# Patient Record
Sex: Female | Born: 1980 | Race: White | Hispanic: No | Marital: Married | Smoking: Never smoker
Health system: Southern US, Community
[De-identification: ages and names within clinical notes are randomized; demographics above are authoritative.]

## PROBLEM LIST (undated history)

## (undated) DIAGNOSIS — Z8619 Personal history of other infectious and parasitic diseases: Secondary | ICD-10-CM

## (undated) DIAGNOSIS — J45909 Unspecified asthma, uncomplicated: Secondary | ICD-10-CM

## (undated) DIAGNOSIS — Z87891 Personal history of nicotine dependence: Secondary | ICD-10-CM

## (undated) DIAGNOSIS — J189 Pneumonia, unspecified organism: Secondary | ICD-10-CM

## (undated) HISTORY — PX: WISDOM TOOTH EXTRACTION: SHX21

## (undated) HISTORY — DX: Unspecified asthma, uncomplicated: J45.909

## (undated) HISTORY — PX: NO PAST SURGERIES: SHX2092

## (undated) HISTORY — DX: Personal history of other infectious and parasitic diseases: Z86.19

---
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
## 2002-04-21 ENCOUNTER — Other Ambulatory Visit: Admission: RE | Admit: 2002-04-21 | Discharge: 2002-04-21 | Payer: Self-pay | Admitting: Obstetrics and Gynecology

## 2003-05-25 ENCOUNTER — Other Ambulatory Visit: Admission: RE | Admit: 2003-05-25 | Discharge: 2003-05-25 | Payer: Self-pay | Admitting: Obstetrics and Gynecology

## 2004-08-01 ENCOUNTER — Other Ambulatory Visit: Admission: RE | Admit: 2004-08-01 | Discharge: 2004-08-01 | Payer: Self-pay | Admitting: Obstetrics and Gynecology

## 2005-07-05 ENCOUNTER — Other Ambulatory Visit: Admission: RE | Admit: 2005-07-05 | Discharge: 2005-07-05 | Payer: Self-pay | Admitting: Obstetrics and Gynecology

## 2006-03-18 DIAGNOSIS — Z87891 Personal history of nicotine dependence: Secondary | ICD-10-CM

## 2006-03-18 HISTORY — DX: Personal history of nicotine dependence: Z87.891

## 2008-02-08 ENCOUNTER — Ambulatory Visit (HOSPITAL_COMMUNITY): Admission: RE | Admit: 2008-02-08 | Discharge: 2008-02-08 | Payer: Self-pay | Admitting: Obstetrics and Gynecology

## 2008-06-13 ENCOUNTER — Inpatient Hospital Stay (HOSPITAL_COMMUNITY): Admission: AD | Admit: 2008-06-13 | Discharge: 2008-06-13 | Payer: Self-pay | Admitting: Obstetrics and Gynecology

## 2008-08-27 ENCOUNTER — Inpatient Hospital Stay (HOSPITAL_COMMUNITY): Admission: AD | Admit: 2008-08-27 | Discharge: 2008-08-30 | Payer: Self-pay | Admitting: Obstetrics & Gynecology

## 2010-04-08 ENCOUNTER — Encounter: Payer: Self-pay | Admitting: Orthopaedic Surgery

## 2010-06-25 LAB — CBC
HCT: 34.5 % — ABNORMAL LOW (ref 36.0–46.0)
HCT: 38.4 % (ref 36.0–46.0)
Hemoglobin: 12.2 g/dL (ref 12.0–15.0)
Hemoglobin: 13.5 g/dL (ref 12.0–15.0)
MCHC: 35.1 g/dL (ref 30.0–36.0)
MCHC: 35.4 g/dL (ref 30.0–36.0)
MCV: 94 fL (ref 78.0–100.0)
MCV: 94.5 fL (ref 78.0–100.0)
Platelets: 166 10*3/uL (ref 150–400)
RBC: 3.65 MIL/uL — ABNORMAL LOW (ref 3.87–5.11)
RDW: 13.3 % (ref 11.5–15.5)
WBC: 12.2 10*3/uL — ABNORMAL HIGH (ref 4.0–10.5)
WBC: 14.2 10*3/uL — ABNORMAL HIGH (ref 4.0–10.5)

## 2010-06-25 LAB — RPR: RPR Ser Ql: NONREACTIVE

## 2010-06-25 LAB — CCBB MATERNAL DONOR DRAW

## 2010-06-28 LAB — URINALYSIS, ROUTINE W REFLEX MICROSCOPIC
Glucose, UA: NEGATIVE mg/dL
Hgb urine dipstick: NEGATIVE
Ketones, ur: NEGATIVE mg/dL
Protein, ur: NEGATIVE mg/dL
Specific Gravity, Urine: <1.005 — ABNORMAL LOW (ref 1.005–1.030)
Urobilinogen, UA: 0.2 mg/dL (ref 0.0–1.0)
pH: 6 (ref 5.0–8.0)

## 2010-07-30 ENCOUNTER — Inpatient Hospital Stay (HOSPITAL_COMMUNITY): Admission: AD | Admit: 2010-07-30 | Payer: Self-pay | Source: Ambulatory Visit | Admitting: Obstetrics and Gynecology

## 2010-08-02 ENCOUNTER — Inpatient Hospital Stay (HOSPITAL_COMMUNITY)
Admission: RE | Admit: 2010-08-02 | Discharge: 2010-08-04 | DRG: 775 | Disposition: A | Discharge status: Home / Self Care | Payer: PRIVATE HEALTH INSURANCE | Source: Ambulatory Visit | Attending: Obstetrics and Gynecology | Admitting: Obstetrics and Gynecology

## 2010-08-02 DIAGNOSIS — Z22330 Carrier of Group B streptococcus: Secondary | ICD-10-CM

## 2010-08-02 DIAGNOSIS — O480 Post-term pregnancy: Principal | ICD-10-CM | POA: Diagnosis present

## 2010-08-02 DIAGNOSIS — O99892 Other specified diseases and conditions complicating childbirth: Secondary | ICD-10-CM | POA: Diagnosis present

## 2010-08-02 LAB — CBC
HCT: 36.7 % (ref 36.0–46.0)
Hemoglobin: 12 g/dL (ref 12.0–15.0)
MCH: 30.4 pg (ref 26.0–34.0)
MCHC: 32.7 g/dL (ref 30.0–36.0)
RBC: 3.95 MIL/uL (ref 3.87–5.11)
RDW: 14.1 % (ref 11.5–15.5)
WBC: 10.4 10*3/uL (ref 4.0–10.5)

## 2010-08-02 LAB — RPR: RPR Ser Ql: NONREACTIVE

## 2010-08-03 LAB — CBC
HCT: 37.7 % (ref 36.0–46.0)
Hemoglobin: 12.1 g/dL (ref 12.0–15.0)
MCH: 29.9 pg (ref 26.0–34.0)
MCHC: 32.1 g/dL (ref 30.0–36.0)
Platelets: 256 10*3/uL (ref 150–400)
RBC: 4.05 MIL/uL (ref 3.87–5.11)
RDW: 14.1 % (ref 11.5–15.5)

## 2010-08-04 NOTE — Discharge Summary (Signed)
  Rachel Singleton, Rachel Singleton               ACCOUNT NO.:  [id number]  MEDICAL RECORD NO.:  [id number]           PATIENT TYPE:  I  LOCATION:  9145                          FACILITY:  [facility]  PHYSICIAN:  Gerrit Friends. Aldona Bar, M.D.   DATE OF BIRTH:  02/15/1981  DATE OF ADMISSION:  08/02/2010 DATE OF DISCHARGE:  08/04/2010                              DISCHARGE SUMMARY   DISCHARGE DIAGNOSES: 1. Term pregnancy delivered 8 pounds and 3 ounces female infant,     Apgars 6 and 8. 2. Blood type A+.  PROCEDURE:  Normal spontaneous delivery.  SUMMARY:  This gravida 2, now para 2, presented at Term and Labor, and progressed well, and subsequently had a normal spontaneous delivery of a viable female infant weighing 8 pounds and 3 ounces with Apgars of 6 and 8.  There were no tears.  Postpartum course was totally benign.  On the morning of Aug 04, 2010, the patient was ambulating well, breastfeeding without difficulty, having normal bowel and bladder function, was afebrile and was desirous of discharge.  Accordingly, she was given all appropriate instructions per discharge brochure and understood all instructions well.  Discharge medications include vitamins - one a day as long as she is breastfeeding.  Her hemoglobin was 12.1 with a white count of 14,100 and a platelet count of 256,000.  She was given prescriptions for Motrin 600 mg use every 6 hours as needed for cramping or mild pain and Tylox one to two every 4-6 hours as needed for more severe pain.  She will return to the office for followup in approximately 4 weeks' time or as needed.  CONDITION ON DISCHARGE:  Improved.     Gerrit Friends. Aldona Bar, M.D.     RMW/MEDQ  D:  08/04/2010  T:  08/04/2010  Job:  [id number]  Electronically Signed by Annamaria Helling M.D. on 08/04/2010 03:05:14 PM

## 2011-02-02 ENCOUNTER — Emergency Department (HOSPITAL_COMMUNITY): Payer: Worker's Compensation

## 2011-02-02 ENCOUNTER — Emergency Department (HOSPITAL_COMMUNITY)
Admission: EM | Admit: 2011-02-02 | Discharge: 2011-02-02 | Disposition: A | Discharge status: Home / Self Care | Payer: Worker's Compensation | Attending: Emergency Medicine | Admitting: Emergency Medicine

## 2011-02-02 ENCOUNTER — Encounter: Payer: Self-pay | Admitting: *Deleted

## 2011-02-02 DIAGNOSIS — Y9269 Other specified industrial and construction area as the place of occurrence of the external cause: Secondary | ICD-10-CM | POA: Insufficient documentation

## 2011-02-02 DIAGNOSIS — T148XXA Other injury of unspecified body region, initial encounter: Secondary | ICD-10-CM

## 2011-02-02 DIAGNOSIS — L089 Local infection of the skin and subcutaneous tissue, unspecified: Secondary | ICD-10-CM | POA: Insufficient documentation

## 2011-02-02 DIAGNOSIS — Y990 Civilian activity done for income or pay: Secondary | ICD-10-CM | POA: Insufficient documentation

## 2011-02-02 DIAGNOSIS — W268XXA Contact with other sharp object(s), not elsewhere classified, initial encounter: Secondary | ICD-10-CM | POA: Insufficient documentation

## 2011-02-02 MED ORDER — CLINDAMYCIN HCL 150 MG PO CAPS: 300.0000 mg | ORAL_CAPSULE | Freq: Three times a day (TID) | ORAL | Status: AC

## 2011-02-02 MED ORDER — TETANUS-DIPHTH-ACELL PERTUSSIS 5-2.5-18.5 LF-MCG/0.5 IM SUSP
0.5000 mL | Freq: Once | INTRAMUSCULAR | Status: AC
  Administered 2011-02-02: 0.5 mL via INTRAMUSCULAR
  Filled 2011-02-02: qty 0.5

## 2011-02-02 NOTE — ED Notes (Signed)
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 Wound healed over, redness and swelling present. Painful with palpation

## 2011-02-02 NOTE — ED Provider Notes (Signed)
History/physical exam/procedure(s) were performed by non-physician practitioner and as supervising physician I was immediately available for consultation/collaboration. I have reviewed all notes and am in agreement with care and plan.   Hilario Quarry, MD 02/02/11 [phone]

## 2011-02-02 NOTE — ED Provider Notes (Signed)
History     CSN: [id number] Arrival date & time: 02/02/2011 10:27 AM   First MD Initiated Contact with Patient 02/02/11 1112      Chief Complaint  Patient presents with  . Extremity Laceration    to left hand x1 week ago - pt believes glass may be in wound and it is now infected    (Consider location/radiation/quality/duration/timing/severity/associated sxs/prior treatment) The history is provided by the patient.   Rachel Singleton is a 30 year old female who presents with complaint of pain and swelling to the left palm. One week ago, she sustained a laceration to the same area when a beer mug broke in her hand. She cleaned the wound at that time but it has become progressively red and more painful. Pain does not radiate anywhere.It is throbbing in nature, moderate in severity. It is worse with palpation of the affected area. THere is no associated numbness, tingling, or weakness. There is no bleeding. The patient denies fever, chills, or a general feeling of being unwell. Her tetanus it out of date.  History reviewed. No pertinent past medical history.  History reviewed. No pertinent past surgical history.  History reviewed. No pertinent family history.  History  Substance Use Topics  . Smoking status: Not on file  . Smokeless tobacco: Not on file  . Alcohol Use: No     Review of Systems  Constitutional: Negative for fever and chills.  Skin: Positive for color change and wound. Negative for rash.  Neurological: Negative for weakness, numbness and headaches.  Hematological: Does not bruise/bleed easily.  All other systems reviewed and are negative.    Allergies  Review of patient's allergies indicates no known allergies.  Home Medications   Current Outpatient Rx  Name Route Sig Dispense Refill  . THERA M PLUS PO TABS Oral Take 1 tablet by mouth daily.        BP 118/81  Pulse 78  Temp(Src) 97.9 F (36.6 C) (Oral)  Resp 16  SpO2 100%  Physical Exam    Constitutional: She is oriented to person, place, and time. She appears well-developed and well-nourished. No distress.  HENT:  Head: Normocephalic and atraumatic.  Eyes: Pupils are equal, round, and reactive to light.  Neck: Normal range of motion. Neck supple.  Cardiovascular: Normal rate, regular rhythm and intact distal pulses.   Pulmonary/Chest: Effort normal and breath sounds normal.  Abdominal: Soft. She exhibits no distension. There is no tenderness.  Musculoskeletal: Normal range of motion. She exhibits no edema.       Moderate TTP to palmar aspect of left hand, proximal to index finger.  Neurological: She is alert and oriented to person, place, and time. No cranial nerve deficit.  Skin: Skin is warm and dry.       1.5cm area of erythema with healed wound to palmar aspect of left hand, proximal to index finger. No purulent drainage, no erythematous streaking up affected extremity.    ED Course  Procedures (including critical care time)  Labs Reviewed - No data to display Dg Hand Complete Left  02/02/2011  *RADIOLOGY REPORT*  Clinical Data: Laceration 1 week ago.  Question foreign body.  LEFT HAND - COMPLETE 3+ VIEW  Comparison: None.  Findings: No radiopaque foreign body is identified.  No fracture or dislocation is seen.  No soft tissue gas collection.  IMPRESSION: Negative for foreign body.  No acute finding.  Original Report Authenticated By: Bernadene Bell. D'ALESSIO, M.D.     1. Wound infection  MDM  Patient's x-rays and reviewed by myself there is no evidence of any foreign body. As the wound has already healed, I will not explore for foreign body. I do feel the patient has a superficial wound infection and cover for both staph and strep we'll treat her with clindamycin. Her tetanus shot was updated at this visit.        Rachel Singleton, [place] 02/02/11 1328

## 2011-02-02 NOTE — Discharge Instructions (Signed)
Wound Infection A wound infection happens when a type of germ (bacteria) starts growing in the wound. In some cases, this can cause the wound to break open. If cared for properly, the infected wound will heal from the inside to the outside. Wound infections need treatment. CAUSES An infection is caused by bacteria growing in the wound.  SYMPTOMS   Increase in redness, swelling, or pain at the wound site.   Increase in drainage at the wound site.   Wound or bandage (dressing) starts to smell bad.   Fever.   Feeling tired or fatigued.   Pus draining from the wound.  TREATMENT  You caregiver will prescribe antibiotic medicine. The wound infection should improve within 24 to 48 hours. Any redness around the wound should stop spreading and the wound should be less painful.  HOME CARE INSTRUCTIONS   Only take over-the-counter or prescription medicines for pain, discomfort, or fever as directed by your caregiver.   Take your antibiotics as directed. Finish them even if you start to feel better.   Gently wash the area with mild soap and water 2 times a day, or as directed. Rinse off the soap. Pat the area dry with a clean towel. Do not rub the wound. This may cause bleeding.   Follow your caregiver's instructions for how often you need to change the dressing.   Apply ointment and a dressing to the wound as directed.   If the dressing sticks, moisten it with soapy water and gently remove it.   Change the bandage right away if it becomes wet, dirty, or develops a bad smell.   Take showers. Do not take tub baths, swim, or do anything that may soak the wound until it is healed.   Avoid exercises that make you sweat heavily.   Use anti-itch medicine as directed by your caregiver. The wound may itch when it is healing. Do not pick or scratch at the wound.   Follow up with your caregiver to get your wound rechecked as directed.  SEEK MEDICAL CARE IF:  You have an increase in swelling,  pain, or redness around the wound.   You have an increase in the amount of pus coming from the wound.   There is a bad smell coming from the wound.   More of the wound breaks open.   You have a fever.  MAKE SURE YOU:   Understand these instructions.   Will watch your condition.   Will get help right away if you are not doing well or get worse.  Document Released: 12/01/2002 Document Revised: 11/14/2010 Document Reviewed: 07/08/2010 [employer] Patient Information 2012 [employer]. 

## 2011-10-14 LAB — OB RESULTS CONSOLE HEPATITIS B SURFACE ANTIGEN: Hepatitis B Surface Ag: NEGATIVE

## 2011-10-14 LAB — OB RESULTS CONSOLE ABO/RH: RH Type: POSITIVE

## 2011-10-14 LAB — OB RESULTS CONSOLE RPR: RPR: NONREACTIVE

## 2011-10-14 LAB — OB RESULTS CONSOLE HIV ANTIBODY (ROUTINE TESTING): HIV: NONREACTIVE

## 2011-10-14 LAB — OB RESULTS CONSOLE RUBELLA ANTIBODY, IGM: Rubella: IMMUNE

## 2011-10-14 LAB — OB RESULTS CONSOLE GC/CHLAMYDIA
Chlamydia: NEGATIVE
Gonorrhea: NEGATIVE

## 2012-03-18 NOTE — L&D Delivery Note (Signed)
Delivery Note At 3:49 PM a viable and healthy female was delivered via Vaginal, Spontaneous Delivery (Presentation: Right ; Occiput Anterior  ).  APGAR: 3, 7; weight 7 lb 9.2 oz (3435 g).   Placenta status: Intact, Spontaneous with partial abruption.  Cord: 3 vessels with the following complications: Velamentous cord insertion.  Cord pH: 7.13  I was called to see patient d/t fetal bradycardia.  Fetal heart tones declined to the 60s and did not recover despite position changes and supplemental oxygen.  The patients cervix  was initially 9 then quickly 10.  A kiwi vacuum was applied and with maternal pushing the infants head rotated from OP to OA and with 3 pushes was delivered to crowning.  The vacuum was disengaged and the infants body was delivered.  Bleeding c/w abruption was noted.  The infant was passed to the isolette and The NICU team was in attendance for resuscitation.  The Placenta delivered spontaneously.  A velamentous cord insertion with partial placental abruption was noted.  Cord pH was sent. No lacerations required repair.  Mother and baby are doing well after delivery.  Anesthesia: Epidural  Episiotomy: None Lacerations: None Suture Repair: None Est. Blood Loss (mL): 500cc  Mom to postpartum.  Baby to nursery-stable.  Rachel Kingsley H. 05/08/2012, 4:24 PM

## 2012-04-10 LAB — OB RESULTS CONSOLE GBS: GBS: NEGATIVE

## 2012-04-30 ENCOUNTER — Telehealth (HOSPITAL_COMMUNITY): Payer: Self-pay | Admitting: *Deleted

## 2012-04-30 ENCOUNTER — Encounter (HOSPITAL_COMMUNITY): Payer: Self-pay | Admitting: *Deleted

## 2012-04-30 NOTE — Telephone Encounter (Signed)
Preadmission screen  

## 2012-05-08 ENCOUNTER — Inpatient Hospital Stay (HOSPITAL_COMMUNITY): Payer: BC Managed Care – PPO | Admitting: Anesthesiology

## 2012-05-08 ENCOUNTER — Encounter (HOSPITAL_COMMUNITY): Payer: Self-pay | Admitting: Anesthesiology

## 2012-05-08 ENCOUNTER — Encounter (HOSPITAL_COMMUNITY): Payer: Self-pay

## 2012-05-08 ENCOUNTER — Inpatient Hospital Stay (HOSPITAL_COMMUNITY)
Admission: RE | Admit: 2012-05-08 | Discharge: 2012-05-10 | DRG: 372 | Disposition: A | Discharge status: Home / Self Care | Payer: BC Managed Care – PPO | Source: Ambulatory Visit | Attending: Obstetrics and Gynecology | Admitting: Obstetrics and Gynecology

## 2012-05-08 DIAGNOSIS — O4590 Premature separation of placenta, unspecified, unspecified trimester: Secondary | ICD-10-CM | POA: Diagnosis present

## 2012-05-08 LAB — CBC
HCT: 33.9 % — ABNORMAL LOW (ref 36.0–46.0)
MCH: 30.5 pg (ref 26.0–34.0)
MCHC: 33.3 g/dL (ref 30.0–36.0)
MCV: 91.6 fL (ref 78.0–100.0)
Platelets: 204 10*3/uL (ref 150–400)
RDW: 14.3 % (ref 11.5–15.5)
WBC: 7.6 10*3/uL (ref 4.0–10.5)

## 2012-05-08 LAB — TYPE AND SCREEN: Antibody Screen: NEGATIVE

## 2012-05-08 LAB — ABO/RH: ABO/RH(D): A POS

## 2012-05-08 MED ORDER — OXYTOCIN 40 UNITS IN LACTATED RINGERS INFUSION - SIMPLE MED: 62.5000 mL/h | INTRAVENOUS | Status: DC

## 2012-05-08 MED ORDER — ONDANSETRON HCL 4 MG/2ML IJ SOLN: 4.0000 mg | INTRAMUSCULAR | Status: DC | PRN

## 2012-05-08 MED ORDER — SENNOSIDES-DOCUSATE SODIUM 8.6-50 MG PO TABS
2.0000 | ORAL_TABLET | Freq: Every day | ORAL | Status: DC
  Administered 2012-05-08: 2 via ORAL

## 2012-05-08 MED ORDER — EPHEDRINE 5 MG/ML INJ: 10.0000 mg | INTRAVENOUS | Status: DC | PRN

## 2012-05-08 MED ORDER — WITCH HAZEL-GLYCERIN EX PADS: 1.0000 "application " | MEDICATED_PAD | CUTANEOUS | Status: DC | PRN

## 2012-05-08 MED ORDER — ONDANSETRON HCL 4 MG/2ML IJ SOLN: 4.0000 mg | Freq: Four times a day (QID) | INTRAMUSCULAR | Status: DC | PRN

## 2012-05-08 MED ORDER — METHYLERGONOVINE MALEATE 0.2 MG PO TABS: 0.2000 mg | ORAL_TABLET | ORAL | Status: DC | PRN

## 2012-05-08 MED ORDER — ZOLPIDEM TARTRATE 5 MG PO TABS: 5.0000 mg | ORAL_TABLET | Freq: Every evening | ORAL | Status: DC | PRN

## 2012-05-08 MED ORDER — ONDANSETRON HCL 4 MG PO TABS: 4.0000 mg | ORAL_TABLET | ORAL | Status: DC | PRN

## 2012-05-08 MED ORDER — OXYCODONE-ACETAMINOPHEN 5-325 MG PO TABS: 1.0000 | ORAL_TABLET | ORAL | Status: DC | PRN

## 2012-05-08 MED ORDER — EPHEDRINE 5 MG/ML INJ
10.0000 mg | INTRAVENOUS | Status: DC | PRN
  Filled 2012-05-08: qty 4

## 2012-05-08 MED ORDER — LIDOCAINE HCL (PF) 1 % IJ SOLN: 30.0000 mL | INTRAMUSCULAR | Status: DC | PRN

## 2012-05-08 MED ORDER — PHENYLEPHRINE 40 MCG/ML (10ML) SYRINGE FOR IV PUSH (FOR BLOOD PRESSURE SUPPORT)
80.0000 ug | PREFILLED_SYRINGE | INTRAVENOUS | Status: DC | PRN
  Filled 2012-05-08: qty 5

## 2012-05-08 MED ORDER — LIDOCAINE HCL (PF) 1 % IJ SOLN
INTRAMUSCULAR | Status: DC | PRN
  Administered 2012-05-08 (×4): 4 mL

## 2012-05-08 MED ORDER — BUTORPHANOL TARTRATE 1 MG/ML IJ SOLN: 1.0000 mg | INTRAMUSCULAR | Status: DC | PRN

## 2012-05-08 MED ORDER — LACTATED RINGERS IV SOLN
INTRAVENOUS | Status: DC
  Administered 2012-05-08 (×2): via INTRAVENOUS

## 2012-05-08 MED ORDER — AZITHROMYCIN 250 MG PO TABS
250.0000 mg | ORAL_TABLET | Freq: Every day | ORAL | Status: DC
  Administered 2012-05-09: 250 mg via ORAL
  Filled 2012-05-08 (×3): qty 1

## 2012-05-08 MED ORDER — ACETAMINOPHEN 325 MG PO TABS: 650.0000 mg | ORAL_TABLET | ORAL | Status: DC | PRN

## 2012-05-08 MED ORDER — IBUPROFEN 600 MG PO TABS: 600.0000 mg | ORAL_TABLET | Freq: Four times a day (QID) | ORAL | Status: DC | PRN

## 2012-05-08 MED ORDER — BENZOCAINE-MENTHOL 20-0.5 % EX AERO
1.0000 "application " | INHALATION_SPRAY | CUTANEOUS | Status: DC | PRN
  Administered 2012-05-09: 1 via TOPICAL
  Filled 2012-05-08: qty 56

## 2012-05-08 MED ORDER — IBUPROFEN 600 MG PO TABS
600.0000 mg | ORAL_TABLET | Freq: Four times a day (QID) | ORAL | Status: DC
  Administered 2012-05-08 – 2012-05-10 (×7): 600 mg via ORAL
  Filled 2012-05-08 (×7): qty 1

## 2012-05-08 MED ORDER — SIMETHICONE 80 MG PO CHEW: 80.0000 mg | CHEWABLE_TABLET | ORAL | Status: DC | PRN

## 2012-05-08 MED ORDER — PHENYLEPHRINE 40 MCG/ML (10ML) SYRINGE FOR IV PUSH (FOR BLOOD PRESSURE SUPPORT): 80.0000 ug | PREFILLED_SYRINGE | INTRAVENOUS | Status: DC | PRN

## 2012-05-08 MED ORDER — PRENATAL MULTIVITAMIN CH
1.0000 | ORAL_TABLET | Freq: Every day | ORAL | Status: DC
  Administered 2012-05-09 – 2012-05-10 (×2): 1 via ORAL
  Filled 2012-05-08 (×2): qty 1

## 2012-05-08 MED ORDER — OXYCODONE-ACETAMINOPHEN 5-325 MG PO TABS
1.0000 | ORAL_TABLET | ORAL | Status: DC | PRN
  Administered 2012-05-09 (×3): 1 via ORAL
  Filled 2012-05-08 (×3): qty 1

## 2012-05-08 MED ORDER — METHYLERGONOVINE MALEATE 0.2 MG/ML IJ SOLN: 0.2000 mg | INTRAMUSCULAR | Status: DC | PRN

## 2012-05-08 MED ORDER — AZITHROMYCIN 500 MG PO TABS
500.0000 mg | ORAL_TABLET | Freq: Once | ORAL | Status: AC
  Administered 2012-05-08: 500 mg via ORAL
  Filled 2012-05-08: qty 1

## 2012-05-08 MED ORDER — LANOLIN HYDROUS EX OINT: TOPICAL_OINTMENT | CUTANEOUS | Status: DC | PRN

## 2012-05-08 MED ORDER — TETANUS-DIPHTH-ACELL PERTUSSIS 5-2.5-18.5 LF-MCG/0.5 IM SUSP: 0.5000 mL | Freq: Once | INTRAMUSCULAR | Status: DC

## 2012-05-08 MED ORDER — CITRIC ACID-SODIUM CITRATE 334-500 MG/5ML PO SOLN: 30.0000 mL | ORAL | Status: DC | PRN

## 2012-05-08 MED ORDER — LACTATED RINGERS IV SOLN: 500.0000 mL | INTRAVENOUS | Status: DC | PRN

## 2012-05-08 MED ORDER — LACTATED RINGERS IV SOLN: 500.0000 mL | Freq: Once | INTRAVENOUS | Status: DC

## 2012-05-08 MED ORDER — FENTANYL 2.5 MCG/ML BUPIVACAINE 1/10 % EPIDURAL INFUSION (WH - ANES)
14.0000 mL/h | INTRAMUSCULAR | Status: DC
  Administered 2012-05-08: 14 mL/h via EPIDURAL
  Filled 2012-05-08: qty 125

## 2012-05-08 MED ORDER — TERBUTALINE SULFATE 1 MG/ML IJ SOLN: 0.2500 mg | Freq: Once | INTRAMUSCULAR | Status: DC | PRN

## 2012-05-08 MED ORDER — OXYTOCIN BOLUS FROM INFUSION: 500.0000 mL | INTRAVENOUS | Status: DC

## 2012-05-08 MED ORDER — OXYTOCIN 40 UNITS IN LACTATED RINGERS INFUSION - SIMPLE MED
1.0000 m[IU]/min | INTRAVENOUS | Status: DC
  Administered 2012-05-08: 2 m[IU]/min via INTRAVENOUS
  Filled 2012-05-08: qty 1000

## 2012-05-08 MED ORDER — DIBUCAINE 1 % RE OINT: 1.0000 "application " | TOPICAL_OINTMENT | RECTAL | Status: DC | PRN

## 2012-05-08 MED ORDER — DIPHENHYDRAMINE HCL 25 MG PO CAPS: 25.0000 mg | ORAL_CAPSULE | Freq: Four times a day (QID) | ORAL | Status: DC | PRN

## 2012-05-08 MED ORDER — DIPHENHYDRAMINE HCL 50 MG/ML IJ SOLN: 12.5000 mg | INTRAMUSCULAR | Status: DC | PRN

## 2012-05-08 NOTE — Consult Note (Signed)
Neonatology Note:  Attendance at Code Apgar:  Our team responded to a Code Apgar call to room # 164 following vacuum-assisted delivery for NRFHR, due to infant with apnea. The mother is a G3P2 A pos, GBS neg with an uncomplicated pregnancy. ROM occurred 1 hour PTD and the fluid was bloody. It was felt that there was a placental abruption with velamentous insertion of the cord. There had been some FHR decelerations. At delivery, the baby was floppy with a HR of 60 and apnea. The OB nursing staff in attendance gave vigorous stimulation and a Code Apgar was called. Our team arrived at 1 minute of life, at which time the baby appeared stunned, dusky, poorly-perfused, but with HR > 100 and starting to take some breaths on her own. We placed a pulse oximeter, gave BBO2, and provided stimulation eo encourage crying. She gradually improved with color, perfusion, and muscle tone. Ap 3/7/9. Tone was normal by 7-8 minutes and her perfusion was good. Lungs were clear and there was no resp distress. I spoke with the parents in the DR, then transferred the baby to the Pediatrician's care.  Rachel Matkins C. Cache Bills, MD  

## 2012-05-08 NOTE — Anesthesia Preprocedure Evaluation (Signed)
Anesthesia Evaluation  Patient identified by MRN, date of birth, ID band Patient awake    Reviewed: Allergy & Precautions, H&P , NPO status , Patient's Chart, lab work & pertinent test results, reviewed documented beta blocker date and time   History of Anesthesia Complications Negative for: history of anesthetic complications  Airway Mallampati: II TM Distance: >3 FB Neck ROM: full    Dental  (+) Teeth Intact   Pulmonary asthma (pregnancy only) ,  breath sounds clear to auscultation        Cardiovascular negative cardio ROS  Rhythm:regular Rate:Normal     Neuro/Psych negative neurological ROS  negative psych ROS   GI/Hepatic negative GI ROS, Neg liver ROS,   Endo/Other  negative endocrine ROS  Renal/GU negative Renal ROS     Musculoskeletal   Abdominal   Peds  Hematology negative hematology ROS (+)   Anesthesia Other Findings   Reproductive/Obstetrics (+) Pregnancy                           Anesthesia Physical Anesthesia Plan  ASA: II  Anesthesia Plan: Epidural   Post-op Pain Management:    Induction:   Airway Management Planned:   Additional Equipment:   Intra-op Plan:   Post-operative Plan:   Informed Consent: I have reviewed the patients History and Physical, chart, labs and discussed the procedure including the risks, benefits and alternatives for the proposed anesthesia with the patient or authorized representative who has indicated his/her understanding and acceptance.     Plan Discussed with:   Anesthesia Plan Comments:         Anesthesia Quick Evaluation

## 2012-05-08 NOTE — Anesthesia Procedure Notes (Signed)
Epidural Patient location during procedure: OB Start time: 05/08/2012 11:53 AM  Staffing Performed by: anesthesiologist   Preanesthetic Checklist Completed: patient identified, site marked, surgical consent, pre-op evaluation, timeout performed, IV checked, risks and benefits discussed and monitors and equipment checked  Epidural Patient position: sitting Prep: site prepped and draped and DuraPrep Patient monitoring: continuous pulse ox and blood pressure Approach: midline Injection technique: LOR air  Needle:  Needle type: Tuohy  Needle gauge: 17 G Needle length: 9 cm and 9 Needle insertion depth: 4.5 cm Catheter type: closed end flexible Catheter size: 19 Gauge Catheter at skin depth: 9.5 cm Test dose: negative  Assessment Events: blood not aspirated, injection not painful, no injection resistance, negative IV test and no paresthesia  Additional Notes Discussed risk of headache, infection, bleeding, nerve injury and failed or incomplete block.  Patient voices understanding and wishes to proceed.  Epidural placed easily on first attempt.  No paresthesia.  Patient tolerated procedure well with no apparent complications.  Jasmine December, MDReason for block:procedure for pain

## 2012-05-08 NOTE — H&P (Signed)
FIZZA SCALES is a 32 y.o. female presenting for IOL  32 yo G3P2002 @ 39+3 presents for IOL.  Pregnancy has been uncomplicated to this point. In the office her cervix was 3-4/50/-2.  This was reconfirmed this morning. History OB History   Grav Para Term Preterm Abortions TAB SAB Ect Mult Living   3 2 2       2      Past Medical History  Diagnosis Date  . Hx of varicella   . Asthma     pregnancy only   Past Surgical History  Procedure Laterality Date  . No past surgeries     Family History: family history includes Cystic fibrosis in her brother; Heart attack in her father; and Heart disease in her father. Social History:  reports that she has never smoked. She has never used smokeless tobacco. She reports that she does not drink alcohol or use illicit drugs.   Prenatal Transfer Tool  Maternal Diabetes: No Genetic Screening: Normal Maternal Ultrasounds/Referrals: Normal Fetal Ultrasounds or other Referrals:  None Maternal Substance Abuse:  No Significant Maternal Medications:  None Significant Maternal Lab Results:  None Other Comments:  None  ROS: as above  Dilation: 3.5 Effacement (%): 50 Station: -2 Exam by:: Silvio Sausedo Blood pressure 113/69, pulse 103, temperature 98.2 F (36.8 C), temperature source Oral, height 5\' 7"  (1.702 m), weight 81.647 kg (180 lb). Exam Physical Exam  Prenatal labs: ABO, Rh: A/Positive/-- (07/29 0000) Antibody: Negative (07/29 0000) Rubella: Immune (07/29 0000) RPR: Nonreactive (07/29 0000)  HBsAg: Negative (07/29 0000)  HIV: Non-reactive (07/29 0000)  GBS: Negative (01/24 0000)   Assessment/Plan: 1) Admit 2) Pitocin 3) Epidural on request   Onofrio Klemp H. 05/08/2012, 9:16 AM

## 2012-05-09 LAB — CBC
HCT: 29.2 % — ABNORMAL LOW (ref 36.0–46.0)
Hemoglobin: 9.8 g/dL — ABNORMAL LOW (ref 12.0–15.0)
MCH: 30.3 pg (ref 26.0–34.0)
MCHC: 33.6 g/dL (ref 30.0–36.0)
Platelets: 199 10*3/uL (ref 150–400)
RDW: 14 % (ref 11.5–15.5)
WBC: 10.4 10*3/uL (ref 4.0–10.5)

## 2012-05-09 MED ORDER — IBUPROFEN 600 MG PO TABS: 600.0000 mg | ORAL_TABLET | Freq: Four times a day (QID) | ORAL | Status: DC | PRN

## 2012-05-09 MED ORDER — HYDROCODONE-ACETAMINOPHEN 5-500 MG PO TABS: 1.0000 | ORAL_TABLET | ORAL | Status: DC | PRN

## 2012-05-09 MED ORDER — AZITHROMYCIN 250 MG PO TABS: ORAL_TABLET | ORAL | Status: DC

## 2012-05-09 MED ORDER — DOCUSATE SODIUM 100 MG PO CAPS: 100.0000 mg | ORAL_CAPSULE | Freq: Two times a day (BID) | ORAL | Status: DC

## 2012-05-09 NOTE — Discharge Summary (Signed)
Obstetric Discharge Summary Reason for Admission: induction of labor Prenatal Procedures: ultrasound Intrapartum Procedures: spontaneous vaginal delivery and vacuum Postpartum Procedures: none Complications-Operative and Postpartum: none Hemoglobin  Date Value Range Status  05/09/2012 9.8* 12.0 - 15.0 g/dL Final     HCT  Date Value Range Status  05/09/2012 29.2* 36.0 - 46.0 % Final    Physical Exam:  General: alert, cooperative and appears stated age 32: appropriate Uterine Fundus: firm  Discharge Diagnoses: Term Pregnancy-delivered and partial abruption with velamentous cord insertion  Discharge Information: Date: 05/09/2012 Activity: pelvic rest Diet: routine Medications: Ibuprofen, Colace and Vicodin Condition: improved Instructions: refer to practice specific booklet Discharge to: home Follow-up Information   Follow up with Almon Hercules., MD In 4 weeks. (For a postpartum evaluation)    Contact information:   [address] [place] [address] [phone]       Newborn Data: Live born female  Birth Weight: 7 lb 9.2 oz (3435 g) APGAR: 3, 7  Home with mother.  Rachel Carmen H. 05/09/2012, 9:19 AM

## 2012-05-09 NOTE — Anesthesia Postprocedure Evaluation (Signed)
  Anesthesia Post-op Note  Patient: Rachel Singleton  Procedure(s) Performed: * No procedures listed *  Patient Location: Mother/Baby  Anesthesia Type:Epidural  Level of Consciousness: awake, alert  and oriented  Airway and Oxygen Therapy: Patient Spontanous Breathing  Post-op Pain: mild  Post-op Assessment: Post-op Vital signs reviewed and Patient's Cardiovascular Status Stable  Post-op Vital Signs: Reviewed and stable  Complications: No apparent anesthesia complications

## 2013-01-07 ENCOUNTER — Other Ambulatory Visit: Payer: Self-pay | Admitting: Obstetrics and Gynecology

## 2013-01-31 ENCOUNTER — Emergency Department (HOSPITAL_COMMUNITY): Payer: Self-pay

## 2013-01-31 ENCOUNTER — Inpatient Hospital Stay (HOSPITAL_COMMUNITY)
Admission: EM | Admit: 2013-01-31 | Discharge: 2013-02-02 | DRG: 603 | Disposition: A | Discharge status: Home / Self Care | Payer: Self-pay | Attending: Family Medicine | Admitting: Family Medicine

## 2013-01-31 ENCOUNTER — Encounter (HOSPITAL_COMMUNITY): Payer: Self-pay | Admitting: Emergency Medicine

## 2013-01-31 DIAGNOSIS — Z791 Long term (current) use of non-steroidal anti-inflammatories (NSAID): Secondary | ICD-10-CM

## 2013-01-31 DIAGNOSIS — J45909 Unspecified asthma, uncomplicated: Secondary | ICD-10-CM | POA: Diagnosis present

## 2013-01-31 DIAGNOSIS — M7040 Prepatellar bursitis, unspecified knee: Secondary | ICD-10-CM | POA: Diagnosis present

## 2013-01-31 DIAGNOSIS — L02419 Cutaneous abscess of limb, unspecified: Principal | ICD-10-CM

## 2013-01-31 DIAGNOSIS — Z79899 Other long term (current) drug therapy: Secondary | ICD-10-CM

## 2013-01-31 DIAGNOSIS — L0390 Cellulitis, unspecified: Secondary | ICD-10-CM

## 2013-01-31 LAB — CBC WITH DIFFERENTIAL/PLATELET
Basophils Absolute: 0 10*3/uL (ref 0.0–0.1)
Eosinophils Relative: 3 % (ref 0–5)
HCT: 34.1 % — ABNORMAL LOW (ref 36.0–46.0)
Hemoglobin: 11.6 g/dL — ABNORMAL LOW (ref 12.0–15.0)
Lymphocytes Relative: 18 % (ref 12–46)
Lymphs Abs: 1.6 10*3/uL (ref 0.7–4.0)
MCV: 85.3 fL (ref 78.0–100.0)
Monocytes Absolute: 1 10*3/uL (ref 0.1–1.0)
Monocytes Relative: 12 % (ref 3–12)
Neutro Abs: 5.8 10*3/uL (ref 1.7–7.7)
Neutrophils Relative %: 67 % (ref 43–77)
Platelets: 239 10*3/uL (ref 150–400)
RBC: 4 MIL/uL (ref 3.87–5.11)
RDW: 13.1 % (ref 11.5–15.5)
WBC: 8.7 10*3/uL (ref 4.0–10.5)

## 2013-01-31 LAB — BASIC METABOLIC PANEL
BUN: 10 mg/dL (ref 6–23)
CO2: 24 mEq/L (ref 19–32)
Calcium: 9.2 mg/dL (ref 8.4–10.5)
Chloride: 104 mEq/L (ref 96–112)
Glucose, Bld: 89 mg/dL (ref 70–99)
Potassium: 3.6 mEq/L (ref 3.5–5.1)
Sodium: 139 mEq/L (ref 135–145)

## 2013-01-31 MED ORDER — PIPERACILLIN-TAZOBACTAM 3.375 G IVPB
3.3750 g | Freq: Once | INTRAVENOUS | Status: AC
  Administered 2013-01-31: 3.375 g via INTRAVENOUS
  Filled 2013-01-31: qty 50

## 2013-01-31 MED ORDER — ONDANSETRON HCL 4 MG PO TABS: 4.0000 mg | ORAL_TABLET | Freq: Four times a day (QID) | ORAL | Status: DC | PRN

## 2013-01-31 MED ORDER — ONDANSETRON HCL 4 MG/2ML IJ SOLN: 4.0000 mg | Freq: Four times a day (QID) | INTRAMUSCULAR | Status: DC | PRN

## 2013-01-31 MED ORDER — IBUPROFEN 600 MG PO TABS
600.0000 mg | ORAL_TABLET | Freq: Four times a day (QID) | ORAL | Status: DC | PRN
  Administered 2013-02-01: 600 mg via ORAL
  Filled 2013-01-31 (×2): qty 1

## 2013-01-31 MED ORDER — ONDANSETRON HCL 4 MG/2ML IJ SOLN: 4.0000 mg | Freq: Three times a day (TID) | INTRAMUSCULAR | Status: DC | PRN

## 2013-01-31 MED ORDER — ADULT MULTIVITAMIN W/MINERALS CH
1.0000 | ORAL_TABLET | Freq: Every day | ORAL | Status: DC
  Administered 2013-01-31 – 2013-02-02 (×3): 1 via ORAL
  Filled 2013-01-31 (×3): qty 1

## 2013-01-31 MED ORDER — VANCOMYCIN HCL IN DEXTROSE 1-5 GM/200ML-% IV SOLN
1000.0000 mg | Freq: Two times a day (BID) | INTRAVENOUS | Status: DC
  Administered 2013-01-31 – 2013-02-02 (×4): 1000 mg via INTRAVENOUS
  Filled 2013-01-31 (×4): qty 200

## 2013-01-31 MED ORDER — CLINDAMYCIN PHOSPHATE 600 MG/50ML IV SOLN
600.0000 mg | Freq: Once | INTRAVENOUS | Status: AC
  Administered 2013-01-31: 600 mg via INTRAVENOUS
  Filled 2013-01-31: qty 50

## 2013-01-31 MED ORDER — PIPERACILLIN-TAZOBACTAM 3.375 G IVPB
3.3750 g | Freq: Three times a day (TID) | INTRAVENOUS | Status: DC
  Administered 2013-02-01 – 2013-02-02 (×4): 3.375 g via INTRAVENOUS
  Filled 2013-01-31 (×6): qty 50

## 2013-01-31 NOTE — ED Notes (Signed)
Report called to Kim, RN on 6E 

## 2013-01-31 NOTE — Progress Notes (Signed)
ANTIBIOTIC CONSULT NOTE - INITIAL  Pharmacy Consult for Vancomycin and Zosyn Indication: Cellulitis  No Known Allergies  Patient Measurements: Height: 5\' 7"  (170.2 cm) Weight: 130 lb (58.968 kg) (per patient) IBW/kg (Calculated) : 61.6  Vital Signs: Temp: 98.4 F (36.9 C) (11/16 1600) Temp src: Oral (11/16 1600) Pulse Rate: 94 (11/16 1600) Intake/Output from previous day:   Intake/Output from this shift:    Labs:  Recent Labs  01/31/13 1641  WBC 8.7  HGB 11.6*  PLT 239  CREATININE 0.59   Estimated Creatinine Clearance: 93.2 ml/min (by C-G formula based on Cr of 0.59). 100 ml/min/1.71m2 (normalized)  Microbiology: No results found for this or any previous visit (from the past 720 hour(s)).  Medical History: Past Medical History  Diagnosis Date  . Hx of varicella   . Asthma     pregnancy only    Medications:  Scheduled:  . multivitamin with minerals  1 tablet Oral Daily  . piperacillin-tazobactam (ZOSYN)  IV  3.375 g Intravenous Once  . [START ON 02/01/2013] piperacillin-tazobactam (ZOSYN)  IV  3.375 g Intravenous Q8H  . vancomycin  1,000 mg Intravenous Q12H   Infusions:   Assessment: 32 yo breastfeeding female presents with redness and warmth to right knee onset 4 days ago. She was started on Augmentin as outpatient. Now beginning broad spectrum antibiotics with Vancomycin and Zosyn, plan transition to oral clindamycin tomorrow.   Goal of Therapy:  Vancomycin trough level 10-15 mcg/ml  Plan:   Vancomycin 1g IV q12h - check trough at steady state Zosyn 3.375gm IV q8h (4hr extended infusions) Follow up renal function & cultures (if ordered)  Loralee Pacas, PharmD, BCPS Pager: [phone] 01/31/2013,8:11 PM

## 2013-01-31 NOTE — ED Notes (Signed)
Pt c/o of knee infection and was seen by Dr Ranell Patrick.pt was instructed to go to a ER for IV antibiotics

## 2013-01-31 NOTE — ED Provider Notes (Signed)
CSN: [id number]     Arrival date & time 01/31/13  1557 History   First MD Initiated Contact with Patient 01/31/13 1619     Chief Complaint  Patient presents with  . Knee Pain    infection   (Consider location/radiation/quality/duration/timing/severity/associated sxs/prior Treatment) HPI  Rachel Singleton is a 32 y.o. female complaining of complaining of knee painful redness and warmth to right knee onset 4 days ago. Orthopedist, Dr. Ranell Patrick started her on Augmentin x4 days ago. Pt denies trauma or inset bite. Initially, the swelling and pain gradually improved over the course of days, but it has worsened today. She states that today redness is moving down to her legs and experiencing some numbness. Bending and walking makes her pain worse and lying makes it better. She describes pain on R lateral knee and taking ibuprofen 600mg  every 6 to 8 hours. She has not had any lab work or x-ray. She denies fever, chills, HA, dizziness, nausea, vomiting or rash.  Past Medical History  Diagnosis Date  . Hx of varicella   . Asthma     pregnancy only   Past Surgical History  Procedure Laterality Date  . No past surgeries     Family History  Problem Relation Age of Onset  . Heart disease Father   . Heart attack Father   . Cystic fibrosis Brother    History  Substance Use Topics  . Smoking status: Never Smoker   . Smokeless tobacco: Never Used  . Alcohol Use: No   OB History   Grav Para Term Preterm Abortions TAB SAB Ect Mult Living   3 3 3       3      Review of Systems 10 systems reviewed and found to be negative, except as noted in the HPI   Allergies  Review of patient's allergies indicates no known allergies.  Home Medications   Current Outpatient Rx  Name  Route  Sig  Dispense  Refill  . amoxicillin-clavulanate (AUGMENTIN) 500-125 MG per tablet   Oral   Take 1 tablet by mouth 3 (three) times daily.         Marland Kitchen ibuprofen (ADVIL,MOTRIN) 600 MG tablet   Oral   Take 1 tablet  (600 mg total) by mouth every 6 (six) hours as needed for pain.   90 tablet   0   . Multiple Vitamins-Minerals (MULTIVITAMINS THER. W/MINERALS) TABS   Oral   Take 1 tablet by mouth daily.            Pulse 94  Temp(Src) 98.4 F (36.9 C) (Oral)  Resp 18  SpO2 97% Physical Exam  Nursing note and vitals reviewed. Constitutional: She is oriented to person, place, and time. She appears well-developed and well-nourished. No distress.  HENT:  Head: Normocephalic.  Eyes: Conjunctivae and EOM are normal.  Cardiovascular: Normal rate.   Pulmonary/Chest: Effort normal and breath sounds normal. No stridor.  Abdominal: Soft. Bowel sounds are normal.  Musculoskeletal: Normal range of motion.  Right knee with full range of motion active and passively, no pain with movement. There is a prepatellar swelling it may be a bursitis.  No calf asymmetry, superficial collaterals, palpable cords, edema, Homans sign negative bilaterally.    Neurological: She is alert and oriented to person, place, and time.  Skin:  Induration to anterior right knee with streaking peripherally and proximally  Psychiatric: She has a normal mood and affect.        ED Course  Procedures (including  critical care time) Labs Review Labs Reviewed  CBC WITH DIFFERENTIAL - Abnormal; Notable for the following:    Hemoglobin 11.6 (*)    HCT 34.1 (*)    All other components within normal limits  BASIC METABOLIC PANEL   Imaging Review Dg Knee Complete 4 Views Right  01/31/2013   CLINICAL DATA:  Right knee pain for 4 days, no trauma  EXAM: RIGHT KNEE - COMPLETE 4+ VIEW  COMPARISON:  None.  FINDINGS: There is no evidence of fracture, dislocation, or joint effusion. There is no evidence of arthropathy or other focal bone abnormality. Mild prepatellar soft tissue swelling is evident.  IMPRESSION: Mild prepatellar soft tissue swelling is noted. This could indicate bursitis or cellulitis.   Electronically Signed   By: Christiana Pellant M.D.   On: 01/31/2013 17:15    EKG Interpretation   None       MDM   1. Cellulitis and abscess of leg, except foot     Filed Vitals:   01/31/13 1600  Pulse: 94  Temp: 98.4 F (36.9 C)  TempSrc: Oral  Resp: 18  SpO2: 97%     Rachel Singleton is a 32 y.o. female who has failed outpatient therapy for right knee cellulitis versus bursitis. Doubt septic joint is stable as patient is able to fully articulate the knee without pain. Patient has no systemic signs of infection, no fever nausea or vomiting. She will be started on clindamycin IV. I have advised the patient that she will need to pump and dump. [facility] be [facility] to [facility] Dr. Benjamine Mola  Medications  clindamycin (CLEOCIN) IVPB 600 mg (600 mg Intravenous New Bag/Given 01/31/13 1711)    Note: Portions of this report may have been transcribed using voice recognition software. Every effort was made to ensure accuracy; however, inadvertent computerized transcription errors may be present      Wynetta Emery, PA-C 01/31/13 1815

## 2013-01-31 NOTE — H&P (Signed)
[facility] History and Physical  [name]:[id number] DOB: 1981/03/16 DOA: 01/31/2013  Referring physician: er PCP: Almon Hercules., MD  Specialists:   Chief Complaint: celultitis  HPI: Rachel Singleton is a 32 y.o. female  Who is complaining of knee painful redness and warmth to right knee onset 4 days ago. Woke up and it was hurting and red.  Saw Dr. Ranell Patrick who started her on Augmentin x4 days ago. Denies inset bite. Had some rauma to her heel few days before. No fevers but she has been taking ibuprofen for pain.  She has had body aches.  Initially, the swelling and pain gradually improved over the course of days, but it has worsened today. She states that today redness is moving down to her legs and experiencing some numbness. Bending and walking makes her pain worse and lying makes it better.   She is breastfeeding currently.  Not an avid runner or exerciser.   In the ER, no WBC but failed outpatient augmentin therapy   Review of Systems: all systems reviewed, negative unless stated above   Past Medical History  Diagnosis Date  . Hx of varicella   . Asthma     pregnancy only   Past Surgical History  Procedure Laterality Date  . No past surgeries     Social History:  reports that she has never smoked. She has never used smokeless tobacco. She reports that she does not drink alcohol or use illicit drugs.  No Known Allergies  Family History  Problem Relation Age of Onset  . Heart disease Father   . Heart attack Father   . Cystic fibrosis Brother     Prior to Admission medications   Medication Sig Start Date End Date Taking? Authorizing Provider  amoxicillin-clavulanate (AUGMENTIN) 500-125 MG per tablet Take 1 tablet by mouth 3 (three) times daily.   Yes Historical Provider, MD  ibuprofen (ADVIL,MOTRIN) 600 MG tablet Take 1 tablet (600 mg total) by mouth every 6 (six) hours as needed for pain. 05/09/12  Yes [name] H. Tenny Craw, MD  Multiple Vitamins-Minerals  (MULTIVITAMINS THER. W/MINERALS) TABS Take 1 tablet by mouth daily.     Yes Historical Provider, MD   Physical Exam: Filed Vitals:   01/31/13 1600  Pulse: 94  Temp: 98.4 F (36.9 C)  Resp: 18    Constitutional: She is oriented to person, place, and time. She appears well-developed and well-nourished. No distress.  HENT:  Head: Normocephalic.  Eyes: Conjunctivae and EOM are normal.  Cardiovascular: Normal rate.  Pulmonary/Chest: Effort normal and breath sounds normal. No stridor.  Abdominal: Soft. Bowel sounds are normal.  Musculoskeletal: Normal range of motion.  Right knee with full range of motion active and passively, no pain with movement.  No calf asymmetry, superficial collaterals, palpable cords, edema, Homans sign negative bilaterally.  Neurological: She is alert and oriented to person, place, and time.  Skin: Induration to anterior right knee with streaking peripherally and proximally  Psychiatric: She has a normal mood and affect.   Labs on Admission:  Basic Metabolic Panel:  Recent Labs Lab 01/31/13 1641  NA 139  K 3.6  CL 104  CO2 24  GLUCOSE 89  BUN 10  CREATININE 0.59  CALCIUM 9.2   Liver Function Tests: No results found for this basename: AST, ALT, ALKPHOS, BILITOT, PROT, ALBUMIN,  in the last 168 hours No results found for this basename: LIPASE, AMYLASE,  in the last 168 hours No results found for this basename: AMMONIA,  in the last 168 hours CBC:  Recent Labs Lab 01/31/13 1641  WBC 8.7  NEUTROABS 5.8  HGB 11.6*  HCT 34.1*  MCV 85.3  PLT 239   Cardiac Enzymes: No results found for this basename: CKTOTAL, CKMB, CKMBINDEX, TROPONINI,  in the last 168 hours  BNP (last 3 results) No results found for this basename: PROBNP,  in the last 8760 hours CBG: No results found for this basename: GLUCAP,  in the last 168 hours  Radiological Exams on Admission: Dg Knee Complete 4 Views Right  01/31/2013   CLINICAL DATA:  Right knee pain for 4 days,  no trauma  EXAM: RIGHT KNEE - COMPLETE 4+ VIEW  COMPARISON:  None.  FINDINGS: There is no evidence of fracture, dislocation, or joint effusion. There is no evidence of arthropathy or other focal bone abnormality. Mild prepatellar soft tissue swelling is evident.  IMPRESSION: Mild prepatellar soft tissue swelling is noted. This could indicate bursitis or cellulitis.   Electronically Signed   By: Christiana Pellant M.D.   On: 01/31/2013 17:15     Assessment/Plan Active Problems:   Cellulitis   Breast feeding status of mother   1. Cellulitis- IV vanc/zosyn- change to clinda in AM if improvement, no WBC- has been taking ATC ibuprofen- + body aches; monitor overnight hope for d/c in AM  2. Breastfeeding- will need to pump and dump while on abx- patient instructed    Code Status: full Family Communication: patient Disposition Plan: obs  Time spent: 75 min  ,  [facility] Pager [phone]  If 7PM-7AM, please contact night-coverage [email or web address] Password [email or web address] 01/31/2013, 6:35 PM

## 2013-02-01 DIAGNOSIS — R509 Fever, unspecified: Secondary | ICD-10-CM

## 2013-02-01 DIAGNOSIS — M25469 Effusion, unspecified knee: Secondary | ICD-10-CM

## 2013-02-01 DIAGNOSIS — L539 Erythematous condition, unspecified: Secondary | ICD-10-CM

## 2013-02-01 DIAGNOSIS — L02419 Cutaneous abscess of limb, unspecified: Secondary | ICD-10-CM | POA: Diagnosis present

## 2013-02-01 LAB — CBC
HCT: 35.7 % — ABNORMAL LOW (ref 36.0–46.0)
Hemoglobin: 11.9 g/dL — ABNORMAL LOW (ref 12.0–15.0)
MCH: 28.7 pg (ref 26.0–34.0)
MCV: 86 fL (ref 78.0–100.0)
Platelets: 231 10*3/uL (ref 150–400)
RDW: 13.1 % (ref 11.5–15.5)
WBC: 7.4 10*3/uL (ref 4.0–10.5)

## 2013-02-01 LAB — BASIC METABOLIC PANEL
CO2: 26 mEq/L (ref 19–32)
Calcium: 9.2 mg/dL (ref 8.4–10.5)
Chloride: 106 mEq/L (ref 96–112)
Creatinine, Ser: 0.68 mg/dL (ref 0.50–1.10)
GFR calc Af Amer: >90 mL/min (ref >90)
GFR calc non Af Amer: >90 mL/min (ref >90)
Glucose, Bld: 98 mg/dL (ref 70–99)
Potassium: 3.9 mEq/L (ref 3.5–5.1)
Sodium: 142 mEq/L (ref 135–145)

## 2013-02-01 MED ORDER — LIDOCAINE HCL (PF) 1 % IJ SOLN
30.0000 mL | Freq: Once | INTRAMUSCULAR | Status: AC
  Administered 2013-02-01: 30 mL
  Filled 2013-02-01: qty 30

## 2013-02-01 NOTE — Progress Notes (Signed)
UR completed.  Patient changed to IP status r/t IV antibiotics

## 2013-02-01 NOTE — Progress Notes (Signed)
Ortho Progress/Procedure Note:  This patient is known to me and I have conferred with Dr Shon Baton.  Patient with large swollen infected right knee prepatellar bursa.  I discussed with the patient that we could aspirate the bursa and obtain a specimen for culture and it may help her feel better.  She consented to this.  5cc of yellowish fluid aspirated from the bursa and sent for Gram Stain and Aerobic and Anaerobic culture.  Compressive bandage applied.  Will ice and immobilize.  Jonni Sanger, MD

## 2013-02-01 NOTE — Care Management Note (Addendum)
    Page 1 of 1   02/02/2013     5:25:01 PM   CARE MANAGEMENT NOTE 02/02/2013  Patient:  Rachel Singleton, Rachel Singleton   Account Number:  [id number]  Date Initiated:  02/01/2013  Documentation initiated by:  Colleen Can  Subjective/Objective Assessment:   dx knee infection rt    Health care is covered through [facility].     Action/Plan:   Cm spoke with patient. Plans are for her to return to her home where she will have assistance from family members. NO dme needs. No orders for [facility] currently. CM will follow   Anticipated DC Date:  02/02/2013   Anticipated DC Plan:  HOME/SELF CARE      DC Planning Services  CM consult      Choice offered to / List presented to:             Status of service:  Completed, signed off Medicare Important Message given?   (If response is "NO", the following Medicare IM given date fields will be blank) Date Medicare IM given:   Date Additional Medicare IM given:    Discharge Disposition:  HOME/SELF CARE  Per UR Regulation:  Reviewed for med. necessity/level of care/duration of stay  If discussed at Long Length of Stay Meetings, dates discussed:    Comments:  02/02/2013 Colleen Can BSN RN CCM [phone] Pt discharging today to home. There are no HH or DME needs.

## 2013-02-01 NOTE — Consult Note (Signed)
Agree with above. No significant pain with ROM of the knee.  Unlikely a septic joint. Consider aspiration of bursa in the morning if swelling persists Patient has been on IV and oral abxs prior to consult - unlikely that cx will be beneficial at this point Question short course of IV abx via PICC line vs oral abxs.  Will defer to ID consult

## 2013-02-01 NOTE — Consult Note (Signed)
[facility]  Total days of antibiotics 2        Day 2 vanco        Day 2 piptazo        (1 day of clinda)       Reason for Consult: cellulitis of knee/prepatellar bursitis/abscess    Referring Physician: Laural Benes  Active Problems:   Cellulitis   Breast feeding status of mother   Prepatellar abscess    HPI: Rachel Singleton is a 32 y.o. female with past medical history of ashtma, currently breastfeeding her 46 month old infant, presented to hospital on 11/16 for a 4 day history of painful redness and warmth to right knee . She denies any trauma or recent insect bite to her leg. As an outpatient, she saw Dr. Ranell Patrick who started her on Augmentin x4 days ago.  No fevers but she has been taking ibuprofen for pain.  Initially, the swelling and pain gradually improved over the course of days, but it has worsened on day of admit. She noted that redness is moving down to her legs beyond knee. Bending and walking makes her pain worse and lying makes it better. She was started on vancomycin and piptazo, plus 1 dose of clindamycin. No leukocytosis nor any fevers during admission. Erythema adn swelling has improved while on IV antibiotics.She was also evaluated by orthopedics who thought this could be prepatellar bursitis, cellulitis, or questionable septic arthritis   Past Medical History  Diagnosis Date  . Hx of varicella   . Asthma     pregnancy only    Allergies: No Known Allergies  MEDICATIONS: . multivitamin with minerals  1 tablet Oral Daily  . piperacillin-tazobactam (ZOSYN)  IV  3.375 g Intravenous Q8H  . vancomycin  1,000 mg Intravenous Q12H    History  Substance Use Topics  . Smoking status: Never Smoker   . Smokeless tobacco: Never Used  . Alcohol Use: No    Family History  Problem Relation Age of Onset  . Heart disease Father   . Heart attack Father   . Cystic fibrosis Brother     Review of Systems  Constitutional: Negative for fever, chills,  diaphoresis, activity change, appetite change, fatigue and unexpected weight change.  HENT: Negative for congestion, sore throat, rhinorrhea, sneezing, trouble swallowing and sinus pressure.  Eyes: Negative for photophobia and visual disturbance.  Respiratory: Negative for cough, chest tightness, shortness of breath, wheezing and stridor.  Cardiovascular: Negative for chest pain, palpitations and leg swelling.  Gastrointestinal: Negative for nausea, vomiting, abdominal pain, diarrhea, constipation, blood in stool, abdominal distention and anal bleeding.  Genitourinary: Negative for dysuria, hematuria, flank pain and difficulty urinating.  Musculoskeletal: Negative for myalgias, back pain, joint swelling, arthralgias and gait problem.  Skin: Negative for color change, pallor, rash and wound.  Neurological: Negative for dizziness, tremors, weakness and light-headedness.  Hematological: Negative for adenopathy. Does not bruise/bleed easily.  Psychiatric/Behavioral: Negative for behavioral problems, confusion, sleep disturbance, dysphoric mood, decreased concentration and agitation.     OBJECTIVE: Temp:  [97.9 F (36.6 C)-98.9 F (37.2 C)] 98.7 F (37.1 C) (11/17 1333) Pulse Rate:  [85-94] 93 (11/17 1333) Resp:  [16-18] 16 (11/17 1333) BP: (91-115)/(56-78) 115/78 mmHg (11/17 1333) SpO2:  [97 %-100 %] 98 % (11/17 1333) Weight:  [130 lb (58.968 kg)] 130 lb (58.968 kg) (11/16 1953)  Physical Exam  Constitutional: oriented to person, place, and time. appears well-developed and well-nourished. No distress.  HENT:  Mouth/Throat:  Oropharynx is clear and moist. No oropharyngeal exudate.  Cardiovascular: Normal rate, regular rhythm and normal heart sounds. Exam reveals no gallop and no friction rub.  No murmur heard.  Pulmonary/Chest: Effort normal and breath sounds normal. No respiratory distress.  no wheezes.  Abdominal: Soft. Bowel sounds are normal.  exhibits no distension. There is no  tenderness.  Lymphadenopathy:  no cervical adenopathy.  Musculoskeletal: Normal range of motion. She exhibits tenderness (prepatellar, med/lat joint line and suprapatellar pouch).  ome knee swelling but without large effusion.anterior knee redness. Redness down tibia is improved based on comparison photos from iphone pictures taken by patient . Increased warmth adn swelling of right knee.. Calf nontender and NVI.  Neurological:  alert and oriented to person, place, and time.  Skin: Skin is warm and dry. No rash noted. No erythema.  Psychiatric:  normal mood and affect.  behavior is normal.    LABS: Results for orders placed during the hospital encounter of 01/31/13 (from the past 48 hour(s))  CBC WITH DIFFERENTIAL     Status: Abnormal   Collection Time    01/31/13  4:41 PM      Result Value Range   WBC 8.7  4.0 - 10.5 K/uL   RBC 4.00  3.87 - 5.11 MIL/uL   Hemoglobin 11.6 (*) 12.0 - 15.0 g/dL   HCT 40.9 (*) 81.1 - 91.4 %   MCV 85.3  78.0 - 100.0 fL   MCH 29.0  26.0 - 34.0 pg   MCHC 34.0  30.0 - 36.0 g/dL   RDW 78.2  95.6 - 21.3 %   Platelets 239  150 - 400 K/uL   Neutrophils Relative % 67  43 - 77 %   Neutro Abs 5.8  1.7 - 7.7 K/uL   Lymphocytes Relative 18  12 - 46 %   Lymphs Abs 1.6  0.7 - 4.0 K/uL   Monocytes Relative 12  3 - 12 %   Monocytes Absolute 1.0  0.1 - 1.0 K/uL   Eosinophils Relative 3  0 - 5 %   Eosinophils Absolute 0.3  0.0 - 0.7 K/uL   Basophils Relative 0  0 - 1 %   Basophils Absolute 0.0  0.0 - 0.1 K/uL  BASIC METABOLIC PANEL     Status: None   Collection Time    01/31/13  4:41 PM      Result Value Range   Sodium 139  135 - 145 mEq/L   Potassium 3.6  3.5 - 5.1 mEq/L   Chloride 104  96 - 112 mEq/L   CO2 24  19 - 32 mEq/L   Glucose, Bld 89  70 - 99 mg/dL   BUN 10  6 - 23 mg/dL   Creatinine, Ser 0.86  0.50 - 1.10 mg/dL   Calcium 9.2  8.4 - 57.8 mg/dL   GFR calc non Af Amer >90  >90 mL/min   GFR calc Af Amer >90  >90 mL/min   Comment: (NOTE)     The eGFR  has been calculated using the CKD EPI equation.     This calculation has not been validated in all clinical situations.     eGFR's persistently <90 mL/min signify possible Chronic Kidney     Disease.  BASIC METABOLIC PANEL     Status: None   Collection Time    02/01/13  4:15 AM      Result Value Range   Sodium 142  135 - 145 mEq/L   Potassium 3.9  3.5 - 5.1 mEq/L   Chloride 106  96 - 112 mEq/L   CO2 26  19 - 32 mEq/L   Glucose, Bld 98  70 - 99 mg/dL   BUN 10  6 - 23 mg/dL   Creatinine, Ser 1.61  0.50 - 1.10 mg/dL   Calcium 9.2  8.4 - 09.6 mg/dL   GFR calc non Af Amer >90  >90 mL/min   GFR calc Af Amer >90  >90 mL/min   Comment: (NOTE)     The eGFR has been calculated using the CKD EPI equation.     This calculation has not been validated in all clinical situations.     eGFR's persistently <90 mL/min signify possible Chronic Kidney     Disease.  CBC     Status: Abnormal   Collection Time    02/01/13  4:15 AM      Result Value Range   WBC 7.4  4.0 - 10.5 K/uL   RBC 4.15  3.87 - 5.11 MIL/uL   Hemoglobin 11.9 (*) 12.0 - 15.0 g/dL   HCT 04.5 (*) 40.9 - 81.1 %   MCV 86.0  78.0 - 100.0 fL   MCH 28.7  26.0 - 34.0 pg   MCHC 33.3  30.0 - 36.0 g/dL   RDW 91.4  78.2 - 95.6 %   Platelets 231  150 - 400 K/uL    MICRO: none IMAGING: Dg Knee Complete 4 Views Right  01/31/2013   CLINICAL DATA:  Right knee pain for 4 days, no trauma  EXAM: RIGHT KNEE - COMPLETE 4+ VIEW  COMPARISON:  None.  FINDINGS: There is no evidence of fracture, dislocation, or joint effusion. There is no evidence of arthropathy or other focal bone abnormality. Mild prepatellar soft tissue swelling is evident.  IMPRESSION: Mild prepatellar soft tissue swelling is noted. This could indicate bursitis or cellulitis.   Electronically Signed   By: Christiana Pellant M.D.   On: 01/31/2013 17:15    Assessment/Plan:  31yo F with swelling and erythema of right knee concerning for cellulitis +/- prepatellar bursitis or septic  arthritis  - concur with ortho that she would benefit from aspiration/ arthrocentesis to rule out prepatellar abscess/ septic arthritis - please send fluid for cell count and diff plus culture - can keep on current regimen of vanco and piptazo for now  - will need flu vaccination on discharge   B. Drue Second MD MPH [facility] [phone]

## 2013-02-01 NOTE — Consult Note (Signed)
Reason for Consult: right knee pain, possible cellulitis Referring Physician: Dr Standley Dakins  Rachel Singleton is an 32 y.o. female.  HPI: presented with a 4 day hx of right knee pain and swelling.  At onset she had prepatellar and joint swelling and was seen by Dr Beverely Low and he started augmentin.  States that pain, swelling and redness worsened and she came to [facility] yesterday.  Redness extended to the mid tibia with increased warmth.  No fever or chills.  Denies injury.  Pain with motion and weightbearing.    Past Medical History  Diagnosis Date  . Hx of varicella   . Asthma     pregnancy only    Past Surgical History  Procedure Laterality Date  . No past surgeries      Family History  Problem Relation Age of Onset  . Heart disease Father   . Heart attack Father   . Cystic fibrosis Brother     Social History:  reports that she has never smoked. She has never used smokeless tobacco. She reports that she does not drink alcohol or use illicit drugs.  Allergies: No Known Allergies  Medications: I have reviewed the patient's current medications.  Results for orders placed during the hospital encounter of 01/31/13 (from the past 48 hour(s))  CBC WITH DIFFERENTIAL     Status: Abnormal   Collection Time    01/31/13  4:41 PM      Result Value Range   WBC 8.7  4.0 - 10.5 K/uL   RBC 4.00  3.87 - 5.11 MIL/uL   Hemoglobin 11.6 (*) 12.0 - 15.0 g/dL   HCT 16.1 (*) 09.6 - 04.5 %   MCV 85.3  78.0 - 100.0 fL   MCH 29.0  26.0 - 34.0 pg   MCHC 34.0  30.0 - 36.0 g/dL   RDW 40.9  81.1 - 91.4 %   Platelets 239  150 - 400 K/uL   Neutrophils Relative % 67  43 - 77 %   Neutro Abs 5.8  1.7 - 7.7 K/uL   Lymphocytes Relative 18  12 - 46 %   Lymphs Abs 1.6  0.7 - 4.0 K/uL   Monocytes Relative 12  3 - 12 %   Monocytes Absolute 1.0  0.1 - 1.0 K/uL   Eosinophils Relative 3  0 - 5 %   Eosinophils Absolute 0.3  0.0 - 0.7 K/uL   Basophils Relative 0  0 - 1 %   Basophils Absolute 0.0  0.0  - 0.1 K/uL  BASIC METABOLIC PANEL     Status: None   Collection Time    01/31/13  4:41 PM      Result Value Range   Sodium 139  135 - 145 mEq/L   Potassium 3.6  3.5 - 5.1 mEq/L   Chloride 104  96 - 112 mEq/L   CO2 24  19 - 32 mEq/L   Glucose, Bld 89  70 - 99 mg/dL   BUN 10  6 - 23 mg/dL   Creatinine, Ser 7.82  0.50 - 1.10 mg/dL   Calcium 9.2  8.4 - 95.6 mg/dL   GFR calc non Af Amer >90  >90 mL/min   GFR calc Af Amer >90  >90 mL/min   Comment: (NOTE)     The eGFR has been calculated using the CKD EPI equation.     This calculation has not been validated in all clinical situations.     eGFR's persistently <90 mL/min  signify possible Chronic Kidney     Disease.  BASIC METABOLIC PANEL     Status: None   Collection Time    02/01/13  4:15 AM      Result Value Range   Sodium 142  135 - 145 mEq/L   Potassium 3.9  3.5 - 5.1 mEq/L   Chloride 106  96 - 112 mEq/L   CO2 26  19 - 32 mEq/L   Glucose, Bld 98  70 - 99 mg/dL   BUN 10  6 - 23 mg/dL   Creatinine, Ser 1.61  0.50 - 1.10 mg/dL   Calcium 9.2  8.4 - 09.6 mg/dL   GFR calc non Af Amer >90  >90 mL/min   GFR calc Af Amer >90  >90 mL/min   Comment: (NOTE)     The eGFR has been calculated using the CKD EPI equation.     This calculation has not been validated in all clinical situations.     eGFR's persistently <90 mL/min signify possible Chronic Kidney     Disease.  CBC     Status: Abnormal   Collection Time    02/01/13  4:15 AM      Result Value Range   WBC 7.4  4.0 - 10.5 K/uL   RBC 4.15  3.87 - 5.11 MIL/uL   Hemoglobin 11.9 (*) 12.0 - 15.0 g/dL   HCT 04.5 (*) 40.9 - 81.1 %   MCV 86.0  78.0 - 100.0 fL   MCH 28.7  26.0 - 34.0 pg   MCHC 33.3  30.0 - 36.0 g/dL   RDW 91.4  78.2 - 95.6 %   Platelets 231  150 - 400 K/uL    Dg Knee Complete 4 Views Right  01/31/2013   CLINICAL DATA:  Right knee pain for 4 days, no trauma  EXAM: RIGHT KNEE - COMPLETE 4+ VIEW  COMPARISON:  None.  FINDINGS: There is no evidence of fracture,  dislocation, or joint effusion. There is no evidence of arthropathy or other focal bone abnormality. Mild prepatellar soft tissue swelling is evident.  IMPRESSION: Mild prepatellar soft tissue swelling is noted. This could indicate bursitis or cellulitis.   Electronically Signed   By: Christiana Pellant M.D.   On: 01/31/2013 17:15    Review of Systems  Constitutional: Negative.   HENT: Negative.   Respiratory: Negative.   Neurological: Negative.   Psychiatric/Behavioral: Negative.    Blood pressure 91/56, pulse 92, temperature 98.9 F (37.2 C), temperature source Oral, resp. rate 16, height 5\' 7"  (1.702 m), weight 58.968 kg (130 lb), SpO2 100.00%. Physical Exam  Constitutional: She is oriented to person, place, and time. She appears well-developed and well-nourished. No distress.  HENT:  Head: Normocephalic and atraumatic.  Eyes: EOM are normal. Pupils are equal, round, and reactive to light.  Respiratory: No respiratory distress.  Musculoskeletal: Normal range of motion. She exhibits tenderness (prepatellar, med/lat joint line and suprapatellar pouch).  Cruciate and collateral ligaments stable.  Some knee swelling but without large effusion.  Continues to have some anterior knee redness.  Redness down tibia is improved from pictures in chart.  Increased warmth knee and prox tib area.  Calf nontender and NVI.    Neurological: She is alert and oriented to person, place, and time.  Psychiatric: She has a normal mood and affect.    Assessment: Right knee pain and swelling.  ? Septic arthritis, prepatellar bursitis, cellulitis.    Plan: At this time would recommend ID consult.  She has had some improvement since starting IV abx.  Question whether or not she needs a PICC line before d/c home.  May need attempt at joint aspiration and analysis of fluid.  This may need to be done by ultrasound guidance with radiologist. Dr Shon Baton will see patient this evening.  I spoke with Dr Laural Benes and discussed  my evaluation and recommendations as discussed with Dr Shon Baton.    , M 02/01/2013, 12:45 PM

## 2013-02-01 NOTE — Progress Notes (Signed)
[facility] PROGRESS NOTE  Rachel Singleton ZOX:[id number] DOB: April 22, 1980 DOA: 01/31/2013 PCP: Almon Hercules., MD  Assessment/Plan: 1. Prepatellar abscess and cellulitis of right knee - cellulitis is improving, but still concentrated fluctuance, redness and heat just over the patella. This likely is an abscess and may need I&D. Will ask orthopedics to evaluate. Continue Zosyn and vancomycin for now pending orthopedics evaluation.   Code Status: Full  Family Communication: none at bedside   HPI/Subjective: Pt reports significant improvement with IV antibiotics.   Objective: Filed Vitals:   02/01/13 0500  BP: 91/56  Pulse: 92  Temp: 98.9 F (37.2 C)  Resp: 16    Intake/Output Summary (Last 24 hours) at 02/01/13 1106 Last data filed at 02/01/13 1008  Gross per 24 hour  Intake    240 ml  Output    800 ml  Net   -560 ml   Filed Weights   01/31/13 1953  Weight: 130 lb (58.968 kg)    Exam:  Constitutional: She is oriented to person, place, and time. She appears well-developed and well-nourished. No distress.  HENT: Head: Normocephalic.  Eyes: Conjunctivae and EOM are normal.  Cardiovascular: Normal rate.  Pulmonary/Chest: Effort normal and breath sounds normal. No stridor.  Abdominal: Soft. Bowel sounds are normal.  Musculoskeletal: Normal range of motion. Right knee prepatellar abscess and surrounding erythema, hot to touch, tender to palpation Neurological: She is alert and oriented to person, place, and time.  Skin: Induration to anterior right knee with streaking peripherally and proximally  Psychiatric: She has a normal mood and affect.   Data Reviewed: Basic Metabolic Panel:  Recent Labs Lab 01/31/13 1641 02/01/13 0415  NA 139 142  K 3.6 3.9  CL 104 106  CO2 24 26  GLUCOSE 89 98  BUN 10 10  CREATININE 0.59 0.68  CALCIUM 9.2 9.2   Liver Function Tests: No results found for this basename: AST, ALT, ALKPHOS, BILITOT, PROT, ALBUMIN,  in the last 168  hours No results found for this basename: LIPASE, AMYLASE,  in the last 168 hours No results found for this basename: AMMONIA,  in the last 168 hours CBC:  Recent Labs Lab 01/31/13 1641 02/01/13 0415  WBC 8.7 7.4  NEUTROABS 5.8  --   HGB 11.6* 11.9*  HCT 34.1* 35.7*  MCV 85.3 86.0  PLT 239 231   Cardiac Enzymes: No results found for this basename: CKTOTAL, CKMB, CKMBINDEX, TROPONINI,  in the last 168 hours BNP (last 3 results) No results found for this basename: PROBNP,  in the last 8760 hours CBG: No results found for this basename: GLUCAP,  in the last 168 hours  No results found for this or any previous visit (from the past 240 hour(s)).   Studies: Dg Knee Complete 4 Views Right  01/31/2013   CLINICAL DATA:  Right knee pain for 4 days, no trauma  EXAM: RIGHT KNEE - COMPLETE 4+ VIEW  COMPARISON:  None.  FINDINGS: There is no evidence of fracture, dislocation, or joint effusion. There is no evidence of arthropathy or other focal bone abnormality. Mild prepatellar soft tissue swelling is evident.  IMPRESSION: Mild prepatellar soft tissue swelling is noted. This could indicate bursitis or cellulitis.   Electronically Signed   By: Christiana Pellant M.D.   On: 01/31/2013 17:15    Scheduled Meds: . multivitamin with minerals  1 tablet Oral Daily  . piperacillin-tazobactam (ZOSYN)  IV  3.375 g Intravenous Q8H  . vancomycin  1,000 mg Intravenous Q12H  Active Problems:   Cellulitis   Breast feeding status of mother   Laural Benes  [facility] Pager [phone]. If 7PM-7AM, please contact night-coverage at [email or web address], password [facility] 02/01/2013, 11:06 AM  LOS: 1 day

## 2013-02-02 DIAGNOSIS — L0291 Cutaneous abscess, unspecified: Secondary | ICD-10-CM

## 2013-02-02 LAB — VANCOMYCIN, TROUGH: Vancomycin Tr: 9.3 ug/mL — ABNORMAL LOW (ref 10.0–20.0)

## 2013-02-02 MED ORDER — VANCOMYCIN HCL 500 MG IV SOLR
500.0000 mg | Freq: Once | INTRAVENOUS | Status: AC
  Administered 2013-02-02: 500 mg via INTRAVENOUS
  Filled 2013-02-02: qty 500

## 2013-02-02 MED ORDER — SULFAMETHOXAZOLE-TRIMETHOPRIM 800-160 MG PO TABS: 1.0000 | ORAL_TABLET | Freq: Two times a day (BID) | ORAL | Status: DC

## 2013-02-02 MED ORDER — SULFAMETHOXAZOLE-TRIMETHOPRIM 800-160 MG PO TABS: 2.0000 | ORAL_TABLET | Freq: Two times a day (BID) | ORAL | Status: DC

## 2013-02-02 MED ORDER — VANCOMYCIN HCL 10 G IV SOLR
1500.0000 mg | Freq: Two times a day (BID) | INTRAVENOUS | Status: DC
  Filled 2013-02-02: qty 1500

## 2013-02-02 NOTE — Progress Notes (Signed)
Subjective: Seen this morning.  Feeling better after prepatellar bursa aspiration by dr Ranell Patrick.  Wanting to go home.     Objective: Vital signs in last 24 hours: Temp:  [97.5 F (36.4 C)-98.3 F (36.8 C)] 97.5 F (36.4 C) (11/18 0550) Pulse Rate:  [87-89] 87 (11/18 0550) Resp:  [16] 16 (11/18 0550) BP: (102-108)/(65-72) 102/65 mmHg (11/18 0550) SpO2:  [99 %] 99 % (11/18 0550)  Intake/Output from previous day: 11/17 0701 - 11/18 0700 In: 1470 [P.O.:720; IV Piggyback:750] Out: 1800 [Urine:1800] Intake/Output this shift:     Recent Labs  01/31/13 1641 02/01/13 0415  HGB 11.6* 11.9*    Recent Labs  01/31/13 1641 02/01/13 0415  WBC 8.7 7.4  RBC 4.00 4.15  HCT 34.1* 35.7*  PLT 239 231    Recent Labs  01/31/13 1641 02/01/13 0415  NA 139 142  K 3.6 3.9  CL 104 106  CO2 24 26  BUN 10 10  CREATININE 0.59 0.68  GLUCOSE 89 98  CALCIUM 9.2 9.2   No results found for this basename: LABPT, INR,  in the last 72 hours  Exam: continues to have some prepatellar redness and swelling but improved.  Knee still has some diffuse tenderness, but no significant palpable effusion.  Calf nontender, nvi.  Dressing reapplied.    Assessment/Plan: Cultures pending.  Will await recommendation from ID.  IV vs po abx.     OWENS,JAMES M 02/02/2013, 1:43 PM

## 2013-02-02 NOTE — Discharge Summary (Addendum)
Physician Discharge Summary  Rachel Singleton ZOX:[id number] DOB: 01-29-1981 DOA: 01/31/2013  PCP: Almon Hercules., MD  Admit date: 01/31/2013 Discharge date: 02/02/2013   Recommendations for Outpatient Follow-up:  1. Follow up on wound cultures 2. Complete full 3 weeks of antibiotics  Discharge Diagnoses:  Active Problems:   Cellulitis   Breast feeding status of mother   Prepatellar abscess  Discharge Condition: Stable   Diet recommendation: Regular   Filed Weights   01/31/13 1953  Weight: 130 lb (58.968 kg)    History of present illness:  Who is complaining of knee painful redness and warmth to right knee onset 4 days ago. Woke up and it was hurting and red. Saw Dr. Ranell Patrick who started her on Augmentin x4 days ago. Denies inset bite. Had some rauma to her heel few days before. No fevers but she has been taking ibuprofen for pain. She has had body aches. Initially, the swelling and pain gradually improved over the course of days, but it has worsened today. She states that today redness is moving down to her legs and experiencing some numbness. Bending and walking makes her pain worse and lying makes it better. She is breastfeeding currently. Not an avid runner or exerciser.  In the ER, no WBC but failed outpatient augmentin therapy  Hospital Course:  1. Prepatellar abscess and cellulitis of right knee - cellulitis is improving after IV zosyn and Vanc, s/p aspiration of infected bursa by orthopedics, cultures to be followed up outpatient by ID and ortho.  ID recommended patient complete 3 weeks of oral bactrim DS and patient can breastfeed.   2.  Pain resolved.   Code Status: Full  Family Communication: father at bedside   Procedures: Right knee abscess drainage  Consultations:  Orthopedics  Infectious Diseases  Discharge Exam: Discharge Instructions  Discharge Orders   Future Orders Complete By Expires   Discharge instructions  As directed    Comments:     You can  breastfeed while taking bactrim Return if symptoms recur, worsen or new problems develop.   Discontinue IV  As directed    Increase activity slowly  As directed        Medication List    STOP taking these medications       amoxicillin-clavulanate 500-125 MG per tablet  Commonly known as:  AUGMENTIN     ibuprofen 600 MG tablet  Commonly known as:  ADVIL,MOTRIN      TAKE these medications       multivitamins ther. w/minerals Tabs tablet  Take 1 tablet by mouth daily.     sulfamethoxazole-trimethoprim 800-160 MG per tablet  Commonly known as:  BACTRIM DS,SEPTRA DS  Take 2 tablet by mouth 2 (two) times daily x 3 weeks       No Known Allergies     Follow-up Information   Schedule an appointment as soon as possible for a visit with NORRIS,STEVEN R, MD. (needs follow up visit in one week.  return sooner if needed)    Specialty:  Orthopedic Surgery   Contact information:   [address] [place] [address] [phone]       Follow up with Judyann Munson, MD. Schedule an appointment as soon as possible for a visit in 2 weeks. (follow up abscess)    Specialty:  Infectious Diseases   Contact information:   [address] [place] [address] [phone]        The results of significant diagnostics from  this hospitalization (including imaging, microbiology, ancillary and laboratory) are listed below for reference.    Significant Diagnostic Studies: Dg Knee Complete 4 Views Right  01/31/2013   CLINICAL DATA:  Right knee pain for 4 days, no trauma  EXAM: RIGHT KNEE - COMPLETE 4+ VIEW  COMPARISON:  None.  FINDINGS: There is no evidence of fracture, dislocation, or joint effusion. There is no evidence of arthropathy or other focal bone abnormality. Mild prepatellar soft tissue swelling is evident.  IMPRESSION: Mild prepatellar soft tissue swelling is noted. This could indicate bursitis or cellulitis.   Electronically Signed   By: Christiana Pellant M.D.   On: 01/31/2013 17:15    Microbiology: Recent Results (from the past 240 hour(s))  BODY FLUID CULTURE     Status: None   Collection Time    02/01/13  6:45 PM      Result Value Range Status   Specimen Description OTHER SYNOVIAL BURSA   Final   Special Requests Normal   Final   Gram Stain     Final   Value: FEW WBC PRESENT,BOTH PMN AND MONONUCLEAR     NO ORGANISMS SEEN     Performed at [facility]   Culture     Final   Value: NO GROWTH     Performed at [facility]   Report Status PENDING   Incomplete     Labs: Basic Metabolic Panel:  Recent Labs Lab 01/31/13 1641 02/01/13 0415  NA 139 142  K 3.6 3.9  CL 104 106  CO2 24 26  GLUCOSE 89 98  BUN 10 10  CREATININE 0.59 0.68  CALCIUM 9.2 9.2   Liver Function Tests: No results found for this basename: AST, ALT, ALKPHOS, BILITOT, PROT, ALBUMIN,  in the last 168 hours No results found for this basename: LIPASE, AMYLASE,  in the last 168 hours No results found for this basename: AMMONIA,  in the last 168 hours CBC:  Recent Labs Lab 01/31/13 1641 02/01/13 0415  WBC 8.7 7.4  NEUTROABS 5.8  --   HGB 11.6* 11.9*  HCT 34.1* 35.7*  MCV 85.3 86.0  PLT 239 231   Cardiac Enzymes: No results found for this basename: CKTOTAL, CKMB, CKMBINDEX, TROPONINI,  in the last 168 hours BNP: BNP (last 3 results) No results found for this basename: PROBNP,  in the last 8760 hours CBG: No results found for this basename: GLUCAP,  in the last 168 hours  Signed:  Devonne Lalani  [facility] 02/02/2013, 3:41 PM

## 2013-02-02 NOTE — Progress Notes (Signed)
Pt to d/c home with PO antibiotics. AVS reviewed and "My Chart" discussed with pt. Pt capable of verbalizing medications and follow-up appointments. Remains hemodynamically stable. No signs and symptoms of distress. Educated pt to contact Dr. Ranell Patrick if knee has increased swelling, redness, or pain.

## 2013-02-02 NOTE — Progress Notes (Addendum)
    [facility]    Date of Admission:  01/31/2013   Total days of antibiotics 7        Day 3 piptazo        Day 3 vanco           ID: Rachel Singleton is a 31 y.o. female with cellulitis with presumed septic prepatellar bursitis Active Problems:   Cellulitis   Breast feeding status of mother   Prepatellar abscess    Subjective: Afebrile, feeling better since aspiration, significant decrease in swelling and erythema since aspirate  Medications:  . multivitamin with minerals  1 tablet Oral Daily  . piperacillin-tazobactam (ZOSYN)  IV  3.375 g Intravenous Q8H  . vancomycin  1,500 mg Intravenous Q12H    Objective: Vital signs in last 24 hours: Temp:  [97.5 F (36.4 C)-98.3 F (36.8 C)] 97.5 F (36.4 C) (11/18 0550) Pulse Rate:  [87-89] 87 (11/18 0550) Resp:  [16] 16 (11/18 0550) BP: (102-108)/(65-72) 102/65 mmHg (11/18 0550) SpO2:  [99 %] 99 % (11/18 0550) Physical Exam  Constitutional:  oriented to person, place, and time.  appears well-developed and well-nourished. No distress.  HENT:  Mouth/Throat: Oropharynx is clear and moist. No oropharyngeal exudate.  Cardiovascular: Normal rate, regular rhythm and normal heart sounds. Exam reveals no gallop and no friction rub.  No murmur heard.  Pulmonary/Chest: Effort normal and breath sounds normal. No respiratory distress.  no wheezes.  Abdominal: Soft. Bowel sounds are normal.  exhibits no distension. There is no tenderness.  Lymphadenopathy:  no cervical adenopathy.  Neurological:  alert and oriented to person, place, and time.  Skin: Skin is warm and dry. No rash noted. No erythema.  Ext: right knee improvement in decrease in swelling, less erythema    Lab Results  Recent Labs  01/31/13 1641 02/01/13 0415  WBC 8.7 7.4  HGB 11.6* 11.9*  HCT 34.1* 35.7*  NA 139 142  K 3.6 3.9  CL 104 106  CO2 24 26  BUN 10 10  CREATININE 0.59 0.68    Microbiology: 11/17 wound cx:  pending Studies/Results: Dg Knee Complete 4 Views Right  01/31/2013   CLINICAL DATA:  Right knee pain for 4 days, no trauma  EXAM: RIGHT KNEE - COMPLETE 4+ VIEW  COMPARISON:  None.  FINDINGS: There is no evidence of fracture, dislocation, or joint effusion. There is no evidence of arthropathy or other focal bone abnormality. Mild prepatellar soft tissue swelling is evident.  IMPRESSION: Mild prepatellar soft tissue swelling is noted. This could indicate bursitis or cellulitis.   Electronically Signed   By: Christiana Pellant M.D.   On: 01/31/2013 17:15     Assessment/Plan: Would treat with 2-3 addn weeks of bactrim DS 2 tabs BID. Discontinue IV antibiotics. Will follow up in clinic in 1-2 wks and follow up on wound cultures over the next forty-eight hours.  She can breastfed while on antibiotics  Equan Cogbill, [facility] Cell: [phone] Pager: [phone]  02/02/2013, 2:22 PM

## 2013-02-02 NOTE — Progress Notes (Signed)
[facility] PROGRESS NOTE  ANDERSON COPPOCK WUJ:[id number] DOB: 1980/06/04 DOA: 01/31/2013 PCP: Almon Hercules., MD  Assessment/Plan: 1. Prepatellar abscess and cellulitis of right knee - cellulitis is improving, s/p aspiration of infected bursa by orthopedics, continuing IV antibiotics per now, further recs per ID consult. Pain resolved.   Code Status: Full  Family Communication: father at bedside   HPI/Subjective: Pt reports marked improvement   Objective: Filed Vitals:   02/02/13 0550  BP: 102/65  Pulse: 87  Temp: 97.5 F (36.4 C)  Resp: 16    Intake/Output Summary (Last 24 hours) at 02/02/13 1146 Last data filed at 02/01/13 2246  Gross per 24 hour  Intake    780 ml  Output   1000 ml  Net   -220 ml   Filed Weights   01/31/13 1953  Weight: 130 lb (58.968 kg)    Exam:  Constitutional: She is oriented to person, place, and time. She appears well-developed and well-nourished. No distress.  HENT: Head: Normocephalic.  Eyes: Conjunctivae and EOM are normal.  Cardiovascular: Normal rate.  Pulmonary/Chest: Effort normal and breath sounds normal. No stridor.  Abdominal: Soft. Bowel sounds are normal.  Musculoskeletal: Normal range of motion. Right knee prepatellar redness is nearly resolved.  Neurological: She is alert and oriented to person, place, and time.  Skin: much less erythema of right patella Psychiatric: She has a normal mood and affect.   Data Reviewed: Basic Metabolic Panel:  Recent Labs Lab 01/31/13 1641 02/01/13 0415  NA 139 142  K 3.6 3.9  CL 104 106  CO2 24 26  GLUCOSE 89 98  BUN 10 10  CREATININE 0.59 0.68  CALCIUM 9.2 9.2   Liver Function Tests: No results found for this basename: AST, ALT, ALKPHOS, BILITOT, PROT, ALBUMIN,  in the last 168 hours No results found for this basename: LIPASE, AMYLASE,  in the last 168 hours No results found for this basename: AMMONIA,  in the last 168 hours CBC:  Recent Labs Lab 01/31/13 1641  02/01/13 0415  WBC 8.7 7.4  NEUTROABS 5.8  --   HGB 11.6* 11.9*  HCT 34.1* 35.7*  MCV 85.3 86.0  PLT 239 231   Cardiac Enzymes: No results found for this basename: CKTOTAL, CKMB, CKMBINDEX, TROPONINI,  in the last 168 hours BNP (last 3 results) No results found for this basename: PROBNP,  in the last 8760 hours CBG: No results found for this basename: GLUCAP,  in the last 168 hours  Recent Results (from the past 240 hour(s))  BODY FLUID CULTURE     Status: None   Collection Time    02/01/13  6:45 PM      Result Value Range Status   Specimen Description OTHER SYNOVIAL BURSA   Final   Special Requests Normal   Final   Gram Stain     Final   Value: FEW WBC PRESENT,BOTH PMN AND MONONUCLEAR     NO ORGANISMS SEEN     Performed at [facility]     Final   Value: NO GROWTH     Performed at [facility]   Report Status PENDING   Incomplete     Studies: Dg Knee Complete 4 Views Right  01/31/2013   CLINICAL DATA:  Right knee pain for 4 days, no trauma  EXAM: RIGHT KNEE - COMPLETE 4+ VIEW  COMPARISON:  None.  FINDINGS: There is no evidence of fracture, dislocation, or joint effusion. There is no evidence of  arthropathy or other focal bone abnormality. Mild prepatellar soft tissue swelling is evident.  IMPRESSION: Mild prepatellar soft tissue swelling is noted. This could indicate bursitis or cellulitis.   Electronically Signed   By: Christiana Pellant M.D.   On: 01/31/2013 17:15    Scheduled Meds: . multivitamin with minerals  1 tablet Oral Daily  . piperacillin-tazobactam (ZOSYN)  IV  3.375 g Intravenous Q8H  . vancomycin  1,500 mg Intravenous Q12H  . vancomycin  500 mg Intravenous Once   Continuous Infusions:   Active Problems:   Cellulitis   Breast feeding status of mother   Prepatellar abscess   [facility]  [facility] Pager [phone]. If 7PM-7AM, please contact night-coverage at [email or web address], password [email or web address] 02/02/2013, 11:46 AM  LOS:  2 days

## 2013-02-02 NOTE — Progress Notes (Signed)
ANTIBIOTIC CONSULT NOTE - FOLLOW UP  Pharmacy Consult for Vancomycin, Zosyn Indication: R knee cellulitis +/- septic arthritis or prepatellar bursitis  No Known Allergies  Patient Measurements: Height: 5\' 7"  (170.2 cm) Weight: 130 lb (58.968 kg) (per patient) IBW/kg (Calculated) : 61.6  Labs:  Recent Labs  01/31/13 1641 02/01/13 0415  WBC 8.7 7.4  HGB 11.6* 11.9*  PLT 239 231  CREATININE 0.59 0.68   Estimated Creatinine Clearance: 93.2 ml/min (by C-G formula based on Cr of 0.68).  Recent Labs  02/02/13 0820  VANCOTROUGH 9.3*     Assessment: 32 yo breastfeeding female presents with redness and warmth to right knee onset 4 days ago. No improvement on Augmentin as outpatient. V/Z started 11/16. R Knee x-ray indicate bursitis or cellulitis.   11/16 Clinda x 1 11/16 >> Vanc >> 11/16 >> Zosyn >>  Tmax: Afeb WBC: wnl Renal: SCr 0.68, CG 93, N > 100   11/17 Bursa fluid >> pending  Today is Day # 3 Vancomycin and Zosyn for R knee cellulitis +/- septic arthritis or prepatellar bursitis. Ortho and ID on board, s/p aspiration of bursa 11/17 with culture sent. Plan to continue Vanc/Zosyn for now. Vancomycin trough this AM low at 9.3, will increase vancomycin trough goal as we are ruling out for septic arthritis, prepatellar bursitis. Pt instructed to pump and dump while on antibiotics.   Goal of Therapy:  Vancomycin trough level 15-20 mcg/ml  Plan:   Increase Vancomycin to 1500 mg IV q12h (0900 dose of vanc already given this AM so will add an additional 500 mg for total of 1500mg  dose this AM)  Continue Zosyn 3.375 gm IV q8h extended infusion over 4 hours  Pharmacy will f/u   Geoffry Paradise, PharmD, BCPS Pager: [phone] 9:42 AM Pharmacy #: [id number]

## 2013-02-02 NOTE — Discharge Instructions (Signed)
You can breastfeed while taking Bactrim   Bursitis Bursitis is a swelling and soreness (inflammation) of a fluid-filled sac (bursa) that overlies and protects a joint. It can be caused by injury, overuse of the joint, arthritis or infection. The joints most likely to be affected are the elbows, shoulders, hips and knees. HOME CARE INSTRUCTIONS   Apply ice to the affected area for 15-20 minutes each hour while awake for 2 days. Put the ice in a plastic bag and place a towel between the bag of ice and your skin.  Rest the injured joint as much as possible, but continue to put the joint through a full range of motion, 4 times per day. (The shoulder joint especially becomes rapidly "frozen" if not used.) When the pain lessens, begin normal slow movements and usual activities.  Only take over-the-counter or prescription medicines for pain, discomfort or fever as directed by your caregiver.  Your caregiver may recommend draining the bursa and injecting medicine into the bursa. This may help the healing process.  Follow all instructions for follow-up with your caregiver. This includes any orthopedic referrals, physical therapy and rehabilitation. Any delay in obtaining necessary care could result in a delay or failure of the bursitis to heal and chronic pain. SEEK IMMEDIATE MEDICAL CARE IF:   Your pain increases even during treatment.  You develop an oral temperature above 102 F (38.9 C) and have heat and inflammation over the involved bursa. MAKE SURE YOU:   Understand these instructions.  Will watch your condition.  Will get help right away if you are not doing well or get worse. Document Released: 03/01/2000 Document Revised: 05/27/2011 Document Reviewed: 02/03/2009 [facility] Patient Information 2014 [place], [place].  Cellulitis Cellulitis is an infection of the skin and the tissue under the skin. The infected area is usually red and tender. This happens most often in the arms and lower  legs. HOME CARE   Take your antibiotic medicine as told. Finish the medicine even if you start to feel better.  Keep the infected arm or leg raised (elevated).  Put a warm cloth on the area up to 4 times per day.  Only take medicines as told by your doctor.  Keep all doctor visits as told. GET HELP RIGHT AWAY IF:   You have a fever.  You feel very sleepy.  You throw up (vomit) or have watery poop (diarrhea).  You feel sick and have muscle aches and pains.  You see red streaks on the skin coming from the infected area.  Your red area gets bigger or turns a dark color.  Your bone or joint under the infected area is painful after the skin heals.  Your infection comes back in the same area or different area.  You have a puffy (swollen) bump in the infected area.  You have new symptoms. MAKE SURE YOU:   Understand these instructions.  Will watch your condition.  Will get help right away if you are not doing well or get worse. Document Released: 08/21/2007 Document Revised: 09/03/2011 Document Reviewed: 05/20/2011 [facility] Patient Information 2014 [place], [place].   If you begin to have increased pain, swelling, redness, or fever/chills, you should go to the emergency room for evaluation.

## 2013-02-04 NOTE — ED Provider Notes (Signed)
Medical screening examination/treatment/procedure(s) were performed by non-physician practitioner and as supervising physician I was immediately available for consultation/collaboration.    Nelia Shi, MD 02/04/13 [phone]

## 2013-02-05 LAB — BODY FLUID CULTURE
Culture: NO GROWTH
Special Requests: NORMAL

## 2013-02-09 ENCOUNTER — Encounter: Payer: Self-pay | Admitting: Internal Medicine

## 2013-02-09 ENCOUNTER — Ambulatory Visit (INDEPENDENT_AMBULATORY_CARE_PROVIDER_SITE_OTHER): Payer: Self-pay | Admitting: Internal Medicine

## 2013-02-09 VITALS — BP 101/65 | HR 76 | Temp 97.8°F | Ht 67.0 in | Wt 137.0 lb

## 2013-02-09 DIAGNOSIS — L02419 Cutaneous abscess of limb, unspecified: Secondary | ICD-10-CM

## 2013-02-09 NOTE — Progress Notes (Signed)
  Subjective:    Patient ID: Rachel Singleton, female    DOB: 1980/04/02, 32 y.o.   MRN: [id number]  HPI Rachel Singleton is a 32 y.o. female with past medical history of ashtma, currently breastfeeding her 2 month old infant, presented to hospital on 11/16 for a 4 day history of painful redness and warmth to right knee . She denies any trauma or recent insect bite to her leg. As an outpatient, she saw Dr. Ranell Patrick who started her on Augmentin x4 days ago. No fevers but she has been taking ibuprofen for pain. Initially, the swelling and pain gradually improved over the course of days, but it has worsened on day of admit. She noted that redness is moving down to her legs beyond knee. Bending and walking makes her pain worse and lying makes it better. She was started on vancomycin and piptazo, plus 1 dose of clindamycin. No leukocytosis nor any fevers during admission. Erythema adn swelling has improved while on IV antibiotics.She was also evaluated by orthopedics who thought this could be prepatellar bursitis, cellulitis, or questionable septic arthritis.  Drained but culture remained negative.  Xray with prepetellar vs cellulitis.  HAs been on Bactrim 2 DS bid and to get 3 weeks.     Review of Systems  Constitutional: Negative for fever, chills and fatigue.  Gastrointestinal: Negative for nausea and diarrhea.  Genitourinary: Negative for frequency and hematuria.  Musculoskeletal: Positive for arthralgias and joint swelling.  Neurological: Negative for dizziness and headaches.       Objective:   Physical Exam  Musculoskeletal: She exhibits no edema.  Minimal erythema at site of infeciton but not warm, no effusion, not tender  Neurological: She is alert.  Skin: Skin is warm and dry. No rash noted.  Psychiatric: She has a normal mood and affect. Her behavior is normal.          Assessment & Plan:

## 2013-02-09 NOTE — Assessment & Plan Note (Signed)
resloved infection. Can continue with antibiotics through Monday Dec 1 and stop.  Return PRN.  Knows to call if infeciton returns.

## 2013-02-12 ENCOUNTER — Other Ambulatory Visit: Payer: Self-pay | Admitting: Internal Medicine

## 2013-02-12 DIAGNOSIS — L0390 Cellulitis, unspecified: Secondary | ICD-10-CM

## 2013-02-12 MED ORDER — SULFAMETHOXAZOLE-TRIMETHOPRIM 800-160 MG PO TABS: 2.0000 | ORAL_TABLET | Freq: Two times a day (BID) | ORAL | Status: DC

## 2013-02-12 NOTE — Progress Notes (Signed)
Patient called that she is finishing her antibiotics today but her knee is not completely back to normal. Roughly at 90%. We will treat for addn week of bactrim ds 2 tab BID to complete a total of 4 wks of treatment for septic prepatellar bursitis and complicated cellulitis

## 2013-11-01 LAB — OB RESULTS CONSOLE GC/CHLAMYDIA
CHLAMYDIA, DNA PROBE: NEGATIVE
Gonorrhea: NEGATIVE

## 2013-11-02 ENCOUNTER — Other Ambulatory Visit: Payer: Self-pay | Admitting: Obstetrics and Gynecology

## 2013-11-15 LAB — OB RESULTS CONSOLE HEPATITIS B SURFACE ANTIGEN: HEP B S AG: NEGATIVE

## 2013-11-15 LAB — OB RESULTS CONSOLE HIV ANTIBODY (ROUTINE TESTING): HIV: NONREACTIVE

## 2013-11-15 LAB — OB RESULTS CONSOLE RUBELLA ANTIBODY, IGM: RUBELLA: IMMUNE

## 2013-11-15 LAB — OB RESULTS CONSOLE ABO/RH: RH Type: POSITIVE

## 2013-11-15 LAB — OB RESULTS CONSOLE RPR: RPR: NONREACTIVE

## 2013-11-15 LAB — OB RESULTS CONSOLE ANTIBODY SCREEN: Antibody Screen: NEGATIVE

## 2014-01-12 IMAGING — CR DG KNEE COMPLETE 4+V*R*
4 series · 4 of 4 positions shown · non-contrast
Comparison: None.

CLINICAL DATA: Right knee pain for 4 days, no trauma

EXAM:
RIGHT KNEE - COMPLETE 4+ VIEW

[x knee ap right]
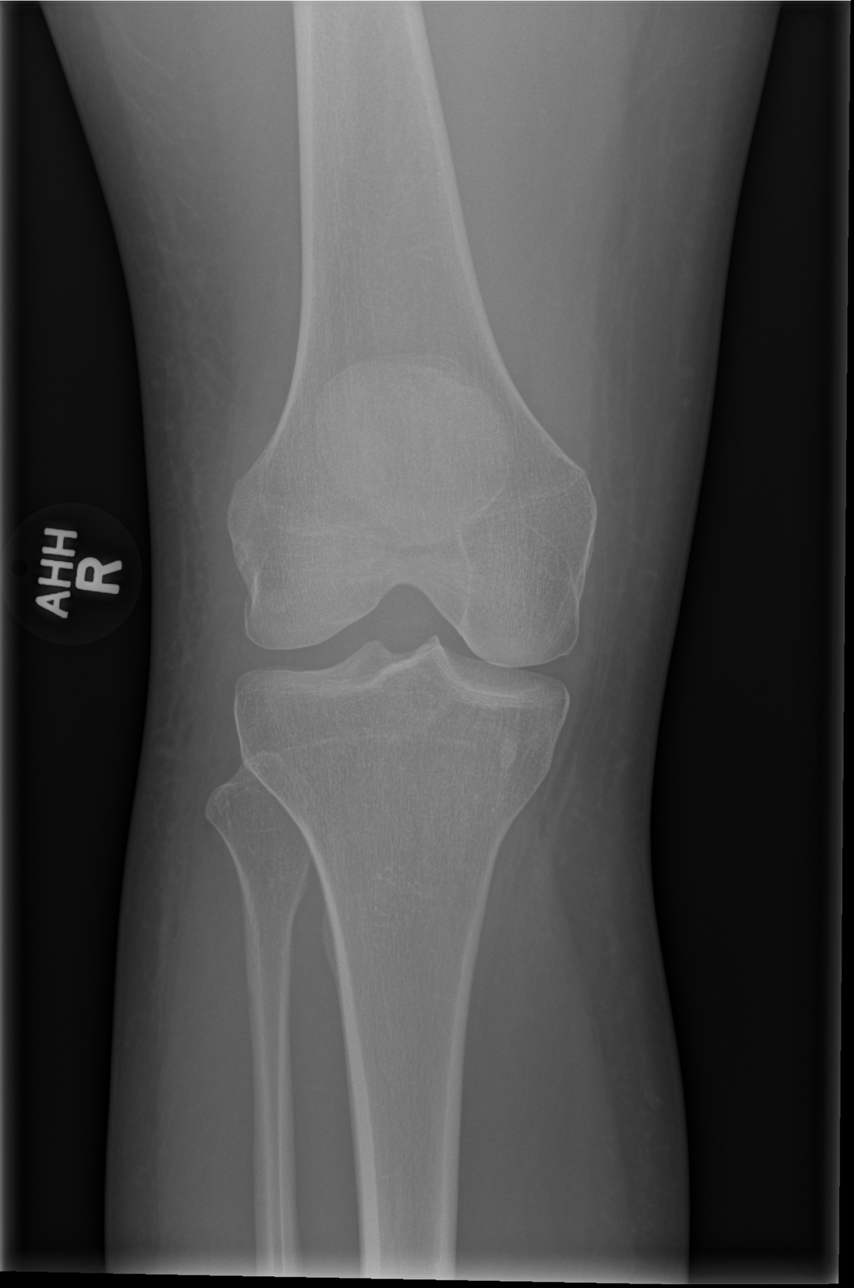

[x knee obl right (1 of 2)]
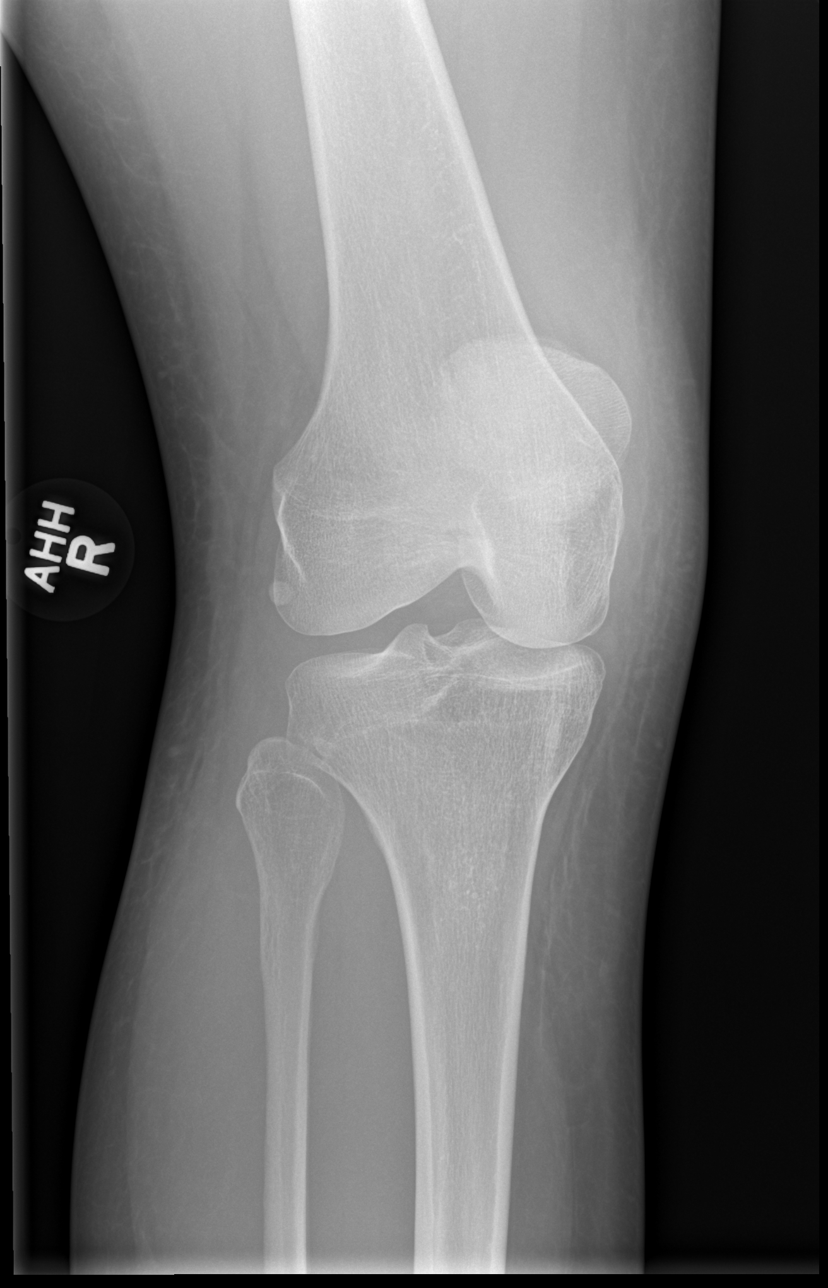

[x knee obl right (2 of 2)]
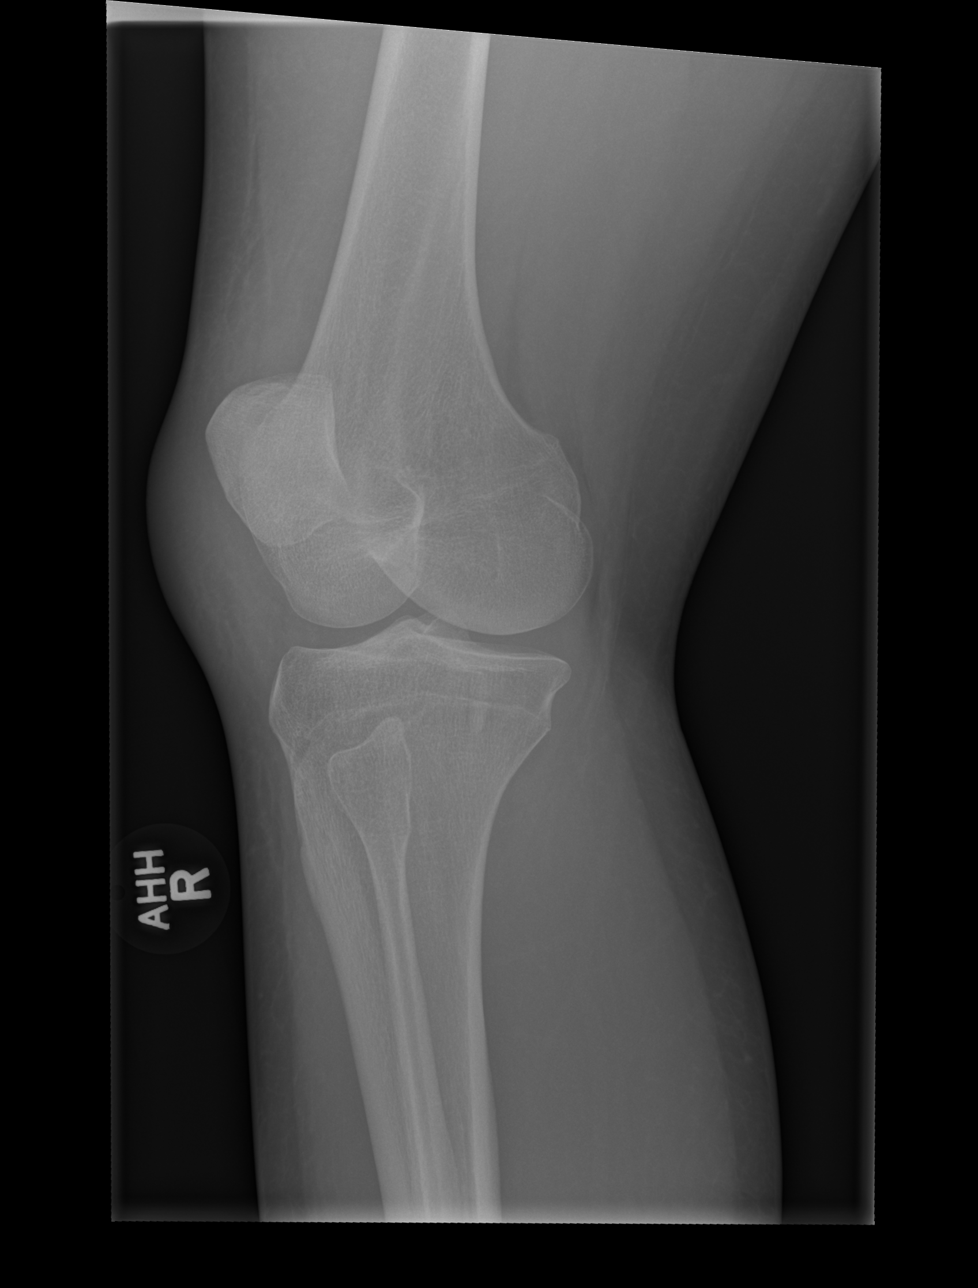

[x knee lat right]
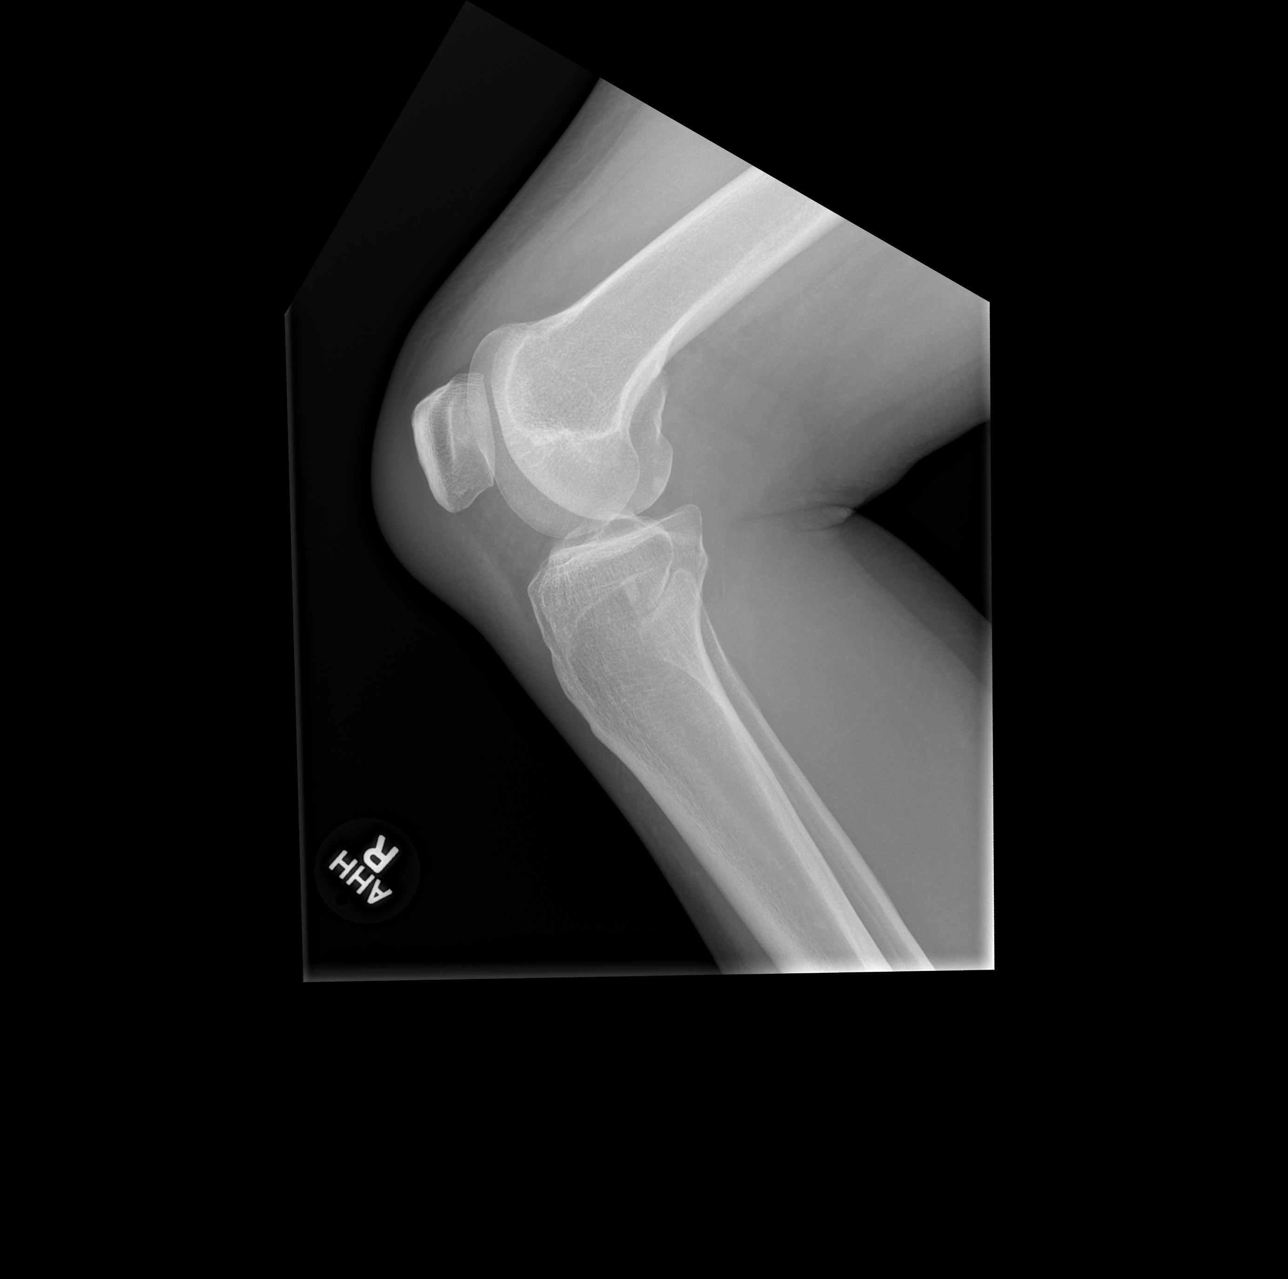

[4 of 4 positions shown; findings below may reference images not displayed]

FINDINGS: There is no evidence of fracture, dislocation, or joint effusion.
There is no evidence of arthropathy or other focal bone abnormality.
Mild prepatellar soft tissue swelling is evident.
IMPRESSION: Mild prepatellar soft tissue swelling is noted. This could indicate
bursitis or cellulitis.

## 2014-01-17 ENCOUNTER — Encounter: Payer: Self-pay | Admitting: Internal Medicine

## 2014-03-18 NOTE — L&D Delivery Note (Signed)
Delivery Note At 7:23 PM a viable and healthy female was delivered via Vaginal, Spontaneous Delivery (Presentation: right occiput; Anterior ).  APGAR: 8, 9; weight pending .   Placenta status: Intact, Spontaneous.  Cord: 3 vessels with the a loose nuchal cord reduced on the perineum  Anesthesia: Epidural  Episiotomy: None Lacerations: None Suture Repair: none Est. Blood Loss (mL): 300  Mom to postpartum.  Baby to Couplet care / Skin to Skin.  , H. 06/01/2014, 7:44 PM

## 2014-05-02 ENCOUNTER — Other Ambulatory Visit: Payer: Self-pay | Admitting: Obstetrics and Gynecology

## 2014-05-02 LAB — OB RESULTS CONSOLE GBS: GBS: NEGATIVE

## 2014-06-01 ENCOUNTER — Inpatient Hospital Stay (HOSPITAL_COMMUNITY)
Admission: AD | Admit: 2014-06-01 | Discharge: 2014-06-03 | DRG: 775 | Disposition: A | Discharge status: Home / Self Care | Payer: Self-pay | Source: Ambulatory Visit | Attending: Obstetrics and Gynecology | Admitting: Obstetrics and Gynecology

## 2014-06-01 ENCOUNTER — Encounter (HOSPITAL_COMMUNITY): Payer: Self-pay | Admitting: *Deleted

## 2014-06-01 ENCOUNTER — Inpatient Hospital Stay (HOSPITAL_COMMUNITY): Payer: Self-pay | Admitting: Anesthesiology

## 2014-06-01 DIAGNOSIS — O09523 Supervision of elderly multigravida, third trimester: Secondary | ICD-10-CM

## 2014-06-01 DIAGNOSIS — Z3493 Encounter for supervision of normal pregnancy, unspecified, third trimester: Secondary | ICD-10-CM

## 2014-06-01 DIAGNOSIS — Z3A39 39 weeks gestation of pregnancy: Secondary | ICD-10-CM | POA: Diagnosis present

## 2014-06-01 HISTORY — DX: Personal history of nicotine dependence: Z87.891

## 2014-06-01 LAB — TYPE AND SCREEN
ABO/RH(D): A POS
Antibody Screen: NEGATIVE

## 2014-06-01 LAB — CBC
HCT: 36.4 % (ref 36.0–46.0)
Hemoglobin: 12.3 g/dL (ref 12.0–15.0)
MCH: 30.6 pg (ref 26.0–34.0)
MCHC: 33.8 g/dL (ref 30.0–36.0)
MCV: 90.5 fL (ref 78.0–100.0)
Platelets: 156 10*3/uL (ref 150–400)
RBC: 4.02 MIL/uL (ref 3.87–5.11)
RDW: 13.6 % (ref 11.5–15.5)
WBC: 4.9 10*3/uL (ref 4.0–10.5)

## 2014-06-01 MED ORDER — DIBUCAINE 1 % RE OINT: 1.0000 "application " | TOPICAL_OINTMENT | RECTAL | Status: DC | PRN

## 2014-06-01 MED ORDER — PHENYLEPHRINE 40 MCG/ML (10ML) SYRINGE FOR IV PUSH (FOR BLOOD PRESSURE SUPPORT)
80.0000 ug | PREFILLED_SYRINGE | INTRAVENOUS | Status: DC | PRN
  Filled 2014-06-01: qty 2

## 2014-06-01 MED ORDER — OXYCODONE-ACETAMINOPHEN 5-325 MG PO TABS: 2.0000 | ORAL_TABLET | ORAL | Status: DC | PRN

## 2014-06-01 MED ORDER — LACTATED RINGERS IV SOLN: 500.0000 mL | Freq: Once | INTRAVENOUS | Status: DC

## 2014-06-01 MED ORDER — MISOPROSTOL 25 MCG QUARTER TABLET
25.0000 ug | ORAL_TABLET | ORAL | Status: DC | PRN
  Administered 2014-06-01: 25 ug via VAGINAL
  Filled 2014-06-01: qty 1
  Filled 2014-06-01: qty 0.25

## 2014-06-01 MED ORDER — TETANUS-DIPHTH-ACELL PERTUSSIS 5-2.5-18.5 LF-MCG/0.5 IM SUSP: 0.5000 mL | Freq: Once | INTRAMUSCULAR | Status: DC

## 2014-06-01 MED ORDER — PRENATAL MULTIVITAMIN CH
1.0000 | ORAL_TABLET | Freq: Every day | ORAL | Status: DC
  Administered 2014-06-02: 1 via ORAL
  Filled 2014-06-01: qty 1

## 2014-06-01 MED ORDER — OXYTOCIN 40 UNITS IN LACTATED RINGERS INFUSION - SIMPLE MED
1.0000 m[IU]/min | INTRAVENOUS | Status: DC
  Administered 2014-06-01: 2 m[IU]/min via INTRAVENOUS
  Filled 2014-06-01: qty 1000

## 2014-06-01 MED ORDER — TERBUTALINE SULFATE 1 MG/ML IJ SOLN
0.2500 mg | Freq: Once | INTRAMUSCULAR | Status: DC | PRN
Start: 2014-06-01 — End: 2014-06-01
  Filled 2014-06-01: qty 1

## 2014-06-01 MED ORDER — ZOLPIDEM TARTRATE 5 MG PO TABS: 5.0000 mg | ORAL_TABLET | Freq: Every evening | ORAL | Status: DC | PRN

## 2014-06-01 MED ORDER — OXYCODONE-ACETAMINOPHEN 5-325 MG PO TABS: 1.0000 | ORAL_TABLET | ORAL | Status: DC | PRN

## 2014-06-01 MED ORDER — OXYTOCIN 40 UNITS IN LACTATED RINGERS INFUSION - SIMPLE MED: 62.5000 mL/h | INTRAVENOUS | Status: DC

## 2014-06-01 MED ORDER — LIDOCAINE HCL (PF) 1 % IJ SOLN
INTRAMUSCULAR | Status: DC | PRN
  Administered 2014-06-01 (×2): 5 mL

## 2014-06-01 MED ORDER — DIPHENHYDRAMINE HCL 50 MG/ML IJ SOLN: 12.5000 mg | INTRAMUSCULAR | Status: DC | PRN

## 2014-06-01 MED ORDER — EPHEDRINE 5 MG/ML INJ
10.0000 mg | INTRAVENOUS | Status: DC | PRN
  Filled 2014-06-01: qty 2

## 2014-06-01 MED ORDER — SIMETHICONE 80 MG PO CHEW: 80.0000 mg | CHEWABLE_TABLET | ORAL | Status: DC | PRN

## 2014-06-01 MED ORDER — METHYLERGONOVINE MALEATE 0.2 MG PO TABS: 0.2000 mg | ORAL_TABLET | ORAL | Status: DC | PRN

## 2014-06-01 MED ORDER — ONDANSETRON HCL 4 MG/2ML IJ SOLN: 4.0000 mg | INTRAMUSCULAR | Status: DC | PRN

## 2014-06-01 MED ORDER — OXYCODONE-ACETAMINOPHEN 5-325 MG PO TABS
1.0000 | ORAL_TABLET | ORAL | Status: DC | PRN
  Administered 2014-06-02 (×2): 1 via ORAL
  Filled 2014-06-01 (×2): qty 1

## 2014-06-01 MED ORDER — ONDANSETRON HCL 4 MG PO TABS: 4.0000 mg | ORAL_TABLET | ORAL | Status: DC | PRN

## 2014-06-01 MED ORDER — CITRIC ACID-SODIUM CITRATE 334-500 MG/5ML PO SOLN: 30.0000 mL | ORAL | Status: DC | PRN

## 2014-06-01 MED ORDER — WITCH HAZEL-GLYCERIN EX PADS: 1.0000 "application " | MEDICATED_PAD | CUTANEOUS | Status: DC | PRN

## 2014-06-01 MED ORDER — LIDOCAINE HCL (PF) 1 % IJ SOLN
30.0000 mL | INTRAMUSCULAR | Status: DC | PRN
  Filled 2014-06-01: qty 30

## 2014-06-01 MED ORDER — PHENYLEPHRINE 40 MCG/ML (10ML) SYRINGE FOR IV PUSH (FOR BLOOD PRESSURE SUPPORT)
80.0000 ug | PREFILLED_SYRINGE | INTRAVENOUS | Status: DC | PRN
  Filled 2014-06-01: qty 20
  Filled 2014-06-01: qty 2

## 2014-06-01 MED ORDER — LACTATED RINGERS IV SOLN
500.0000 mL | INTRAVENOUS | Status: DC | PRN
Start: 2014-06-01 — End: 2014-06-01

## 2014-06-01 MED ORDER — LACTATED RINGERS IV SOLN
INTRAVENOUS | Status: DC
  Administered 2014-06-01: 08:00:00 via INTRAVENOUS

## 2014-06-01 MED ORDER — DIPHENHYDRAMINE HCL 25 MG PO CAPS: 25.0000 mg | ORAL_CAPSULE | Freq: Four times a day (QID) | ORAL | Status: DC | PRN

## 2014-06-01 MED ORDER — LANOLIN HYDROUS EX OINT: TOPICAL_OINTMENT | CUTANEOUS | Status: DC | PRN

## 2014-06-01 MED ORDER — SENNOSIDES-DOCUSATE SODIUM 8.6-50 MG PO TABS
2.0000 | ORAL_TABLET | ORAL | Status: DC
  Administered 2014-06-01 – 2014-06-02 (×2): 2 via ORAL
  Filled 2014-06-01 (×2): qty 2

## 2014-06-01 MED ORDER — OSELTAMIVIR PHOSPHATE 75 MG PO CAPS
75.0000 mg | ORAL_CAPSULE | Freq: Every day | ORAL | Status: DC
  Administered 2014-06-01 – 2014-06-03 (×3): 75 mg via ORAL
  Filled 2014-06-01 (×3): qty 1

## 2014-06-01 MED ORDER — ONDANSETRON HCL 4 MG/2ML IJ SOLN: 4.0000 mg | Freq: Four times a day (QID) | INTRAMUSCULAR | Status: DC | PRN

## 2014-06-01 MED ORDER — BENZOCAINE-MENTHOL 20-0.5 % EX AERO: 1.0000 "application " | INHALATION_SPRAY | CUTANEOUS | Status: DC | PRN

## 2014-06-01 MED ORDER — METHYLERGONOVINE MALEATE 0.2 MG/ML IJ SOLN: 0.2000 mg | INTRAMUSCULAR | Status: DC | PRN

## 2014-06-01 MED ORDER — FENTANYL 2.5 MCG/ML BUPIVACAINE 1/10 % EPIDURAL INFUSION (WH - ANES)
14.0000 mL/h | INTRAMUSCULAR | Status: DC | PRN
  Administered 2014-06-01: 14 mL/h via EPIDURAL
  Filled 2014-06-01: qty 125

## 2014-06-01 MED ORDER — IBUPROFEN 600 MG PO TABS
600.0000 mg | ORAL_TABLET | Freq: Four times a day (QID) | ORAL | Status: DC
  Administered 2014-06-01 – 2014-06-03 (×6): 600 mg via ORAL
  Filled 2014-06-01 (×6): qty 1

## 2014-06-01 MED ORDER — OXYTOCIN BOLUS FROM INFUSION: 500.0000 mL | INTRAVENOUS | Status: DC

## 2014-06-01 MED ORDER — ACETAMINOPHEN 325 MG PO TABS: 650.0000 mg | ORAL_TABLET | ORAL | Status: DC | PRN

## 2014-06-01 NOTE — Progress Notes (Signed)
This note also relates to the following rows which could not be included: Pulse Rate - Cannot attach notes to unvalidated device data SpO2 - Cannot attach notes to unvalidated device data  Dr Tenny Crawross called, informed of fhr tracing, and nursing interventions, dr Claiborne Billingscallahan at bedside. Dr Tenny Crawross pulled up tracing to view, to keep pitocin off for 30 min, If tracing reactive may restart pitocin at that time.

## 2014-06-01 NOTE — Progress Notes (Signed)
Dr Tenny Crawross called for update on pt, informed of pitocin mmu, pt not feeling much, fhr tracing, uc pattern. Orders received.

## 2014-06-01 NOTE — Anesthesia Preprocedure Evaluation (Signed)
Anesthesia Evaluation  Patient identified by MRN, date of birth, ID band Patient awake    Reviewed: Allergy & Precautions, H&P , Patient's Chart, lab work & pertinent test results  Airway Mallampati: II  TM Distance: >3 FB Neck ROM: full    Dental   Pulmonary asthma ,  breath sounds clear to auscultation        Cardiovascular Rhythm:regular Rate:Normal     Neuro/Psych    GI/Hepatic   Endo/Other    Renal/GU      Musculoskeletal   Abdominal   Peds  Hematology   Anesthesia Other Findings   Reproductive/Obstetrics (+) Pregnancy                             Anesthesia Physical Anesthesia Plan  ASA: II  Anesthesia Plan: Epidural   Post-op Pain Management:    Induction:   Airway Management Planned:   Additional Equipment:   Intra-op Plan:   Post-operative Plan:   Informed Consent: I have reviewed the patients History and Physical, chart, labs and discussed the procedure including the risks, benefits and alternatives for the proposed anesthesia with the patient or authorized representative who has indicated his/her understanding and acceptance.     Plan Discussed with:   Anesthesia Plan Comments:         Anesthesia Quick Evaluation  

## 2014-06-01 NOTE — H&P (Signed)
Rachel Singleton is a 34 y.o. female presenting for IOL History OB History    Gravida Para Term Preterm AB TAB SAB Ectopic Multiple Living   4 3 3       3      Past Medical History  Diagnosis Date  . Hx of varicella   . Asthma     pregnancy only   Past Surgical History  Procedure Laterality Date  . No past surgeries     Family History: family history includes Cystic fibrosis in her brother; Heart attack in her father; Heart disease in her father. Social History:  reports that she has never smoked. She has never used smokeless tobacco. She reports that she does not drink alcohol or use illicit drugs.   Prenatal Transfer Tool  Maternal Diabetes: No Genetic Screening: Normal Maternal Ultrasounds/Referrals: Normal Fetal Ultrasounds or other Referrals:  None Maternal Substance Abuse:  No Significant Maternal Medications:  None Significant Maternal Lab Results:  None Other Comments:  None  ROS: as above  Dilation: 10 Effacement (%): 100 Station: +2 Exam by:: dr Tenny Crawross Blood pressure 106/79, pulse 81, temperature 97.6 F (36.4 C), temperature source Oral, resp. rate 20, height 5\' 7"  (1.702 m), weight 78.926 kg (174 lb), SpO2 100 %. Exam Physical Exam  Prenatal labs: ABO, Rh: --/--/A POS (03/16 0810) Antibody: NEG (03/16 0810) Rubella: Immune (08/31 0000) RPR: Nonreactive (08/31 0000)  HBsAg: Negative (08/31 0000)  HIV: Non-reactive (08/31 0000)  GBS: Negative (02/15 0000)   Assessment/Plan: 1) Admit 2) Pit 3) Epidural on request   Rachel Singleton H. 06/01/2014, 7:46 PM

## 2014-06-01 NOTE — Anesthesia Procedure Notes (Signed)
Epidural Patient location during procedure: OB Start time: 06/01/2014 6:10 PM  Staffing Anesthesiologist: Brayton CavesJACKSON,  Performed by: anesthesiologist   Preanesthetic Checklist Completed: patient identified, site marked, surgical consent, pre-op evaluation, timeout performed, IV checked, risks and benefits discussed and monitors and equipment checked  Epidural Patient position: sitting Prep: site prepped and draped and DuraPrep Patient monitoring: continuous pulse ox and blood pressure Approach: midline Location: L3-L4 Injection technique: LOR air  Needle:  Needle type: Tuohy  Needle gauge: 17 G Needle length: 9 cm and 9 Needle insertion depth: 5 cm cm Catheter type: closed end flexible Catheter size: 19 Gauge Catheter at skin depth: 10 cm Test dose: negative  Assessment Events: blood not aspirated, injection not painful, no injection resistance, negative IV test and no paresthesia  Additional Notes Patient identified.  Risk benefits discussed including failed block, incomplete pain control, headache, nerve damage, paralysis, blood pressure changes, nausea, vomiting, reactions to medication both toxic or allergic, and postpartum back pain.  Patient expressed understanding and wished to proceed.  All questions were answered.  Sterile technique used throughout procedure and epidural site dressed with sterile barrier dressing. No paresthesia or other complications noted.The patient did not experience any signs of intravascular injection such as tinnitus or metallic taste in mouth nor signs of intrathecal spread such as rapid motor block. Please see nursing notes for vital signs.

## 2014-06-02 LAB — CBC
HCT: 34 % — ABNORMAL LOW (ref 36.0–46.0)
HEMOGLOBIN: 11.3 g/dL — AB (ref 12.0–15.0)
MCH: 30.3 pg (ref 26.0–34.0)
MCHC: 33.2 g/dL (ref 30.0–36.0)
MCV: 91.2 fL (ref 78.0–100.0)
Platelets: 158 10*3/uL (ref 150–400)
RBC: 3.73 MIL/uL — ABNORMAL LOW (ref 3.87–5.11)
RDW: 13.7 % (ref 11.5–15.5)
WBC: 6.8 10*3/uL (ref 4.0–10.5)

## 2014-06-02 LAB — RPR: RPR Ser Ql: NONREACTIVE

## 2014-06-02 NOTE — Progress Notes (Signed)
Patient is eating, ambulating, voiding.  Pain control is good.  Filed Vitals:   06/01/14 2031 06/01/14 2046 06/01/14 2114 06/01/14 2214  BP: 112/69 113/60 117/59 111/66  Pulse: 91 87 80 79  Temp:   98.1 F (36.7 C) 98 F (36.7 C)  TempSrc:   Oral Oral  Resp: 18  18 18   Height:      Weight:      SpO2:   99% 99%    Fundus firm Perineum without swelling.  Lab Results  Component Value Date   WBC 6.8 06/02/2014   HGB 11.3* 06/02/2014   HCT 34.0* 06/02/2014   MCV 91.2 06/02/2014   PLT 158 06/02/2014    --/--/A POS (03/16 0810)/RI  A/P Post partum day 1.  Routine care.  Expect d/c routine.    Brannen Koppen A

## 2014-06-02 NOTE — Anesthesia Postprocedure Evaluation (Signed)
Anesthesia Post Note  Patient: Rachel Singleton  Procedure(s) Performed: * No procedures listed *  Anesthesia type: Epidural  Patient location: Mother/Baby  Post pain: Pain level controlled  Post assessment: Post-op Vital signs reviewed  Last Vitals:  Filed Vitals:   06/01/14 2214  BP: 111/66  Pulse: 79  Temp: 36.7 C  Resp: 18    Post vital signs: Reviewed  Level of consciousness:alert  Complications: No apparent anesthesia complications

## 2014-06-02 NOTE — Lactation Note (Signed)
This note was copied from the chart of Rachel Jodi Geraldsmily Dykstra. Lactation Consultation Note Experienced BF mom has 3 other children which she BF for 15 months each. Mom has had so much BM that she has pumped and donated before. No problem w/milk supply. Her last child who is 2 yrs. Old was in NICU and pumped and bottle fed then switched to breast well. Has good everted nipples, good easy flow of colostrum noted. States baby has been wanting to BF every hour, she feels that her milk is coming in soon. Colostrum at this time is clear. Educated about newborn behavior. Making sure has wide deep latch to prevent nipple soreness and to get a good milk transfer. Mom reports + breast changes w/pregnancy. Referred to Baby and Me Book in Breastfeeding section Pg. 22-23 for position options and Proper latch demonstration.[facility]/LC brochure given w/resources, support groups and LC services. Mom encouraged to do skin-to-skin. Patient Name: Rachel Singleton QIONG'EToday's Date: 06/02/2014 Reason for consult: Initial assessment   Maternal Data Has patient been taught Hand Expression?: Yes Does the patient have breastfeeding experience prior to this delivery?: Yes  Feeding Feeding Type: Breast Fed Length of feed: 10 min  LATCH Score/Interventions       Type of Nipple: Everted at rest and after stimulation  Comfort (Breast/Nipple): Soft / non-tender           Lactation Tools Discussed/Used     Consult Status Consult Status: Follow-up Date: 06/03/14 Follow-up type: In-patient    Charyl DancerCARVER, Paraskevi Funez G 06/02/2014, 5:55 AM

## 2014-06-02 NOTE — Progress Notes (Signed)
UR chart review completed.  

## 2014-06-03 ENCOUNTER — Encounter (HOSPITAL_COMMUNITY): Payer: Self-pay | Admitting: *Deleted

## 2014-06-03 MED ORDER — OSELTAMIVIR PHOSPHATE 75 MG PO CAPS: 75.0000 mg | ORAL_CAPSULE | Freq: Every day | ORAL | Status: DC

## 2014-06-03 MED ORDER — OXYCODONE-ACETAMINOPHEN 5-325 MG PO TABS: 1.0000 | ORAL_TABLET | Freq: Four times a day (QID) | ORAL | Status: DC | PRN

## 2014-06-03 MED ORDER — IBUPROFEN 600 MG PO TABS: 600.0000 mg | ORAL_TABLET | Freq: Four times a day (QID) | ORAL | Status: DC | PRN

## 2014-06-03 NOTE — Discharge Summary (Signed)
Obstetric Discharge Summary Reason for Admission: induction of labor Prenatal Procedures: none Intrapartum Procedures: spontaneous vaginal delivery Postpartum Procedures: none Complications-Operative and Postpartum: none HEMOGLOBIN  Date Value Ref Range Status  06/02/2014 11.3* 12.0 - 15.0 g/dL Final   HCT  Date Value Ref Range Status  06/02/2014 34.0* 36.0 - 46.0 % Final    Physical Exam:  General: alert and cooperative Lochia: appropriate Uterine Fundus: firm DVT Evaluation: No evidence of DVT seen on physical exam.  Discharge Diagnoses: Term Pregnancy-delivered  Discharge Information: Date: 06/03/2014 Activity: pelvic rest Diet: routine Medications: PNV, Ibuprofen and Percocet Condition: stable Instructions: refer to practice specific booklet Discharge to: home Follow-up Information    Follow up with Almon HerculesOSS,[name] H., MD In 4 weeks.   Specialty:  Obstetrics and Gynecology   Why:  Keep scheduled PP appointment   Contact information:   [address] [place] [id number] [phone]       Newborn Data: Live born female  Birth Weight: 7 lb 7.2 oz (3380 g) APGAR: 8, 9  Home with mother.  [facility] GEFFEL Kayen Grabel 06/03/2014, 7:39 AM

## 2014-06-03 NOTE — Discharge Instructions (Signed)
°  Pelvic rest x 6 weeks (no intercourse or tampons).  No tub baths or swimming  Do not drive until you are not taking narcotic pain medication AND you can comfortably slam on the brakes.  Please continue to take a prenatal vitamin daily.  Please call MD for temperature >101 F, heavy vaginal bleeding (soaking through one pad/hour or more for a few hours), dizziness, shortness of breath, chest pain, or any other concerns.

## 2014-06-03 NOTE — Progress Notes (Signed)
Patient is eating, ambulating, voiding.  Pain control is good.  Lochia minimal  Filed Vitals:   06/01/14 2114 06/01/14 2214 06/02/14 1853 06/03/14 0546  BP: 117/59 111/66 109/72 108/76  Pulse: 80 79 75 69  Temp: 98.1 F (36.7 C) 98 F (36.7 C) 97.7 F (36.5 C) 97.6 F (36.4 C)  TempSrc: Oral Oral Oral Oral  Resp: 18 18 17 18   Height:      Weight:      SpO2: 99% 99%      Fundus firm Ext no edema  Lab Results  Component Value Date   WBC 6.8 06/02/2014   HGB 11.3* 06/02/2014   HCT 34.0* 06/02/2014   MCV 91.2 06/02/2014   PLT 158 06/02/2014    --/--/A POS (03/16 0810)/RI  A/P [id number] PPD#2 Meeting all goals, d/c home today.  Circ in office as outpatient  [facility] GEFFEL [place] [facility]

## 2014-06-22 NOTE — Addendum Note (Signed)
Addendum  created 06/22/14 1212 by Brayton CavesFreeman Rachel Loya, MD   Modules edited: Anesthesia Responsible Staff

## 2016-11-21 LAB — OB RESULTS CONSOLE GC/CHLAMYDIA
Chlamydia: NEGATIVE
Gonorrhea: NEGATIVE

## 2016-12-18 LAB — OB RESULTS CONSOLE HIV ANTIBODY (ROUTINE TESTING): HIV: NONREACTIVE

## 2016-12-18 LAB — OB RESULTS CONSOLE HEPATITIS B SURFACE ANTIGEN: Hepatitis B Surface Ag: NEGATIVE

## 2016-12-18 LAB — OB RESULTS CONSOLE RPR: RPR: NONREACTIVE

## 2016-12-18 LAB — OB RESULTS CONSOLE RUBELLA ANTIBODY, IGM: Rubella: IMMUNE

## 2017-02-11 ENCOUNTER — Other Ambulatory Visit (HOSPITAL_COMMUNITY): Payer: Self-pay | Admitting: Obstetrics and Gynecology

## 2017-02-11 ENCOUNTER — Encounter (HOSPITAL_COMMUNITY): Payer: Self-pay

## 2017-02-11 DIAGNOSIS — Z3A19 19 weeks gestation of pregnancy: Secondary | ICD-10-CM

## 2017-02-11 DIAGNOSIS — Q6602 Congenital talipes equinovarus, left foot: Secondary | ICD-10-CM

## 2017-02-11 DIAGNOSIS — R9389 Abnormal findings on diagnostic imaging of other specified body structures: Secondary | ICD-10-CM

## 2017-02-11 DIAGNOSIS — Q681 Congenital deformity of finger(s) and hand: Secondary | ICD-10-CM

## 2017-02-11 DIAGNOSIS — Q6689 Other  specified congenital deformities of feet: Secondary | ICD-10-CM

## 2017-02-13 ENCOUNTER — Ambulatory Visit (HOSPITAL_COMMUNITY)
Admission: RE | Admit: 2017-02-13 | Discharge: 2017-02-13 | Disposition: A | Discharge status: Home / Self Care | Payer: Self-pay | Source: Ambulatory Visit | Attending: Obstetrics and Gynecology | Admitting: Obstetrics and Gynecology

## 2017-02-13 ENCOUNTER — Encounter (HOSPITAL_COMMUNITY): Payer: Self-pay

## 2017-03-18 NOTE — L&D Delivery Note (Signed)
Delivery Note At 8:26 PM a viable and healthy female was delivered via Vaginal, Spontaneous (Presentation: Left Occiput ;Anterior  ).  APGAR: 8, 9; weight  pending.   Placenta status: spontaneous, inact.  Cord: 3V   Anesthesia:  Epidural Episiotomy: None Lacerations:  1st degree Suture Repair: 3.0 vicryl Est. Blood Loss (mL):  150 cc  Mom to postpartum.  Baby to Couplet care / Skin to Skin.  Waynard ReedsKendra Shalan Neault 06/30/2017, 8:42 PM

## 2017-05-02 LAB — OB RESULTS CONSOLE GBS: GBS: NEGATIVE

## 2017-06-11 ENCOUNTER — Telehealth (HOSPITAL_COMMUNITY): Payer: Self-pay | Admitting: *Deleted

## 2017-06-13 ENCOUNTER — Other Ambulatory Visit: Payer: Self-pay | Admitting: Obstetrics and Gynecology

## 2017-06-17 ENCOUNTER — Inpatient Hospital Stay (HOSPITAL_COMMUNITY): Admission: RE | Admit: 2017-06-17 | Payer: Self-pay | Source: Ambulatory Visit

## 2017-06-26 ENCOUNTER — Other Ambulatory Visit: Payer: Self-pay | Admitting: Obstetrics and Gynecology

## 2017-06-30 ENCOUNTER — Inpatient Hospital Stay (HOSPITAL_COMMUNITY)
Admission: AD | Admit: 2017-06-30 | Discharge: 2017-07-02 | DRG: 807 | Disposition: A | Discharge status: Home / Self Care | Payer: Self-pay | Source: Ambulatory Visit | Attending: Obstetrics and Gynecology | Admitting: Obstetrics and Gynecology

## 2017-06-30 ENCOUNTER — Encounter (HOSPITAL_COMMUNITY): Payer: Self-pay | Admitting: Obstetrics

## 2017-06-30 ENCOUNTER — Observation Stay (HOSPITAL_COMMUNITY): Payer: Self-pay | Admitting: Anesthesiology

## 2017-06-30 DIAGNOSIS — Z87891 Personal history of nicotine dependence: Secondary | ICD-10-CM

## 2017-06-30 DIAGNOSIS — O329XX0 Maternal care for malpresentation of fetus, unspecified, not applicable or unspecified: Secondary | ICD-10-CM | POA: Diagnosis present

## 2017-06-30 DIAGNOSIS — Z3A39 39 weeks gestation of pregnancy: Secondary | ICD-10-CM

## 2017-06-30 LAB — CBC
HCT: 33.1 % — ABNORMAL LOW (ref 36.0–46.0)
Hemoglobin: 11 g/dL — ABNORMAL LOW (ref 12.0–15.0)
MCH: 30.1 pg (ref 26.0–34.0)
MCHC: 33.2 g/dL (ref 30.0–36.0)
MCV: 90.4 fL (ref 78.0–100.0)
Platelets: 176 10*3/uL (ref 150–400)
RBC: 3.66 MIL/uL — ABNORMAL LOW (ref 3.87–5.11)
RDW: 14.3 % (ref 11.5–15.5)
WBC: 9 10*3/uL (ref 4.0–10.5)

## 2017-06-30 LAB — RPR: RPR Ser Ql: NONREACTIVE

## 2017-06-30 LAB — TYPE AND SCREEN
ABO/RH(D): A POS
Antibody Screen: NEGATIVE

## 2017-06-30 MED ORDER — IBUPROFEN 600 MG PO TABS
600.0000 mg | ORAL_TABLET | Freq: Four times a day (QID) | ORAL | Status: DC
  Administered 2017-06-30 – 2017-07-02 (×6): 600 mg via ORAL
  Filled 2017-06-30 (×5): qty 1

## 2017-06-30 MED ORDER — PHENYLEPHRINE 40 MCG/ML (10ML) SYRINGE FOR IV PUSH (FOR BLOOD PRESSURE SUPPORT)
80.0000 ug | PREFILLED_SYRINGE | INTRAVENOUS | Status: DC | PRN
  Administered 2017-06-30: 80 ug via INTRAVENOUS
  Filled 2017-06-30: qty 5

## 2017-06-30 MED ORDER — ONDANSETRON HCL 4 MG/2ML IJ SOLN: 4.0000 mg | Freq: Four times a day (QID) | INTRAMUSCULAR | Status: DC | PRN

## 2017-06-30 MED ORDER — SOD CITRATE-CITRIC ACID 500-334 MG/5ML PO SOLN: 30.0000 mL | ORAL | Status: DC | PRN

## 2017-06-30 MED ORDER — DIPHENHYDRAMINE HCL 50 MG/ML IJ SOLN: 12.5000 mg | INTRAMUSCULAR | Status: DC | PRN

## 2017-06-30 MED ORDER — ACETAMINOPHEN 325 MG PO TABS
650.0000 mg | ORAL_TABLET | ORAL | Status: DC | PRN
  Administered 2017-07-01 (×2): 650 mg via ORAL
  Filled 2017-06-30 (×2): qty 2

## 2017-06-30 MED ORDER — LACTATED RINGERS IV SOLN
500.0000 mL | INTRAVENOUS | Status: DC | PRN
  Administered 2017-06-30: 500 mL via INTRAVENOUS

## 2017-06-30 MED ORDER — COCONUT OIL OIL
1.0000 "application " | TOPICAL_OIL | Status: DC | PRN
  Administered 2017-06-30: 1 via TOPICAL
  Filled 2017-06-30: qty 120

## 2017-06-30 MED ORDER — ONDANSETRON HCL 4 MG/2ML IJ SOLN: 4.0000 mg | INTRAMUSCULAR | Status: DC | PRN

## 2017-06-30 MED ORDER — LIDOCAINE HCL (PF) 1 % IJ SOLN
30.0000 mL | INTRAMUSCULAR | Status: DC | PRN
  Filled 2017-06-30: qty 30

## 2017-06-30 MED ORDER — PHENYLEPHRINE 40 MCG/ML (10ML) SYRINGE FOR IV PUSH (FOR BLOOD PRESSURE SUPPORT)
80.0000 ug | PREFILLED_SYRINGE | INTRAVENOUS | Status: DC | PRN
  Filled 2017-06-30: qty 5
  Filled 2017-06-30: qty 10

## 2017-06-30 MED ORDER — BENZOCAINE-MENTHOL 20-0.5 % EX AERO
1.0000 "application " | INHALATION_SPRAY | CUTANEOUS | Status: DC | PRN
  Administered 2017-06-30: 1 via TOPICAL
  Filled 2017-06-30: qty 56

## 2017-06-30 MED ORDER — SENNOSIDES-DOCUSATE SODIUM 8.6-50 MG PO TABS
2.0000 | ORAL_TABLET | ORAL | Status: DC
  Administered 2017-06-30 – 2017-07-01 (×2): 2 via ORAL
  Filled 2017-06-30 (×2): qty 2

## 2017-06-30 MED ORDER — FENTANYL 2.5 MCG/ML BUPIVACAINE 1/10 % EPIDURAL INFUSION (WH - ANES)
14.0000 mL/h | INTRAMUSCULAR | Status: DC | PRN
  Administered 2017-06-30 (×2): 14 mL/h via EPIDURAL
  Filled 2017-06-30 (×2): qty 100

## 2017-06-30 MED ORDER — SIMETHICONE 80 MG PO CHEW
80.0000 mg | CHEWABLE_TABLET | ORAL | Status: DC | PRN
  Filled 2017-06-30: qty 1

## 2017-06-30 MED ORDER — OXYCODONE-ACETAMINOPHEN 5-325 MG PO TABS: 1.0000 | ORAL_TABLET | ORAL | Status: DC | PRN

## 2017-06-30 MED ORDER — TETANUS-DIPHTH-ACELL PERTUSSIS 5-2.5-18.5 LF-MCG/0.5 IM SUSP: 0.5000 mL | Freq: Once | INTRAMUSCULAR | Status: DC

## 2017-06-30 MED ORDER — ACETAMINOPHEN 325 MG PO TABS: 650.0000 mg | ORAL_TABLET | ORAL | Status: DC | PRN

## 2017-06-30 MED ORDER — OXYTOCIN 40 UNITS IN LACTATED RINGERS INFUSION - SIMPLE MED
2.5000 [IU]/h | INTRAVENOUS | Status: DC
  Administered 2017-06-30: 2.5 [IU]/h via INTRAVENOUS
  Filled 2017-06-30: qty 1000

## 2017-06-30 MED ORDER — LACTATED RINGERS IV SOLN
INTRAVENOUS | Status: DC
  Administered 2017-06-30: 08:00:00 via INTRAVENOUS

## 2017-06-30 MED ORDER — LIDOCAINE HCL (PF) 1 % IJ SOLN
INTRAMUSCULAR | Status: DC | PRN
  Administered 2017-06-30 (×2): 5 mL via EPIDURAL

## 2017-06-30 MED ORDER — TERBUTALINE SULFATE 1 MG/ML IJ SOLN
0.2500 mg | Freq: Once | INTRAMUSCULAR | Status: DC | PRN
  Filled 2017-06-30: qty 1

## 2017-06-30 MED ORDER — ONDANSETRON HCL 4 MG PO TABS: 4.0000 mg | ORAL_TABLET | ORAL | Status: DC | PRN

## 2017-06-30 MED ORDER — OXYTOCIN BOLUS FROM INFUSION
500.0000 mL | Freq: Once | INTRAVENOUS | Status: AC
  Administered 2017-06-30: 500 mL via INTRAVENOUS

## 2017-06-30 MED ORDER — EPHEDRINE 5 MG/ML INJ
10.0000 mg | INTRAVENOUS | Status: DC | PRN
  Filled 2017-06-30: qty 2

## 2017-06-30 MED ORDER — DIPHENHYDRAMINE HCL 25 MG PO CAPS: 25.0000 mg | ORAL_CAPSULE | Freq: Four times a day (QID) | ORAL | Status: DC | PRN

## 2017-06-30 MED ORDER — METHYLERGONOVINE MALEATE 0.2 MG PO TABS: 0.2000 mg | ORAL_TABLET | ORAL | Status: DC | PRN

## 2017-06-30 MED ORDER — DIBUCAINE 1 % RE OINT: 1.0000 "application " | TOPICAL_OINTMENT | RECTAL | Status: DC | PRN

## 2017-06-30 MED ORDER — PRENATAL MULTIVITAMIN CH
1.0000 | ORAL_TABLET | Freq: Every day | ORAL | Status: DC
  Administered 2017-07-01: 1 via ORAL
  Filled 2017-06-30: qty 1

## 2017-06-30 MED ORDER — BUTORPHANOL TARTRATE 1 MG/ML IJ SOLN: 1.0000 mg | INTRAMUSCULAR | Status: DC | PRN

## 2017-06-30 MED ORDER — OXYTOCIN 40 UNITS IN LACTATED RINGERS INFUSION - SIMPLE MED
1.0000 m[IU]/min | INTRAVENOUS | Status: DC
  Administered 2017-06-30: 2 m[IU]/min via INTRAVENOUS

## 2017-06-30 MED ORDER — METHYLERGONOVINE MALEATE 0.2 MG/ML IJ SOLN: 0.2000 mg | INTRAMUSCULAR | Status: DC | PRN

## 2017-06-30 MED ORDER — LACTATED RINGERS IV SOLN
500.0000 mL | Freq: Once | INTRAVENOUS | Status: AC
  Administered 2017-06-30: 500 mL via INTRAVENOUS

## 2017-06-30 MED ORDER — OXYCODONE-ACETAMINOPHEN 5-325 MG PO TABS: 2.0000 | ORAL_TABLET | ORAL | Status: DC | PRN

## 2017-06-30 MED ORDER — ZOLPIDEM TARTRATE 5 MG PO TABS: 5.0000 mg | ORAL_TABLET | Freq: Every evening | ORAL | Status: DC | PRN

## 2017-06-30 MED ORDER — WITCH HAZEL-GLYCERIN EX PADS: 1.0000 "application " | MEDICATED_PAD | CUTANEOUS | Status: DC | PRN

## 2017-06-30 NOTE — Anesthesia Procedure Notes (Signed)
Epidural Patient location during procedure: OB Start time: 06/30/2017 11:08 AM End time: 06/30/2017 11:21 AM  Staffing Anesthesiologist: Heather RobertsSinger, Linh Hedberg, MD Performed: anesthesiologist   Preanesthetic Checklist Completed: patient identified, site marked, pre-op evaluation, timeout performed, IV checked, risks and benefits discussed and monitors and equipment checked  Epidural Patient position: sitting Prep: DuraPrep Patient monitoring: heart rate, cardiac monitor, continuous pulse ox and blood pressure Approach: midline Location: L2-L3 Injection technique: LOR saline  Needle:  Needle type: Tuohy  Needle gauge: 17 G Needle length: 9 cm Needle insertion depth: 5 cm Catheter size: 20 Guage Catheter at skin depth: 10 cm Test dose: negative and Other  Assessment Events: blood not aspirated, injection not painful, no injection resistance and negative IV test  Additional Notes Informed consent obtained prior to proceeding including risk of failure, 1% risk of PDPH, risk of minor discomfort and bruising.  Discussed rare but serious complications including epidural abscess, permanent nerve injury, epidural hematoma.  Discussed alternatives to epidural analgesia and patient desires to proceed.  Timeout performed pre-procedure verifying patient name, procedure, and platelet count.  Patient tolerated procedure well.

## 2017-06-30 NOTE — Anesthesia Preprocedure Evaluation (Signed)
Anesthesia Evaluation  Patient identified by MRN, date of birth, ID band Patient awake    Reviewed: Allergy & Precautions, H&P , Patient's Chart, lab work & pertinent test results  History of Anesthesia Complications Negative for: history of anesthetic complications  Airway Mallampati: II  TM Distance: >3 FB Neck ROM: full    Dental  (+) Dental Advisory Given   Pulmonary asthma ,    breath sounds clear to auscultation       Cardiovascular negative cardio ROS   Rhythm:regular Rate:Normal     Neuro/Psych negative neurological ROS  negative psych ROS   GI/Hepatic negative GI ROS, Neg liver ROS,   Endo/Other  negative endocrine ROS  Renal/GU      Musculoskeletal   Abdominal   Peds  Hematology negative hematology ROS (+)   Anesthesia Other Findings   Reproductive/Obstetrics (+) Pregnancy                             Anesthesia Physical  Anesthesia Plan  ASA: II  Anesthesia Plan: Epidural   Post-op Pain Management:    Induction:   PONV Risk Score and Plan:   Airway Management Planned: Natural Airway  Additional Equipment:   Intra-op Plan:   Post-operative Plan:   Informed Consent: I have reviewed the patients History and Physical, chart, labs and discussed the procedure including the risks, benefits and alternatives for the proposed anesthesia with the patient or authorized representative who has indicated his/her understanding and acceptance.   Dental advisory given  Plan Discussed with: Anesthesiologist  Anesthesia Plan Comments:         Anesthesia Quick Evaluation

## 2017-06-30 NOTE — Anesthesia Pain Management Evaluation Note (Signed)
  CRNA Pain Management Visit Note  Patient: Rachel Singleton, 37 y.o., female  "Hello I am a [facility]. We have an anesthesia team available at all times to provide care throughout the hospital, including epidural management and anesthesia for C-section. I don't know your plan for the delivery whether it a natural birth, water birth, IV sedation, nitrous supplementation, doula or epidural, but we want to meet your pain goals."   1.Was your pain managed to your expectations on prior hospitalizations?   Yes   2.What is your expectation for pain management during this hospitalization?     Epidural  3.How can we help you reach that goal?   Record the patient's initial score and the patient's pain goal.   Pain: 0  Pain Goal: 5 [facility] wants you to be able to say your pain was always managed very well.  Rachel Singleton,Rachel Singleton 06/30/2017

## 2017-06-30 NOTE — H&P (Signed)
Rachel Singleton is a 37 y.o. female presenting for IOL for AMA  37 yo G5P4004 @39 +2 PRESENTS FOR iol FOR ama. Her pregnancy has been complicated by AMA, abnormal FTS with increased DSR risk. Pt is s/p Amnio 46XY. Pregnancy has also been complicated by unstable lie. Vertex confirmed by bedside US today.  OB History    Gravida  5   Para  4   Term  4   Preterm      AB      Living  4     SAB      TAB      Ectopic      Multiple  0   Live Births  4          Past Medical History:  Diagnosis Date  . Asthma    pregnancy only  . Former smoker 2008   Quit 2008  . Hx of varicella    Past Surgical History:  Procedure Laterality Date  . NO PAST SURGERIES     Family History: family history includes Cystic fibrosis in her brother; Heart attack in her father; Heart disease in her father. Social History:  reports that she has never smoked. She has never used smokeless tobacco. She reports that she does not drink alcohol or use drugs.     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 Pt referred to MFM but cancelled after amnio normal. F/U US did NOT show clubbed foot. Bilateral 5th dig middle phalanges visualized Fetal Ultrasounds or other Referrals:  Referred to Materal Fetal Medicine  Maternal Substance Abuse:  No Significant Maternal Medications:  None Significant Maternal Lab Results:  None Other Comments:  None  ROS History Dilation: 3 Effacement (%): 50 Exam by:: Dr Tenny Crawoss Blood pressure 130/77, pulse 99, temperature 98.1 F (36.7 C), temperature source Oral, resp. rate 18, height 5\' 7"  (1.702 m), weight 79.4 kg (175 lb), last menstrual period 09/28/2016, unknown if currently breastfeeding. Exam Physical Exam  Prenatal  labs: ABO, Rh:  A Antibody:  pos Rubella: Immune (10/03 0000) RPR: Nonreactive (10/03 0000)  HBsAg: Negative (10/03 0000)  HIV: Non-reactive (10/03 0000)  GBS: Negative (02/15 0000)   Assessment/Plan: 1) Admit 2) Epidural on request 3) AROM/pit   Waynard ReedsKendra  06/30/2017, 10:14 AM

## 2017-07-01 ENCOUNTER — Other Ambulatory Visit: Payer: Self-pay

## 2017-07-01 LAB — CBC
HCT: 34.2 % — ABNORMAL LOW (ref 36.0–46.0)
Hemoglobin: 11.4 g/dL — ABNORMAL LOW (ref 12.0–15.0)
MCH: 30.4 pg (ref 26.0–34.0)
MCHC: 33.3 g/dL (ref 30.0–36.0)
MCV: 91.2 fL (ref 78.0–100.0)
Platelets: 167 10*3/uL (ref 150–400)
RBC: 3.75 MIL/uL — ABNORMAL LOW (ref 3.87–5.11)
RDW: 14.3 % (ref 11.5–15.5)
WBC: 13.2 10*3/uL — ABNORMAL HIGH (ref 4.0–10.5)

## 2017-07-01 NOTE — Progress Notes (Signed)
Patient is eating, ambulating, voiding.  Pain control is good.  Vitals:   06/30/17 2200 06/30/17 2239 06/30/17 2345 07/01/17 0420  BP: 113/74 119/71 119/74 115/79  Pulse: 96 85 92 76  Resp:  16 18 17   Temp:  99.8 F (37.7 C) 97.9 F (36.6 C) 98.1 F (36.7 C)  TempSrc:  Oral Oral Axillary  SpO2:      Weight:      Height:        Fundus firm Perineum without swelling.  Lab Results  Component Value Date   WBC 13.2 (H) 07/01/2017   HGB 11.4 (L) 07/01/2017   HCT 34.2 (L) 07/01/2017   MCV 91.2 07/01/2017   PLT 167 07/01/2017    --/--/A POS (04/15 0818)/RI  A/P Post partum day 1.  Routine care.  Expect d/c routine.    Khila Papp A

## 2017-07-01 NOTE — Anesthesia Postprocedure Evaluation (Signed)
Anesthesia Post Note  Patient: Rachel Singleton  Procedure(s) Performed: AN AD HOC LABOR EPIDURAL     Patient location during evaluation: Mother Baby Anesthesia Type: Epidural Level of consciousness: awake and alert and oriented Pain management: satisfactory to patient Vital Signs Assessment: post-procedure vital signs reviewed and stable Respiratory status: respiratory function stable Cardiovascular status: stable Postop Assessment: no headache, no backache, epidural receding, patient able to bend at knees, no signs of nausea or vomiting and adequate PO intake Anesthetic complications: no    Last Vitals:  Vitals:   07/01/17 0420 07/01/17 1113  BP: 115/79 111/90  Pulse: 76 93  Resp: 17 16  Temp: 36.7 C 36.6 C  SpO2:  99%    Last Pain:  Vitals:   07/01/17 1113  TempSrc: Oral  PainSc: 3    Pain Goal:                 Rachel Singleton

## 2017-07-01 NOTE — Lactation Note (Signed)
This note was copied from a baby's chart. Lactation Consultation Note  Patient Name: Rachel Jodi Geraldsmily Rogue ZOXWR'UToday's Date: 07/01/2017 Reason for consult: Initial assessment;Term P5 Experienced breastfeeding previous babies 15-16 months.  Mom reports newborn is latching and feeding well.  No questions or concerns.  Breastfeeding consultation services and support information given to patient.  Encouraged to call for assist prn.  Maternal Data Has patient been taught Hand Expression?: Yes Does the patient have breastfeeding experience prior to this delivery?: Yes  Feeding    LATCH Score                   Interventions    Lactation Tools Discussed/Used     Consult Status Consult Status: PRN    Huston FoleyMOULDEN, Chaitanya Amedee S 07/01/2017, 2:28 PM

## 2017-07-02 ENCOUNTER — Encounter (HOSPITAL_COMMUNITY): Payer: Self-pay | Admitting: *Deleted

## 2017-07-02 LAB — BIRTH TISSUE RECOVERY COLLECTION (PLACENTA DONATION)

## 2017-07-02 NOTE — Progress Notes (Signed)
PPD#2 Pt without complaints. Will discharge to home VSSAF Imp/ Stable Plan/ Discharge

## 2017-07-02 NOTE — Discharge Summary (Signed)
Obstetric Discharge Summary Reason for Admission: induction of labor Prenatal Procedures: NST and ultrasound Intrapartum Procedures: spontaneous vaginal delivery Postpartum Procedures: none Complications-Operative and Postpartum: 1st degree perineal laceration Hemoglobin  Date Value Ref Range Status  07/01/2017 11.4 (L) 12.0 - 15.0 g/dL Final   HCT  Date Value Ref Range Status  07/01/2017 34.2 (L) 36.0 - 46.0 % Final    Physical Exam:  General: alert and cooperative Lochia: appropriate   Discharge Diagnoses: Term Pregnancy-delivered and AMA  Discharge Information: Date: 07/02/2017 Activity: pelvic rest Diet: routine Medications: PNV and Ibuprofen Condition: stable Instructions: refer to practice specific booklet Discharge to: home Follow-up Information    Waynard Reedsoss, [name], MD. Schedule an appointment as soon as possible for a visit in 1 month(s).   Specialty:  Obstetrics and Gynecology Contact information: [address] [place] [address] [phone]           Newborn Data: Live born female  Birth Weight: 8 lb 2 oz (3685 g) APGAR: 7, 9  Newborn Delivery   Birth date/time:  06/30/2017 20:26:00 Delivery type:  Vaginal, Spontaneous     Home with mother.  ANDERSON,MARK E 07/02/2017, 8:55 AM

## 2017-08-29 NOTE — Telephone Encounter (Signed)
Preadmission screen  

## 2022-08-02 ENCOUNTER — Other Ambulatory Visit: Payer: Self-pay | Admitting: Obstetrics and Gynecology

## 2022-08-02 DIAGNOSIS — N6310 Unspecified lump in the right breast, unspecified quadrant: Secondary | ICD-10-CM

## 2022-08-19 ENCOUNTER — Ambulatory Visit
Admission: RE | Admit: 2022-08-19 | Discharge: 2022-08-19 | Disposition: A | Discharge status: Home / Self Care | Payer: Self-pay | Source: Ambulatory Visit | Attending: Obstetrics and Gynecology | Admitting: Obstetrics and Gynecology

## 2022-08-19 ENCOUNTER — Other Ambulatory Visit: Payer: Self-pay | Admitting: Obstetrics and Gynecology

## 2022-08-19 ENCOUNTER — Ambulatory Visit
Admission: RE | Admit: 2022-08-19 | Discharge: 2022-08-19 | Disposition: A | Discharge status: Home / Self Care | Payer: No Typology Code available for payment source | Source: Ambulatory Visit | Attending: Obstetrics and Gynecology | Admitting: Obstetrics and Gynecology

## 2022-08-19 DIAGNOSIS — N6310 Unspecified lump in the right breast, unspecified quadrant: Secondary | ICD-10-CM

## 2022-08-19 DIAGNOSIS — R921 Mammographic calcification found on diagnostic imaging of breast: Secondary | ICD-10-CM

## 2022-08-21 ENCOUNTER — Ambulatory Visit
Admission: RE | Admit: 2022-08-21 | Discharge: 2022-08-21 | Disposition: A | Discharge status: Home / Self Care | Payer: No Typology Code available for payment source | Source: Ambulatory Visit | Attending: Obstetrics and Gynecology | Admitting: Obstetrics and Gynecology

## 2022-08-21 DIAGNOSIS — R921 Mammographic calcification found on diagnostic imaging of breast: Secondary | ICD-10-CM

## 2022-08-21 DIAGNOSIS — N6310 Unspecified lump in the right breast, unspecified quadrant: Secondary | ICD-10-CM

## 2022-08-21 DIAGNOSIS — C50919 Malignant neoplasm of unspecified site of unspecified female breast: Secondary | ICD-10-CM

## 2022-08-21 HISTORY — PX: BREAST BIOPSY: SHX20

## 2022-08-21 HISTORY — DX: Malignant neoplasm of unspecified site of unspecified female breast: C50.919

## 2022-08-22 ENCOUNTER — Other Ambulatory Visit: Payer: Self-pay | Admitting: Obstetrics and Gynecology

## 2022-08-22 ENCOUNTER — Encounter: Payer: Self-pay | Admitting: Obstetrics and Gynecology

## 2022-08-22 DIAGNOSIS — N63 Unspecified lump in unspecified breast: Secondary | ICD-10-CM

## 2022-08-23 ENCOUNTER — Encounter: Payer: Self-pay | Admitting: *Deleted

## 2022-08-23 NOTE — Progress Notes (Signed)
Received request that patient would like to see Dr Myna Hidalgo for his new IDC diagnosis. She is scheduled for MRI on 6/17 and consultation with surgeon on 6/21.  Reached out to Forde Dandy to introduce myself as the office RN Navigator and explain our new patient process. Reviewed the reason for their referral and scheduled their new patient appointment along with labs. Provided address and directions to the office including call back phone number. Reviewed with patient any concerns they may have or any possible barriers to attending their appointment.   Informed patient about my role as a navigator and that I will meet with them prior to their New Patient appointment and more fully discuss what services I can provide. At this time patient has no further questions or needs.    Oncology Nurse Navigator Documentation     08/23/2022   10:00 AM  Oncology Nurse Navigator Flowsheets  Abnormal Finding Date 08/19/2022  Confirmed Diagnosis Date 08/21/2022  Diagnosis Status Additional Work Up  Navigator Follow Up Date: 09/04/2022  Navigator Follow Up Reason: New Patient Appointment  Navigator Location [facility]  Referral Date to RadOnc/MedOnc 08/23/2022  Navigator Encounter Type Introductory Phone Call  Patient Visit Type MedOnc  Treatment Phase Pre-Tx/Tx Discussion  Barriers/Navigation Needs Coordination of Care;Education  Education Other  Interventions Coordination of Care;Education  Acuity Level 2-Minimal Needs (1-2 Barriers Identified)  Coordination of Care Appts  Education Method Verbal  Support Groups/Services Friends and Family  Time Spent with Patient 30

## 2022-08-26 ENCOUNTER — Ambulatory Visit: Payer: Self-pay | Admitting: Surgery

## 2022-08-26 DIAGNOSIS — C50911 Malignant neoplasm of unspecified site of right female breast: Secondary | ICD-10-CM

## 2022-08-26 NOTE — H&P (View-Only) (Signed)
Subjective    Chief Complaint: Breast Cancer       History of Present Illness: Rachel Singleton is a 41 y.o. female who is seen today as an office consultation at the request of Dr. Ross for evaluation of Breast Cancer .     This is a healthy 41 year old female with a family history of breast cancer in her mother in her 50's who presented recently with a palpable mass in the right upper inner quadrant.  No previous mammograms.  She underwent diagnostic mammograms and ultrasound that showed a 3 cm mass in the right breast at 2:00 sitting on the pectoralis muscle.  She also has diffuse calcifications bilaterally - R>L.  She underwent biopsy of two areas of calcifications on the right, as well as the right breast mass.  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  MRI has been ordered for 6/17.  She has an appointment with Oncology - Dr. Ennever 6/19.  No previous surgeries.  She did breastfeed for about 10 years.  Her youngest child is 5.  LMP 3 weeks ago.         Review of Systems: A complete review of systems was obtained from the patient.  I have reviewed this information and discussed as appropriate with the patient.  See HPI as well for other ROS.   Review of Systems  Constitutional: Negative.   HENT: Negative.    Eyes: Negative.   Respiratory: Negative.    Cardiovascular: Negative.   Gastrointestinal: Negative.   Genitourinary: Negative.   Musculoskeletal: Negative.   Skin: Negative.   Neurological: Negative.   Endo/Heme/Allergies: Negative.   Psychiatric/Behavioral: Negative.          Medical History: Past Medical History  History reviewed. No pertinent past medical history.     Problem List     Patient Active Problem List  Diagnosis   Family history of hypothyroidism   Invasive ductal carcinoma of breast, female, right (CMS/HHS-HCC)        Past Surgical History  History reviewed. No  pertinent surgical history.      Allergies  No Known Allergies     Medications Ordered Prior to Encounter  No current outpatient medications on file prior to visit.    No current facility-administered medications on file prior to visit.        Family History       Family History  Problem Relation Age of Onset   Breast cancer Mother     Stroke Father     Coronary Artery Disease (Blocked arteries around heart) Father          Tobacco Use History  Social History        Tobacco Use  Smoking Status Former   Types: Cigarettes   Start date: 2010  Smokeless Tobacco Never        Social History  Social History         Socioeconomic History   Marital status: Married  Tobacco Use   Smoking status: Former      Types: Cigarettes      Start date: 2010   Smokeless tobacco: Never  Substance and Sexual Activity   Alcohol use: Never   Drug use: Never    Social Determinants of Health      Received from [facility]    Social Network        Objective:            Vitals:    08/26/22 1358 08/26/22 1359  BP: 112/68    Pulse: 94    Temp: 37.4 C (99.4 F)    SpO2: 98%    Weight: 66 kg (145 lb 6.4 oz)    Height: 170.2 cm (5' 7")    PainSc:   0-No pain    Body mass index is 22.77 kg/m.   Physical Exam    Constitutional:  WDWN in NAD, conversant, no obvious deformities; lying in bed comfortably Eyes:  Pupils equal, round; sclera anicteric; moist conjunctiva; no lid lag HENT:  Oral mucosa moist; good dentition  Neck:  No masses palpated, trachea midline; no thyromegaly Lungs:  CTA bilaterally; normal respiratory effort Breasts:  symmetric, no nipple changes; some ecchymosis in right lower breast. no palpable masses or lymphadenopathy on left side.  No right lymphadenopathy.  Right upper inner quadrant shows an indistinct 3 cm mass about 5 cm from the nipple - does not appear fixed to the pectoralis. CV:  Regular rate and rhythm; no murmurs; extremities  well-perfused with no edema Abd:  +bowel sounds, soft, non-tender, no palpable organomegaly; no palpable hernias Musc:  Normal gait; no apparent clubbing or cyanosis in extremities Lymphatic:  No palpable cervical or axillary lymphadenopathy Skin:  Warm, dry; no sign of jaundice Psychiatric - alert and oriented x 4; calm mood and affect     Labs, Imaging and Diagnostic Testing:   Diagnosis 1. Breast, right, needle core biopsy, upper central calcifications x clip - BENIGN BREAST TISSUE WITH ADENOSIS AND CALCIFICATIONS - NO MALIGNANCY IDENTIFIED 2. Breast, right, needle core biopsy, lower central calcifications coil clip - BENIGN BREAST TISSUE WITH ADENOSIS AND CALCIFICATIONS - NO MALIGNANCY IDENTIFIED 3. Breast, right, needle core biopsy, 2 o'clock venus clip - INVASIVE DUCTAL CARCINOMA, SEE NOTE - TUBULE FORMATION: SCORE 3 - NUCLEAR PLEOMORPHISM: SCORE 3 - MITOTIC COUNT: SCORE 2 - TOTAL SCORE: 8 - OVERALL GRADE: 3 - LYMPHOVASCULAR INVASION: NOT IDENTIFIED - CANCER LENGTH: 1.0 CM - CALCIFICATIONS: NOT IDENTIFIED IMMUNOHISTOCHEMICAL AND MORPHOMETRIC ANALYSIS PERFORMED MANUALLY The tumor cells are NEGATIVE for Her2 (0). Estrogen Receptor: 95%, POSITIVE, MODERATE-STRONG STAINING INTENSITY Progesterone Receptor: 95%, POSITIVE, MODERATE-STRONG STAINING INTENSITY Proliferation Marker Ki67: 25% REFERENCE RANGE ESTROGEN RECEPTOR NEGATIVE 0% POSITIVE =>1% REFERENCE RANGE PROGESTERONE RECEPTOR NEGATIVE 0% POSITIVE =>1% All controls stained appropriately [name] MD Pathologist, Electronic Signature ( Signed 08/23/2022)     CLINICAL DATA:  41-year-old female presenting for evaluation of a new lump in the right breast. Patient reports a new lump in the right breast for approximately 3 weeks. Baseline evaluation.   EXAM: DIGITAL DIAGNOSTIC BILATERAL MAMMOGRAM WITH TOMOSYNTHESIS; ULTRASOUND RIGHT BREAST LIMITED   TECHNIQUE: Bilateral digital diagnostic mammography and  breast tomosynthesis was performed.; Targeted ultrasound examination of the right breast was performed   COMPARISON:  None available.   ACR Breast Density Category b: There are scattered areas of fibroglandular density.   FINDINGS: Mammogram:   Right breast: A skin BB marks the palpable site of concern reported by the patient in the upper inner right breast. On the spot 2D magnification views performed there is a partially visualized irregular mass at the palpable site measuring at least 2.0 cm. There are diffuse punctate calcifications throughout the right breast which are asymmetrically increased compared to the left side.   Left breast: No suspicious mass or asymmetry. There are diffuse punctate calcifications predominantly in the outer left breast.   On physical exam at the site of concern reported by the patient in the upper   inner right breast I feel a discrete fixed mass.   Ultrasound:   Targeted ultrasound is performed at the palpable site of concern in the right breast at 2 o'clock 6 cm from the nipple demonstrating an irregular predominantly hyperechoic mass. The mass has indistinct margins making measurements difficult but measures approximately 3.0 x 1.4 x 2.5 cm. Mass is on top of the pectoralis muscle. Targeted ultrasound of the right axilla demonstrates no abnormal lymph nodes.   IMPRESSION: 1. Suspicious mass at the palpable site of concern in the right breast at 2 o'clock measuring approximately 3.0 cm. Measurements somewhat difficult due to indistinct margins. The mass sits on top of the pectoralis muscle. 2. Indeterminate diffuse calcifications throughout the right breast which are asymmetrically increased compared to the left side.   RECOMMENDATION: 1. Ultrasound-guided core needle biopsy x1 targeting the palpable mass in the right breast at 2 o'clock.   2. Stereotactic core needle biopsy x2 of the right breast targeting two sites of calcifications at  the discretion of the radiologist performing the biopsy.   3. If the right breast calcifications demonstrate atypia or malignancy recommend biopsy of the left breast calcifications.   4. If the mass in the right breast demonstrates malignancy recommend breast MRI given mass is sitting on the pectoralis muscle.   I have discussed the findings and recommendations with the patient. If applicable, a reminder letter will be sent to the patient regarding the next appointment.   BI-RADS CATEGORY  4: Suspicious.     Electronically Signed   By: Nancy  Ballantyne M.D.   On: 08/19/2022 09:45   Assessment and Plan:  Diagnoses and all orders for this visit:   Invasive ductal carcinoma of breast, female, right (CMS/HHS-HCC) -     Ambulatory Referral to Radiation Oncology -     Ambulatory Referral to Cancer Genetics - [id number] -     Ambulatory Referral to Plastic Surgery     I had a long discussion with the patient and her husband about the diagnosis and the expected treatment options.  We discussed breast conservation with lumpectomy/ SLNB vs. Mastectomy with SLNB.  We discussed risk-reducing contralateral mastectomy, as well as genetic testing.  We discussed the possibility of neoadjuvant chemotherapy to see if the tumor responded, making breast conservation possible.  After a long discussion, she is leaning towards right mastectomy with SLNB/ risk-reducing left mastectomy with immediate reconstruction.  She will hopefully be a candidate for nipple-sparing mastectomy.  We will await the results of her MRI.   We will begin the surgery scheduling process while referring the patient to Dr. Thimmappa to discuss reconstruction.  We will also refer her to Genetics, but she does not want to wait until genetic testing has returned before proceeding with surgery.  The patient requests [facility] if possible.     Right mastectomy with SLNB/ risk-reducing left mastectomy with immediate  reconstruction.  The surgical procedure has been discussed with the patient.  Potential risks, benefits, alternative treatments, and expected outcomes have been explained.  All of the patient's questions at this time have been answered.  The likelihood of reaching the patient's treatment goal is good.  The patient understand the proposed surgical procedure and wishes to proceed.       Psalm Arman KAI Atalie Oros, MD  08/26/2022 3:16 PM   

## 2022-08-26 NOTE — H&P (Signed)
Subjective    Chief Complaint: Breast Cancer       History of Present Illness: Dayani Oberlander is a 42 y.o. female who is seen today as an office consultation at the request of Dr. Tenny Craw for evaluation of Breast Cancer .     This is a healthy 42 year old female with a family history of breast cancer in her mother in her 68's who presented recently with a palpable mass in the right upper inner quadrant.  No previous mammograms.  She underwent diagnostic mammograms and ultrasound that showed a 3 cm mass in the right breast at 2:00 sitting on the pectoralis muscle.  She also has diffuse calcifications bilaterally - R>L.  She underwent biopsy of two areas of calcifications on the right, as well as the right breast mass.  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  MRI has been ordered for 6/17.  She has an appointment with Oncology - Dr. Myna Hidalgo 6/19.  No previous surgeries.  She did breastfeed for about 10 years.  Her youngest child is 5.  LMP 3 weeks ago.         Review of Systems: A complete review of systems was obtained from the patient.  I have reviewed this information and discussed as appropriate with the patient.  See HPI as well for other ROS.   Review of Systems  Constitutional: Negative.   HENT: Negative.    Eyes: Negative.   Respiratory: Negative.    Cardiovascular: Negative.   Gastrointestinal: Negative.   Genitourinary: Negative.   Musculoskeletal: Negative.   Skin: Negative.   Neurological: Negative.   Endo/Heme/Allergies: Negative.   Psychiatric/Behavioral: Negative.          Medical History: Past Medical History  History reviewed. No pertinent past medical history.     Problem List     Patient Active Problem List  Diagnosis   Family history of hypothyroidism   Invasive ductal carcinoma of breast, female, right (CMS/HHS-HCC)        Past Surgical History  History reviewed. No  pertinent surgical history.      Allergies  No Known Allergies     Medications Ordered Prior to Encounter  No current outpatient medications on file prior to visit.    No current facility-administered medications on file prior to visit.        Family History       Family History  Problem Relation Age of Onset   Breast cancer Mother     Stroke Father     Coronary Artery Disease (Blocked arteries around heart) Father          Tobacco Use History  Social History        Tobacco Use  Smoking Status Former   Types: Cigarettes   Start date: 2010  Smokeless Tobacco Never        Social History  Social History         Socioeconomic History   Marital status: Married  Tobacco Use   Smoking status: Former      Types: Cigarettes      Start date: 2010   Smokeless tobacco: Never  Substance and Sexual Activity   Alcohol use: Never   Drug use: Never    Social Determinants of Health      Received from [employer]    Social Network        Objective:  Vitals:    08/26/22 1358 08/26/22 1359  BP: 112/68    Pulse: 94    Temp: 37.4 C (99.4 F)    SpO2: 98%    Weight: 66 kg (145 lb 6.4 oz)    Height: 170.2 cm (5\' 7" )    PainSc:   0-No pain    Body mass index is 22.77 kg/m.   Physical Exam    Constitutional:  WDWN in NAD, conversant, no obvious deformities; lying in bed comfortably Eyes:  Pupils equal, round; sclera anicteric; moist conjunctiva; no lid lag HENT:  Oral mucosa moist; good dentition  Neck:  No masses palpated, trachea midline; no thyromegaly Lungs:  CTA bilaterally; normal respiratory effort Breasts:  symmetric, no nipple changes; some ecchymosis in right lower breast. no palpable masses or lymphadenopathy on left side.  No right lymphadenopathy.  Right upper inner quadrant shows an indistinct 3 cm mass about 5 cm from the nipple - does not appear fixed to the pectoralis. CV:  Regular rate and rhythm; no murmurs; extremities  well-perfused with no edema Abd:  +bowel sounds, soft, non-tender, no palpable organomegaly; no palpable hernias Musc:  Normal gait; no apparent clubbing or cyanosis in extremities Lymphatic:  No palpable cervical or axillary lymphadenopathy Skin:  Warm, dry; no sign of jaundice Psychiatric - alert and oriented x 4; calm mood and affect     Labs, Imaging and Diagnostic Testing:   Diagnosis 1. Breast, right, needle core biopsy, upper central calcifications x clip - BENIGN BREAST TISSUE WITH ADENOSIS AND CALCIFICATIONS - NO MALIGNANCY IDENTIFIED 2. Breast, right, needle core biopsy, lower central calcifications coil clip - BENIGN BREAST TISSUE WITH ADENOSIS AND CALCIFICATIONS - NO MALIGNANCY IDENTIFIED 3. Breast, right, needle core biopsy, 2 o'clock venus clip - INVASIVE DUCTAL CARCINOMA, SEE NOTE - TUBULE FORMATION: SCORE 3 - NUCLEAR PLEOMORPHISM: SCORE 3 - MITOTIC COUNT: SCORE 2 - TOTAL SCORE: 8 - OVERALL GRADE: 3 - LYMPHOVASCULAR INVASION: NOT IDENTIFIED - CANCER LENGTH: 1.0 CM - CALCIFICATIONS: NOT IDENTIFIED IMMUNOHISTOCHEMICAL AND MORPHOMETRIC ANALYSIS PERFORMED MANUALLY The tumor cells are NEGATIVE for Her2 (0). Estrogen Receptor: 95%, POSITIVE, MODERATE-STRONG STAINING INTENSITY Progesterone Receptor: 95%, POSITIVE, MODERATE-STRONG STAINING INTENSITY Proliferation Marker Ki67: 25% REFERENCE RANGE ESTROGEN RECEPTOR NEGATIVE 0% POSITIVE =>1% REFERENCE RANGE PROGESTERONE RECEPTOR NEGATIVE 0% POSITIVE =>1% All controls stained appropriately Jerene Bears MD Pathologist, Electronic Signature ( Signed 08/23/2022)     CLINICAL DATA:  43 year old female presenting for evaluation of a new lump in the right breast. Patient reports a new lump in the right breast for approximately 3 weeks. Baseline evaluation.   EXAM: DIGITAL DIAGNOSTIC BILATERAL MAMMOGRAM WITH TOMOSYNTHESIS; ULTRASOUND RIGHT BREAST LIMITED   TECHNIQUE: Bilateral digital diagnostic mammography and  breast tomosynthesis was performed.; Targeted ultrasound examination of the right breast was performed   COMPARISON:  None available.   ACR Breast Density Category b: There are scattered areas of fibroglandular density.   FINDINGS: Mammogram:   Right breast: A skin BB marks the palpable site of concern reported by the patient in the upper inner right breast. On the spot 2D magnification views performed there is a partially visualized irregular mass at the palpable site measuring at least 2.0 cm. There are diffuse punctate calcifications throughout the right breast which are asymmetrically increased compared to the left side.   Left breast: No suspicious mass or asymmetry. There are diffuse punctate calcifications predominantly in the outer left breast.   On physical exam at the site of concern reported by the patient in the upper  inner right breast I feel a discrete fixed mass.   Ultrasound:   Targeted ultrasound is performed at the palpable site of concern in the right breast at 2 o'clock 6 cm from the nipple demonstrating an irregular predominantly hyperechoic mass. The mass has indistinct margins making measurements difficult but measures approximately 3.0 x 1.4 x 2.5 cm. Mass is on top of the pectoralis muscle. Targeted ultrasound of the right axilla demonstrates no abnormal lymph nodes.   IMPRESSION: 1. Suspicious mass at the palpable site of concern in the right breast at 2 o'clock measuring approximately 3.0 cm. Measurements somewhat difficult due to indistinct margins. The mass sits on top of the pectoralis muscle. 2. Indeterminate diffuse calcifications throughout the right breast which are asymmetrically increased compared to the left side.   RECOMMENDATION: 1. Ultrasound-guided core needle biopsy x1 targeting the palpable mass in the right breast at 2 o'clock.   2. Stereotactic core needle biopsy x2 of the right breast targeting two sites of calcifications at  the discretion of the radiologist performing the biopsy.   3. If the right breast calcifications demonstrate atypia or malignancy recommend biopsy of the left breast calcifications.   4. If the mass in the right breast demonstrates malignancy recommend breast MRI given mass is sitting on the pectoralis muscle.   I have discussed the findings and recommendations with the patient. If applicable, a reminder letter will be sent to the patient regarding the next appointment.   BI-RADS CATEGORY  4: Suspicious.     Electronically Signed   By: Emmaline Kluver M.D.   On: 08/19/2022 09:45   Assessment and Plan:  Diagnoses and all orders for this visit:   Invasive ductal carcinoma of breast, female, right (CMS/HHS-HCC) -     Ambulatory Referral to Radiation Oncology -     Ambulatory Referral to Cancer Genetics [phone] -     Ambulatory Referral to Plastic Surgery     I had a long discussion with the patient and her husband about the diagnosis and the expected treatment options.  We discussed breast conservation with lumpectomy/ SLNB vs. Mastectomy with SLNB.  We discussed risk-reducing contralateral mastectomy, as well as genetic testing.  We discussed the possibility of neoadjuvant chemotherapy to see if the tumor responded, making breast conservation possible.  After a long discussion, she is leaning towards right mastectomy with SLNB/ risk-reducing left mastectomy with immediate reconstruction.  She will hopefully be a candidate for nipple-sparing mastectomy.  We will await the results of her MRI.   We will begin the surgery scheduling process while referring the patient to Dr. Leta Baptist to discuss reconstruction.  We will also refer her to Genetics, but she does not want to wait until genetic testing has returned before proceeding with surgery.  The patient requests [facility] if possible.     Right mastectomy with SLNB/ risk-reducing left mastectomy with immediate  reconstruction.  The surgical procedure has been discussed with the patient.  Potential risks, benefits, alternative treatments, and expected outcomes have been explained.  All of the patient's questions at this time have been answered.  The likelihood of reaching the patient's treatment goal is good.  The patient understand the proposed surgical procedure and wishes to proceed.       Lissa Morales, MD  08/26/2022 3:16 PM

## 2022-08-28 ENCOUNTER — Telehealth: Payer: Self-pay | Admitting: Genetic Counselor

## 2022-08-28 ENCOUNTER — Ambulatory Visit: Payer: Self-pay | Admitting: Surgery

## 2022-08-28 NOTE — Telephone Encounter (Signed)
Patient is aware of upcoming appointment time/date.

## 2022-09-01 DIAGNOSIS — C50211 Malignant neoplasm of upper-inner quadrant of right female breast: Secondary | ICD-10-CM | POA: Insufficient documentation

## 2022-09-01 NOTE — Progress Notes (Signed)
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   HISTORY OF PRESENT ILLNESS: Rachel Singleton is a 42 y.o. female seen in the multidisciplinary breast clinic for a new diagnosis of right breast cancer. The patient was noted to have a palpable mass in the right breast.  She has not had prior mammograms but went for diagnostic imaging including ultrasound that showed a mass in the 2 o'clock position of the right breast which measured 3 cm in greatest dimension. Indeterminant diffuse calcifications throughout the right breast were also noted.  It is notable that her mass is on top of the pectoralis muscle.  Her axilla was negative for any adenopathy.  Calcifications in the upper central and lower central breast were benign consistent with adenosis and calcification with no malignancy.  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  She is seen to discuss treatment recommendations in the adjuvant setting.  She has met with Dr. Corliss Skains and has plans for a right mastectomy with sentinel lymph node biopsy.  She is also planning to meet with genetic counseling given her family history and young age at diagnosis.    PREVIOUS RADIATION THERAPY: {EXAM; YES/NO:19492::"No"}   PAST MEDICAL HISTORY:  Past Medical History:  Diagnosis Date   Asthma    pregnancy only   Former smoker 2008   Quit 2008   Hx of varicella        PAST SURGICAL HISTORY: Past Surgical History:  Procedure Laterality Date   BREAST BIOPSY Right 08/21/2022   MM RT BREAST BX W LOC DEV 1ST LESION IMAGE BX SPEC STEREO  GUIDE 08/21/2022 [facility]   BREAST BIOPSY Right 08/21/2022   MM RT BREAST BX W LOC DEV EA AD LESION IMG BX SPEC STEREO GUIDE 08/21/2022 [facility]   BREAST BIOPSY Right 08/21/2022   [place] RT BREAST BX W LOC DEV 1ST LESION IMG BX SPEC US GUIDE 08/21/2022 [facility]   NO PAST SURGERIES       FAMILY HISTORY:  Family History  Problem Relation Age of Onset   Breast cancer Mother 4   Heart disease Father    Heart attack Father    Cystic fibrosis Brother      SOCIAL HISTORY:  reports that she has never smoked. She has never used smokeless tobacco. She reports that she does not drink alcohol and does not use drugs.  The patient is married and lives in [place].  She ***   ALLERGIES: Patient has no known allergies.   MEDICATIONS:  Current Outpatient Medications  Medication Sig Dispense Refill   acetaminophen (TYLENOL) 325 MG tablet Take 650 mg by mouth every 6 (six) hours as needed for mild pain.     loratadine (CLARITIN) 10 MG tablet Take 10 mg by mouth daily.     Prenatal Vit-Fe Fumarate-FA (PRENATAL MULTIVITAMIN) TABS tablet Take 1 tablet by mouth daily at 12 noon.     No current facility-administered medications for this visit.     REVIEW OF SYSTEMS: On review of systems, the  patient reports that she is doing ***     PHYSICAL EXAM:  Wt Readings from Last 3 Encounters:  06/30/17 175 lb (79.4 kg)  06/01/14 174 lb (78.9 kg)  02/09/13 137 lb (62.1 kg)   Temp Readings from Last 3 Encounters:  07/02/17 (!) 97.4 F (36.3 C) (Axillary)  06/03/14 97.6 F (36.4 C) (Oral)  02/09/13 97.8 F (36.6 C) (Oral)   BP Readings from Last 3 Encounters:  07/02/17 127/70  06/03/14 108/76  02/09/13 101/65   Pulse Readings from Last 3 Encounters:  07/02/17 67  06/03/14 69  02/09/13 76    In general this is a well appearing Caucasian female in no acute distress. She's alert and oriented x4 and appropriate throughout the examination. Cardiopulmonary assessment is  negative for acute distress and she exhibits normal effort. Bilateral breast exam is deferred.    ECOG = ***  0 - Asymptomatic (Fully active, able to carry on all predisease activities without restriction)  1 - Symptomatic but completely ambulatory (Restricted in physically strenuous activity but ambulatory and able to carry out work of a light or sedentary nature. For example, light housework, office work)  2 - Symptomatic, <50% in bed during the day (Ambulatory and capable of all self care but unable to carry out any work activities. Up and about more than 50% of waking hours)  3 - Symptomatic, >50% in bed, but not bedbound (Capable of only limited self-care, confined to bed or chair 50% or more of waking hours)  4 - Bedbound (Completely disabled. Cannot carry on any self-care. Totally confined to bed or chair)  5 - Death   Santiago Glad MM, Creech RH, Tormey DC, et al. [phone]). "Toxicity and response criteria of the [facility]". Am. Evlyn Clines. Oncol. 5 (6): 649-55    LABORATORY DATA:  Lab Results  Component Value Date   WBC 13.2 (H) 07/01/2017   HGB 11.4 (L) 07/01/2017   HCT 34.2 (L) 07/01/2017   MCV 91.2 07/01/2017   PLT 167 07/01/2017   Lab Results  Component Value Date   NA 142 02/01/2013   K 3.9 02/01/2013   CL 106 02/01/2013   CO2 26 02/01/2013   No results found for: "ALT", "AST", "GGT", "ALKPHOS", "BILITOT"    RADIOGRAPHY: MM RT BREAST BX W LOC DEV 1ST LESION IMAGE BX SPEC STEREO GUIDE  Addendum Date: 08/22/2022   ADDENDUM REPORT: 08/22/2022 16:05 ADDENDUM: PATHOLOGY revealed: Site 1. Breast, RIGHT, needle core biopsy, upper central calcifications x clip - BENIGN BREAST TISSUE WITH ADENOSIS AND CALCIFICATIONS - NO MALIGNANCY IDENTIFIED Pathology results are CONCORDANT with imaging findings, per Dr. Emmaline Kluver. PATHOLOGY revealed: Site 2. Breast, RIGHT, needle core biopsy, lower central calcifications coil clip - BENIGN BREAST TISSUE WITH ADENOSIS  AND CALCIFICATIONS - NO MALIGNANCY IDENTIFIED Pathology results are CONCORDANT with imaging findings, per Dr. Emmaline Kluver. PATHOLOGY revealed: Site 3. Breast, RIGHT, needle core biopsy, 2 o'clock venus clip - INVASIVE DUCTAL CARCINOMA, SEE NOTE - TUBULE FORMATION: SCORE 3 - NUCLEAR PLEOMORPHISM: SCORE 3 - MITOTIC COUNT: SCORE 2 - TOTAL SCORE: 8 - OVERALL GRADE: 3 - LYMPHOVASCULAR INVASION: NOT IDENTIFIED CANCER LENGTH: 1.0 CM - CALCIFICATIONS: NOT IDENTIFIED Pathology results are CONCORDANT with imaging findings, per Dr. Emmaline Kluver. Pathology results and recommendations below were discussed with patient by telephone on 08/22/2022. Patient reported biopsy site within normal limits with slight tenderness at the site. Post biopsy care instructions were reviewed, questions were answered and my direct phone number was provided to  patient. Patient was instructed to call [facility] if any concerns or questions arise related to the biopsy. The patient was referred to the [facility] at [facility] with appointment on 09/04/2022. RECOMMENDATION: The patient was recommended to return for a bilateral breast MRI due to far posterior location of malignant mass near the pectoralis muscle. A secure email was sent to [facility] schedulers. Pathology results reported by Lynett Grimes, RN on 08/22/2022. Electronically Signed   By: Emmaline Kluver M.D.   On: 08/22/2022 16:05   Result Date: 08/22/2022 CLINICAL DATA:  42 year old female presenting for biopsy of calcifications in the right breast. EXAM: RIGHT BREAST STEREOTACTIC CORE NEEDLE BIOPSY x 2 COMPARISON:  Previous exam(s). FINDINGS: The patient and I discussed the procedure of stereotactic-guided biopsy including benefits and alternatives. We discussed the high likelihood of a successful procedure. We discussed the risks of the procedure including infection, bleeding, tissue injury, clip  migration, and inadequate sampling. Informed written consent was given. The usual time out protocol was performed immediately prior to the procedure. Using sterile technique and 1% Lidocaine as local anesthetic, under stereotactic guidance, a 9 gauge vacuum assisted device was used to perform core needle biopsy of calcifications in the upper central right breast using a lateral approach. Specimen radiograph was performed showing several specimens with calcifications. Specimens with calcifications are identified for pathology. Lesion quadrant: Upper outer quadrant At the conclusion of the procedure, an X shaped tissue marker clip was deployed into the biopsy cavity. Follow-up 2-view mammogram was performed and dictated separately. Using sterile technique and 1% Lidocaine as local anesthetic, under stereotactic guidance, a 9 gauge vacuum assisted device was used to perform core needle biopsy of calcifications in the central inferior right breast using a medial approach. Specimen radiograph was performed showing several specimens with calcifications. Specimens with calcifications are identified for pathology. Lesion quadrant: Lower inner quadrant At the conclusion of the procedure, a coil shaped tissue marker clip was deployed into the biopsy cavity. Follow-up 2-view mammogram was performed and dictated separately. IMPRESSION: Stereotactic-guided biopsy of calcifications in the upper central and central inferior right breast. No apparent complications. Electronically Signed: By: Emmaline Kluver M.D. On: 08/21/2022 12:06  MM RT BREAST BX W LOC DEV EA AD LESION IMG BX SPEC STEREO GUIDE  Addendum Date: 08/22/2022   ADDENDUM REPORT: 08/22/2022 16:05 ADDENDUM: PATHOLOGY revealed: Site 1. Breast, RIGHT, needle core biopsy, upper central calcifications x clip - BENIGN BREAST TISSUE WITH ADENOSIS AND CALCIFICATIONS - NO MALIGNANCY IDENTIFIED Pathology results are CONCORDANT with imaging findings, per Dr. Emmaline Kluver.  PATHOLOGY revealed: Site 2. Breast, RIGHT, needle core biopsy, lower central calcifications coil clip - BENIGN BREAST TISSUE WITH ADENOSIS AND CALCIFICATIONS - NO MALIGNANCY IDENTIFIED Pathology results are CONCORDANT with imaging findings, per Dr. Emmaline Kluver. PATHOLOGY revealed: Site 3. Breast, RIGHT, needle core biopsy, 2 o'clock venus clip - INVASIVE DUCTAL CARCINOMA, SEE NOTE - TUBULE FORMATION: SCORE 3 - NUCLEAR PLEOMORPHISM: SCORE 3 - MITOTIC COUNT: SCORE 2 - TOTAL SCORE: 8 - OVERALL GRADE: 3 - LYMPHOVASCULAR INVASION: NOT IDENTIFIED CANCER LENGTH: 1.0 CM - CALCIFICATIONS: NOT IDENTIFIED Pathology results are CONCORDANT with imaging findings, per Dr. Emmaline Kluver. Pathology results and recommendations below were discussed with patient by telephone on 08/22/2022. Patient reported biopsy site within normal limits with slight tenderness at the site. Post biopsy care instructions were reviewed, questions were answered and my direct phone number was provided to patient. Patient was instructed to call  [facility] if any concerns or questions arise related to the biopsy. The patient was referred to the [facility] at [facility] with appointment on 09/04/2022. RECOMMENDATION: The patient was recommended to return for a bilateral breast MRI due to far posterior location of malignant mass near the pectoralis muscle. A secure email was sent to [facility] schedulers. Pathology results reported by Lynett Grimes, RN on 08/22/2022. Electronically Signed   By: Emmaline Kluver M.D.   On: 08/22/2022 16:05   Result Date: 08/22/2022 CLINICAL DATA:  42 year old female presenting for biopsy of calcifications in the right breast. EXAM: RIGHT BREAST STEREOTACTIC CORE NEEDLE BIOPSY x 2 COMPARISON:  Previous exam(s). FINDINGS: The patient and I discussed the procedure of stereotactic-guided biopsy including benefits and alternatives. We discussed the  high likelihood of a successful procedure. We discussed the risks of the procedure including infection, bleeding, tissue injury, clip migration, and inadequate sampling. Informed written consent was given. The usual time out protocol was performed immediately prior to the procedure. Using sterile technique and 1% Lidocaine as local anesthetic, under stereotactic guidance, a 9 gauge vacuum assisted device was used to perform core needle biopsy of calcifications in the upper central right breast using a lateral approach. Specimen radiograph was performed showing several specimens with calcifications. Specimens with calcifications are identified for pathology. Lesion quadrant: Upper outer quadrant At the conclusion of the procedure, an X shaped tissue marker clip was deployed into the biopsy cavity. Follow-up 2-view mammogram was performed and dictated separately. Using sterile technique and 1% Lidocaine as local anesthetic, under stereotactic guidance, a 9 gauge vacuum assisted device was used to perform core needle biopsy of calcifications in the central inferior right breast using a medial approach. Specimen radiograph was performed showing several specimens with calcifications. Specimens with calcifications are identified for pathology. Lesion quadrant: Lower inner quadrant At the conclusion of the procedure, a coil shaped tissue marker clip was deployed into the biopsy cavity. Follow-up 2-view mammogram was performed and dictated separately. IMPRESSION: Stereotactic-guided biopsy of calcifications in the upper central and central inferior right breast. No apparent complications. Electronically Signed: By: Emmaline Kluver M.D. On: 08/21/2022 12:06  [place] RT BREAST BX W LOC DEV 1ST LESION IMG BX SPEC US GUIDE  Addendum Date: 08/22/2022   ADDENDUM REPORT: 08/22/2022 16:05 ADDENDUM: PATHOLOGY revealed: Site 1. Breast, RIGHT, needle core biopsy, upper central calcifications x clip - BENIGN BREAST TISSUE WITH ADENOSIS  AND CALCIFICATIONS - NO MALIGNANCY IDENTIFIED Pathology results are CONCORDANT with imaging findings, per Dr. Emmaline Kluver. PATHOLOGY revealed: Site 2. Breast, RIGHT, needle core biopsy, lower central calcifications coil clip - BENIGN BREAST TISSUE WITH ADENOSIS AND CALCIFICATIONS - NO MALIGNANCY IDENTIFIED Pathology results are CONCORDANT with imaging findings, per Dr. Emmaline Kluver. PATHOLOGY revealed: Site 3. Breast, RIGHT, needle core biopsy, 2 o'clock venus clip - INVASIVE DUCTAL CARCINOMA, SEE NOTE - TUBULE FORMATION: SCORE 3 - NUCLEAR PLEOMORPHISM: SCORE 3 - MITOTIC COUNT: SCORE 2 - TOTAL SCORE: 8 - OVERALL GRADE: 3 - LYMPHOVASCULAR INVASION: NOT IDENTIFIED CANCER LENGTH: 1.0 CM - CALCIFICATIONS: NOT IDENTIFIED Pathology results are CONCORDANT with imaging findings, per Dr. Emmaline Kluver. Pathology results and recommendations below were discussed with patient by telephone on 08/22/2022. Patient reported biopsy site within normal limits with slight tenderness at the site. Post biopsy care instructions were reviewed, questions were answered and my direct phone number was provided to patient. Patient was instructed to call [facility] if any  concerns or questions arise related to the biopsy. The patient was referred to the [facility] at [facility] with appointment on 09/04/2022. RECOMMENDATION: The patient was recommended to return for a bilateral breast MRI due to far posterior location of malignant mass near the pectoralis muscle. A secure email was sent to [facility] schedulers. Pathology results reported by Lynett Grimes, RN on 08/22/2022. Electronically Signed   By: Emmaline Kluver M.D.   On: 08/22/2022 16:05   Result Date: 08/22/2022 CLINICAL DATA:  42 year old female presenting for biopsy of a mass in the right breast. EXAM: ULTRASOUND GUIDED RIGHT BREAST CORE NEEDLE BIOPSY COMPARISON:  Previous exam(s). PROCEDURE: I met  with the patient and we discussed the procedure of ultrasound-guided biopsy, including benefits and alternatives. We discussed the high likelihood of a successful procedure. We discussed the risks of the procedure, including infection, bleeding, tissue injury, clip migration, and inadequate sampling. Informed written consent was given. The usual time-out protocol was performed immediately prior to the procedure. Lesion quadrant: Upper inner quadrant Using sterile technique and 1% Lidocaine as local anesthetic, under direct ultrasound visualization, a 14 gauge spring-loaded device was used to perform biopsy of a mass in the right breast at 2 o'clock using a medial approach. At the conclusion of the procedure a Venus shaped tissue marker clip was deployed into the biopsy cavity. Follow up 2 view mammogram was performed and dictated separately. IMPRESSION: Ultrasound guided biopsy of a mass in the right breast at 2 o'clock. No apparent complications. Electronically Signed: By: Emmaline Kluver M.D. On: 08/21/2022 12:08  MM CLIP PLACEMENT RIGHT  Result Date: 08/21/2022 CLINICAL DATA:  Post procedure mammogram for clip placement EXAM: 3D DIAGNOSTIC RIGHT MAMMOGRAM POST ULTRASOUND AND STEREOTACTIC BIOPSY COMPARISON:  Previous exam(s). FINDINGS: 3D Mammographic images were obtained following stereotactic guided biopsy of calcifications in the upper central right breast. The X biopsy marking clip is in expected position at the site of biopsy. 3D Mammographic images were obtained following stereotactic guided biopsy of calcifications in the central inferior right breast. The coil biopsy marking clip is in expected position at the site of biopsy. 3D Mammographic images were obtained following ultrasound guided biopsy of a mass in the right breast at 2 o'clock. The Venus biopsy marking clip is in expected position at the site of biopsy. IMPRESSION: Appropriate positioning of the X, coil, and Venus marking clips at the  sites of biopsy in the right breast. Final Assessment: Post Procedure Mammograms for Marker Placement Electronically Signed   By: Emmaline Kluver M.D.   On: 08/21/2022 12:04  MM 3D DIAGNOSTIC MAMMOGRAM BILATERAL BREAST  Result Date: 08/19/2022 CLINICAL DATA:  42 year old female presenting for evaluation of a new lump in the right breast. Patient reports a new lump in the right breast for approximately 3 weeks. Baseline evaluation. EXAM: DIGITAL DIAGNOSTIC BILATERAL MAMMOGRAM WITH TOMOSYNTHESIS; ULTRASOUND RIGHT BREAST LIMITED TECHNIQUE: Bilateral digital diagnostic mammography and breast tomosynthesis was performed.; Targeted ultrasound examination of the right breast was performed COMPARISON:  None available. ACR Breast Density Category b: There are scattered areas of fibroglandular density. FINDINGS: Mammogram: Right breast: A skin BB marks the palpable site of concern reported by the patient in the upper inner right breast. On the spot 2D magnification views performed there is a partially visualized irregular mass at the palpable site measuring at least 2.0 cm. There are diffuse punctate calcifications throughout the right breast which are asymmetrically increased compared to the left side. Left breast: No  suspicious mass or asymmetry. There are diffuse punctate calcifications predominantly in the outer left breast. On physical exam at the site of concern reported by the patient in the upper inner right breast I feel a discrete fixed mass. Ultrasound: Targeted ultrasound is performed at the palpable site of concern in the right breast at 2 o'clock 6 cm from the nipple demonstrating an irregular predominantly hyperechoic mass. The mass has indistinct margins making measurements difficult but measures approximately 3.0 x 1.4 x 2.5 cm. Mass is on top of the pectoralis muscle. Targeted ultrasound of the right axilla demonstrates no abnormal lymph nodes. IMPRESSION: 1. Suspicious mass at the palpable site of  concern in the right breast at 2 o'clock measuring approximately 3.0 cm. Measurements somewhat difficult due to indistinct margins. The mass sits on top of the pectoralis muscle. 2. Indeterminate diffuse calcifications throughout the right breast which are asymmetrically increased compared to the left side. RECOMMENDATION: 1. Ultrasound-guided core needle biopsy x1 targeting the palpable mass in the right breast at 2 o'clock. 2. Stereotactic core needle biopsy x2 of the right breast targeting two sites of calcifications at the discretion of the radiologist performing the biopsy. 3. If the right breast calcifications demonstrate atypia or malignancy recommend biopsy of the left breast calcifications. 4. If the mass in the right breast demonstrates malignancy recommend breast MRI given mass is sitting on the pectoralis muscle. I have discussed the findings and recommendations with the patient. If applicable, a reminder letter will be sent to the patient regarding the next appointment. BI-RADS CATEGORY  4: Suspicious. Electronically Signed   By: Emmaline Kluver M.D.   On: 08/19/2022 09:45  [place] LIMITED ULTRASOUND INCLUDING AXILLA RIGHT BREAST  Result Date: 08/19/2022 CLINICAL DATA:  42 year old female presenting for evaluation of a new lump in the right breast. Patient reports a new lump in the right breast for approximately 3 weeks. Baseline evaluation. EXAM: DIGITAL DIAGNOSTIC BILATERAL MAMMOGRAM WITH TOMOSYNTHESIS; ULTRASOUND RIGHT BREAST LIMITED TECHNIQUE: Bilateral digital diagnostic mammography and breast tomosynthesis was performed.; Targeted ultrasound examination of the right breast was performed COMPARISON:  None available. ACR Breast Density Category b: There are scattered areas of fibroglandular density. FINDINGS: Mammogram: Right breast: A skin BB marks the palpable site of concern reported by the patient in the upper inner right breast. On the spot 2D magnification views performed there is a partially  visualized irregular mass at the palpable site measuring at least 2.0 cm. There are diffuse punctate calcifications throughout the right breast which are asymmetrically increased compared to the left side. Left breast: No suspicious mass or asymmetry. There are diffuse punctate calcifications predominantly in the outer left breast. On physical exam at the site of concern reported by the patient in the upper inner right breast I feel a discrete fixed mass. Ultrasound: Targeted ultrasound is performed at the palpable site of concern in the right breast at 2 o'clock 6 cm from the nipple demonstrating an irregular predominantly hyperechoic mass. The mass has indistinct margins making measurements difficult but measures approximately 3.0 x 1.4 x 2.5 cm. Mass is on top of the pectoralis muscle. Targeted ultrasound of the right axilla demonstrates no abnormal lymph nodes. IMPRESSION: 1. Suspicious mass at the palpable site of concern in the right breast at 2 o'clock measuring approximately 3.0 cm. Measurements somewhat difficult due to indistinct margins. The mass sits on top of the pectoralis muscle. 2. Indeterminate diffuse calcifications throughout the right breast which are asymmetrically increased compared to the left side. RECOMMENDATION: 1.  Ultrasound-guided core needle biopsy x1 targeting the palpable mass in the right breast at 2 o'clock. 2. Stereotactic core needle biopsy x2 of the right breast targeting two sites of calcifications at the discretion of the radiologist performing the biopsy. 3. If the right breast calcifications demonstrate atypia or malignancy recommend biopsy of the left breast calcifications. 4. If the mass in the right breast demonstrates malignancy recommend breast MRI given mass is sitting on the pectoralis muscle. I have discussed the findings and recommendations with the patient. If applicable, a reminder letter will be sent to the patient regarding the next appointment. BI-RADS CATEGORY   4: Suspicious. Electronically Signed   By: Emmaline Kluver M.D.   On: 08/19/2022 09:45      IMPRESSION/PLAN: 1. Stage IIA, cT2N0M0, grade 3, ER/PR positive invasive ductal carcinoma of the right breast. Dr. Mitzi Hansen discusses the pathology findings and reviews the nature of *** breast disease.  We discussed following final pathologic assessment, her tumor will likely be tested for Oncotype Dx score to determine a role for systemic therapy.  While the patient is planning on mastectomy, the size of her tumor, and proximity to the pectoralis may pose a role for external radiotherapy to the chest wall and regional lymph nodes to reduce risks of local recurrence followed by antiestrogen therapy. We discussed the risks, benefits, short, and long term effects of radiotherapy, as well as the curative intent,  delivery and logistics of radiotherapy, and the discussion of final pathology. The final decision for treatment with radiation.  If she were to proceed with postmastectomy radiation, we discussed the implications of reconstruction, the timing of such and that if we were to proceed, Dr. Mitzi Hansen anticipates a course of 6 1/2  weeks of radiotherapy. We will see her back a few weeks after surgery to discuss the simulation process and anticipate we starting radiotherapy about 4-6 weeks after surgery.  2. Possible genetic predisposition to malignancy. The patient is a candidate for genetic testing given her personal and family history.  She will be proceeding with this testing in the near future. 3. Contraceptive counseling. The patient is aware of the need to avoid pregnancy during radiation if we were to proceed with this treatment.  We will revisit this as appropriate   In a visit lasting *** minutes, greater than 50% of the time was spent face to face reviewing her case, as well as in preparation of, discussing, and coordinating the patient's care.  The above documentation reflects my direct findings during this  shared patient visit. Please see the separate note by Dr. Mitzi Hansen on this date for the remainder of the patient's plan of care.    Osker Mason, [facility]    **Disclaimer: This note was dictated with voice recognition software. Similar sounding words can inadvertently be transcribed and this note may contain transcription errors which may not have been corrected upon publication of note.**

## 2022-09-02 ENCOUNTER — Other Ambulatory Visit: Payer: Self-pay | Admitting: *Deleted

## 2022-09-02 ENCOUNTER — Ambulatory Visit
Admission: RE | Admit: 2022-09-02 | Discharge: 2022-09-02 | Disposition: A | Discharge status: Home / Self Care | Payer: Self-pay | Source: Ambulatory Visit | Attending: Obstetrics and Gynecology | Admitting: Obstetrics and Gynecology

## 2022-09-02 DIAGNOSIS — C50211 Malignant neoplasm of upper-inner quadrant of right female breast: Secondary | ICD-10-CM

## 2022-09-02 DIAGNOSIS — N63 Unspecified lump in unspecified breast: Secondary | ICD-10-CM

## 2022-09-02 MED ORDER — GADOPICLENOL 0.5 MMOL/ML IV SOLN
8.0000 mL | Freq: Once | INTRAVENOUS | Status: AC | PRN
  Administered 2022-09-02: 8 mL via INTRAVENOUS

## 2022-09-02 NOTE — Progress Notes (Signed)
New Breast Cancer Diagnosis: Right Breast UIQ  Did patient present with symptoms (if so, please note symptoms) or screening mammography?: Palpable mass    Location and Extent of disease :right breast. Located at 2 o'clock position, measured 3 cm in greatest dimension. Adenopathy no.  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 Corliss Skains -Right Mastectomy with SLN biopsy 09/17/2022   Medical oncologist, treatment if any:   Dr. Myna Hidalgo 09/04/2022   Family History of Breast/Ovarian/Prostate Cancer: Mother had breast cancer.  Lymphedema issues, if any: She reports a little puffiness/swelling at the biopsy site.     Pain issues, if any: She reports some tenderness at the biopsy site.    SAFETY ISSUES: Prior radiation? No Pacemaker/ICD? No Possible current pregnancy? Having Cycles, No birth control Is the patient on methotrexate?   Current Complaints / other details:   Genetics 09/05/2022:   Plastic Surgery 09/06/2022- Dr. Leta Baptist

## 2022-09-03 ENCOUNTER — Encounter: Payer: Self-pay | Admitting: Radiation Oncology

## 2022-09-03 ENCOUNTER — Ambulatory Visit
Admission: RE | Admit: 2022-09-03 | Discharge: 2022-09-03 | Disposition: A | Discharge status: Home / Self Care | Payer: Self-pay | Source: Ambulatory Visit | Attending: Radiation Oncology | Admitting: Radiation Oncology

## 2022-09-03 VITALS — Ht 67.0 in

## 2022-09-03 DIAGNOSIS — Z170 Estrogen receptor positive status [ER+]: Secondary | ICD-10-CM | POA: Insufficient documentation

## 2022-09-03 DIAGNOSIS — Z803 Family history of malignant neoplasm of breast: Secondary | ICD-10-CM | POA: Insufficient documentation

## 2022-09-03 DIAGNOSIS — C50211 Malignant neoplasm of upper-inner quadrant of right female breast: Secondary | ICD-10-CM

## 2022-09-03 DIAGNOSIS — Z87891 Personal history of nicotine dependence: Secondary | ICD-10-CM | POA: Insufficient documentation

## 2022-09-04 ENCOUNTER — Encounter: Payer: Self-pay | Admitting: *Deleted

## 2022-09-04 ENCOUNTER — Inpatient Hospital Stay: Payer: Self-pay | Admitting: Licensed Clinical Social Worker

## 2022-09-04 ENCOUNTER — Inpatient Hospital Stay: Payer: Self-pay | Attending: Hematology & Oncology

## 2022-09-04 ENCOUNTER — Other Ambulatory Visit: Payer: Self-pay

## 2022-09-04 ENCOUNTER — Inpatient Hospital Stay (HOSPITAL_BASED_OUTPATIENT_CLINIC_OR_DEPARTMENT_OTHER): Payer: Self-pay | Admitting: Hematology & Oncology

## 2022-09-04 ENCOUNTER — Encounter: Payer: Self-pay | Admitting: Hematology & Oncology

## 2022-09-04 VITALS — BP 121/69 | HR 76 | Temp 98.0°F | Resp 16 | Ht 67.0 in | Wt 145.0 lb

## 2022-09-04 DIAGNOSIS — Z803 Family history of malignant neoplasm of breast: Secondary | ICD-10-CM | POA: Insufficient documentation

## 2022-09-04 DIAGNOSIS — C50211 Malignant neoplasm of upper-inner quadrant of right female breast: Secondary | ICD-10-CM

## 2022-09-04 LAB — CBC WITH DIFFERENTIAL (CANCER CENTER ONLY)
Abs Immature Granulocytes: 0.02 10*3/uL (ref 0.00–0.07)
Basophils Absolute: 0 10*3/uL (ref 0.0–0.1)
Basophils Relative: 1 %
Eosinophils Absolute: 0.2 10*3/uL (ref 0.0–0.5)
Eosinophils Relative: 3 %
HCT: 38.9 % (ref 36.0–46.0)
Hemoglobin: 12.8 g/dL (ref 12.0–15.0)
Immature Granulocytes: 0 %
Lymphocytes Relative: 22 %
Lymphs Abs: 1.5 10*3/uL (ref 0.7–4.0)
MCH: 29.3 pg (ref 26.0–34.0)
MCHC: 32.9 g/dL (ref 30.0–36.0)
MCV: 89 fL (ref 80.0–100.0)
Monocytes Absolute: 0.6 10*3/uL (ref 0.1–1.0)
Monocytes Relative: 9 %
Neutro Abs: 4.6 10*3/uL (ref 1.7–7.7)
Neutrophils Relative %: 65 %
Platelet Count: 216 10*3/uL (ref 150–400)
RBC: 4.37 MIL/uL (ref 3.87–5.11)
RDW: 13.3 % (ref 11.5–15.5)
WBC Count: 6.9 10*3/uL (ref 4.0–10.5)
nRBC: 0 % (ref 0.0–0.2)

## 2022-09-04 LAB — CMP (CANCER CENTER ONLY)
ALT: 8 U/L (ref 0–44)
AST: 10 U/L — ABNORMAL LOW (ref 15–41)
Albumin: 4.4 g/dL (ref 3.5–5.0)
Alkaline Phosphatase: 28 U/L — ABNORMAL LOW (ref 38–126)
Anion gap: 6 (ref 5–15)
BUN: 20 mg/dL (ref 6–20)
CO2: 29 mmol/L (ref 22–32)
Calcium: 9.1 mg/dL (ref 8.9–10.3)
Chloride: 105 mmol/L (ref 98–111)
Creatinine: 0.72 mg/dL (ref 0.44–1.00)
GFR, Estimated: >60 mL/min (ref >60)
Glucose, Bld: 97 mg/dL (ref 70–99)
Potassium: 4 mmol/L (ref 3.5–5.1)
Sodium: 140 mmol/L (ref 135–145)
Total Bilirubin: 0.4 mg/dL (ref 0.3–1.2)
Total Protein: 6.5 g/dL (ref 6.5–8.1)

## 2022-09-04 NOTE — Progress Notes (Signed)
Referral MD  Reason for Referral: Invasive ductal carcinoma of the right breast  Chief Complaint  Patient presents with   New Patient (Initial Visit)  : I have breast cancer.  HPI: Rachel Singleton is a very charming 42 year old premenopausal white female.  She actually was in my wife's BSF class.  She has been very healthy.  She has 5 children.  She comes in with her husband who is an Network engineer.  She had been in great health.  She exercises.  She found a mass in the right breast.  She just felt this at nighttime.  This was in the right upper inner quadrant.  There was no previous mammograms that she had.  She subsequently underwent a diagnostic mammogram and ultrasound.  This was done on 08/19/2022.  This showed a 3 cm mass at the 2 o'clock position in the right breast.  There is some suspicious calcifications elsewhere.  She then underwent a biopsy.  This was done on 08/21/2022.  The pathology report [id number]) showed an invasive ductal carcinoma at the 2 o'clock position.  The other biopsies were negative.  The tumor was fairly high-grade with a score of 8.  There is no lymphovascular invasion.  The biopsy measured 1 cm.  The tumor prognostic markers showed ER positive/PR positive/HER2 negative.  She then underwent bilateral breast MRI.  This was on 09/02/2022.  This did confirm a mass in the upper inner posterior right breast measuring 1.6 x 1.8 x 1.9 cm.  The mass abuts the anterior margin of the pectoralis muscle.  There is no obvious abnormal lymph nodes.  Left breast was unremarkable.  She has seen Dr. Marcille Blanco of Surgical Oncology.  He had a long talk with her.  She is elected to go with bilateral mastectomies.  She has a family history of breast cancer.  Her mother had breast cancer in her 76s.  I think there is a aunt and and some cousins who have had breast cancer.  She actually is going to undergo genetic counseling and testing tomorrow.  She had her first  child when she was 81 years old.  She has not been on any estrogens.  She does not smoke.  She does not drink.  She has a very healthy diet.  Overall, I would say performance status is probably ECOG 0.   Past Medical History:  Diagnosis Date   Asthma    pregnancy only   Breast cancer (HCC) 08/21/2022   Former smoker 03/18/2006   Quit 2008   Hx of varicella   :   Past Surgical History:  Procedure Laterality Date   BREAST BIOPSY Right 08/21/2022   MM RT BREAST BX W LOC DEV 1ST LESION IMAGE BX SPEC STEREO GUIDE 08/21/2022 [facility]   BREAST BIOPSY Right 08/21/2022   MM RT BREAST BX W LOC DEV EA AD LESION IMG BX SPEC STEREO GUIDE 08/21/2022 [facility]   BREAST BIOPSY Right 08/21/2022   [place] RT BREAST BX W LOC DEV 1ST LESION IMG BX SPEC US GUIDE 08/21/2022 [facility]   NO PAST SURGERIES    :  No current outpatient medications on file.:  :   Allergies  Allergen Reactions   Aluminum-Containing Compounds Hives and Rash  :   Family History  Problem Relation Age of Onset   Breast cancer Mother 76   Heart disease Father    Heart attack Father    Cystic fibrosis Brother   :   Social History  Socioeconomic History   Marital status: Married    Spouse name: Not on file   Number of children: Not on file   Years of education: Not on file   Highest education level: Not on file  Occupational History   Not on file  Tobacco Use   Smoking status: Never   Smokeless tobacco: Never  Substance and Sexual Activity   Alcohol use: No   Drug use: No   Sexual activity: Yes    Birth control/protection: None  Other Topics Concern   Not on file  Social History Narrative   Not on file   Social Determinants of Health   Financial Resource Strain: Not on file  Food Insecurity: No Food Insecurity (09/04/2022)   Hunger Vital Sign    Worried About Running Out of Food in the Last Year: Never true    Ran Out of Food in the Last Year: Never true  Transportation Needs:  No Transportation Needs (09/04/2022)   PRAPARE - Administrator, Civil Service (Medical): No    Lack of Transportation (Non-Medical): No  Physical Activity: Not on file  Stress: Not on file  Social Connections: Not on file  Intimate Partner Violence: Not At Risk (09/04/2022)   Humiliation, Afraid, Rape, and Kick questionnaire    Fear of Current or Ex-Partner: No    Emotionally Abused: No    Physically Abused: No    Sexually Abused: No  : Review of Systems  Constitutional: Negative.   HENT: Negative.    Eyes: Negative.   Respiratory: Negative.    Cardiovascular: Negative.   Gastrointestinal: Negative.   Genitourinary: Negative.   Skin: Negative.   Neurological: Negative.   Endo/Heme/Allergies: Negative.   Psychiatric/Behavioral: Negative.       Exam: Vital signs show temperature of 98.  Pulse 76.  Blood pressure 121/69.  Weight is 145 pounds.  @IPVITALS @ Physical Exam Vitals reviewed.  Constitutional:      Comments: On her breast exam, left breast is without masses, edema or erythema.  There is no left axillary adenopathy.  Right breast shows some ecchymoses at her biopsies.  She does not have any obvious palpable mass I can feel.  There is no right nipple discharge.  There is no right axillary adenopathy.  HENT:     Head: Normocephalic and atraumatic.  Eyes:     Pupils: Pupils are equal, round, and reactive to light.  Cardiovascular:     Rate and Rhythm: Normal rate and regular rhythm.     Heart sounds: Normal heart sounds.  Pulmonary:     Effort: Pulmonary effort is normal.     Breath sounds: Normal breath sounds.  Abdominal:     General: Bowel sounds are normal.     Palpations: Abdomen is soft.  Musculoskeletal:        General: No tenderness or deformity. Normal range of motion.     Cervical back: Normal range of motion.  Lymphadenopathy:     Cervical: No cervical adenopathy.  Skin:    General: Skin is warm and dry.     Findings: No erythema or rash.   Neurological:     Mental Status: She is alert and oriented to person, place, and time.  Psychiatric:        Behavior: Behavior normal.        Thought Content: Thought content normal.        Judgment: Judgment normal.     Recent Labs    09/04/22 1052  WBC 6.9  HGB 12.8  HCT 38.9  PLT 216    Recent Labs    09/04/22 1052  NA 140  K 4.0  CL 105  CO2 29  GLUCOSE 97  BUN 20  CREATININE 0.72  CALCIUM 9.1    Blood smear review:   Pathology: See above    Assessment and Plan: Rachel Singleton is a very charming 42 year old premenopausal white female.  She has what appears to be a stage I invasive ductal carcinoma of the right breast.  This does have a fairly high nuclear score.  Of note, the proliferation marker- Ki67 - is 25%.  At this point, I think we will have to see what is found at the time of surgery.  I think the key is going to be the Oncotype score.  I would like to think that she will have a lower Oncotype score that we suggest that chemotherapy is not going to be effective.  She clearly will need antiestrogen therapy.  I would think that with mastectomy, she will not need radiation therapy unless she has positive axillary lymph nodes.  I have a sense that she may not be all that interested in antiestrogen therapy with tamoxifen.  I know that she has tried to go with a very holistic approach.  Again, I really think the key is going to be the Oncotype score with her.  She has a somewhat higher proliferation marker than I would like.  I would have preferred to have the Ki67 below 20%.  She is going to have surgery on July 2.  We will plan to get her back to see [place] once we have all the results back from the pathology and the Oncotype score.  We will then make further recommendations.  It was incredibly fun talking to her she and her husband.  They have an incredible faith.  I gave her a prayer blanket which she very much enjoyed.

## 2022-09-04 NOTE — Progress Notes (Signed)
[facility] Clinical Social Work  Initial Assessment   Rachel Singleton is a 42 y.o. year old female accompanied by spouse. Clinical Social Work was referred by nurse navigator for assessment of psychosocial needs.   SDOH (Social Determinants of Health) assessments performed: Yes SDOH Interventions    Flowsheet Row Office Visit from 09/04/2022 in [facility]  SDOH Interventions   Food Insecurity Interventions Intervention Not Indicated  Housing Interventions Intervention Not Indicated  Transportation Interventions Intervention Not Indicated  Utilities Interventions Intervention Not Indicated       SDOH Screenings   Food Insecurity: No Food Insecurity (09/04/2022)  Housing: Low Risk  (09/04/2022)  Transportation Needs: No Transportation Needs (09/04/2022)  Utilities: Not At Risk (09/04/2022)  Depression (PHQ2-9): Low Risk  (09/04/2022)  Tobacco Use: Low Risk  (09/04/2022)     Distress Screen completed: No     No data to display            Family/Social Information:  Housing Arrangement: patient lives with her husband, Rachel Singleton, and their five children.   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  The youngest child is 34 years old. Family members/support persons in your life? Family Transportation concerns: no  Employment: Unemployed.  Patient was previously a Leisure centre manager.  Income source: Supported by [employer] and [employer] Financial concerns: No Type of concern: None Food access concerns: no Religious or spiritual practice: Yes-Patient identifies as [employer] Currently in place:  None  Coping/ Adjustment to diagnosis: Patient understands treatment plan and what happens next? Patient is going to have a mastectomy. Concerns about diagnosis and/or treatment:  Patient did not express any concerns during this visit. Patient reported stressors:  Denied any stressors. Hopes and/or priorities: Family Patient enjoys time with family/ friends Current coping skills/  strengths: Capable of independent living , Manufacturing systems engineer , Contractor , General fund of knowledge , and Supportive family/friends     SUMMARY: Current SDOH Barriers:  None per patient.  Clinical Social Work Clinical Goal(s):  No clinical social work goals at this time  Interventions: Discussed common feeling and emotions when being diagnosed with cancer, and the importance of support during treatment Informed patient of the support team roles and support services at [facility] Provided CSW contact information and encouraged patient to call with any questions or concerns Provided patient with information about the [employer] and provided brochure and CSW card.   Offered patient resources for her children regarding cancer.   Follow Up Plan: Patient will contact CSW with any support or resource needs Patient verbalizes understanding of plan: Yes    Dorothey Baseman, LCSW Clinical Social Worker [facility]

## 2022-09-04 NOTE — Progress Notes (Signed)
Initial RN Navigator Patient Visit  Name: Rachel Singleton Date of Referral : 08/23/2022 Diagnosis: IDC ER/PR + HER2-  Met with patient prior to their visit with MD. Jovita Gamma patient "Your Patient Navigator" handout which explains my role, areas in which I am able to help, and all the contact information for myself and the office. Also gave patient MD and Navigator business card. Reviewed with patient the general overview of expected course after initial diagnosis and time frame for all steps to be completed.  New patient packet given to patient which includes: orientation to office and staff; campus directory; education on My Chart and Advance Directives; and patient centered education on breast cancer.   Patient doesn't work outside of the home. She lives with her husband and 5 children (ages 63-14). Her husband is self employed and has a flexible schedule. She states she has a large support system.   She is scheduled for a R mastectomy with SLNB on 09/17/2022. Already scheduled with genetics.   Patient completed visit with Dr. Myna Hidalgo.  Patient understands all follow up procedures and expectations. They have my number to reach out for any further clarification or additional needs.  Oncology Nurse Navigator Documentation     09/04/2022   11:00 AM  Oncology Nurse Navigator Flowsheets  Navigator Follow Up Date: 09/17/2022  Navigator Follow Up Reason: Surgery  Navigator Location [facility]  Navigator Encounter Type Initial MedOnc  Patient Visit Type MedOnc  Treatment Phase Pre-Tx/Tx Discussion  Barriers/Navigation Needs Coordination of Care;Education  Education Newly Diagnosed Cancer Education;Pain/ Symptom Management  Interventions Education;Psycho-Social Support;Referrals  Acuity Level 2-Minimal Needs (1-2 Barriers Identified)  Referrals Nutrition/dietician;Social Work  Nature conservation officer Groups/Services Friends and Family  Time Spent with Patient 30

## 2022-09-05 ENCOUNTER — Ambulatory Visit: Payer: Self-pay | Attending: Surgery | Admitting: Rehabilitation

## 2022-09-05 ENCOUNTER — Encounter: Payer: Self-pay | Admitting: Rehabilitation

## 2022-09-05 ENCOUNTER — Other Ambulatory Visit: Payer: Self-pay | Admitting: Genetic Counselor

## 2022-09-05 ENCOUNTER — Inpatient Hospital Stay: Payer: Self-pay

## 2022-09-05 ENCOUNTER — Inpatient Hospital Stay (HOSPITAL_BASED_OUTPATIENT_CLINIC_OR_DEPARTMENT_OTHER): Payer: Self-pay | Admitting: Genetic Counselor

## 2022-09-05 ENCOUNTER — Other Ambulatory Visit: Payer: Self-pay

## 2022-09-05 ENCOUNTER — Encounter: Payer: Self-pay | Admitting: Genetic Counselor

## 2022-09-05 DIAGNOSIS — C50211 Malignant neoplasm of upper-inner quadrant of right female breast: Secondary | ICD-10-CM | POA: Insufficient documentation

## 2022-09-05 DIAGNOSIS — Z1379 Encounter for other screening for genetic and chromosomal anomalies: Secondary | ICD-10-CM

## 2022-09-05 DIAGNOSIS — Z800 Family history of malignant neoplasm of digestive organs: Secondary | ICD-10-CM

## 2022-09-05 DIAGNOSIS — R293 Abnormal posture: Secondary | ICD-10-CM | POA: Insufficient documentation

## 2022-09-05 DIAGNOSIS — Z803 Family history of malignant neoplasm of breast: Secondary | ICD-10-CM

## 2022-09-05 LAB — GENETIC SCREENING ORDER

## 2022-09-05 NOTE — Therapy (Signed)
OUTPATIENT PHYSICAL THERAPY BREAST CANCER BASELINE EVALUATION   Patient Name: Rachel Singleton MRN: [id number] DOB:1980-06-04, 42 y.o., female Today's Date: 09/05/2022  END OF SESSION:  PT End of Session - 09/05/22 1222     Visit Number 1    Number of Visits 2    Date for PT Re-Evaluation 10/17/22    PT Start Time 1200    PT Stop Time 1222    PT Time Calculation (min) 22 min    Activity Tolerance Patient tolerated treatment well    Behavior During Therapy [facility] for tasks assessed/performed             Past Medical History:  Diagnosis Date   Asthma    pregnancy only   Breast cancer (HCC) 08/21/2022   Former smoker 03/18/2006   Quit 2008   Hx of varicella    Past Surgical History:  Procedure Laterality Date   BREAST BIOPSY Right 08/21/2022   MM RT BREAST BX W LOC DEV 1ST LESION IMAGE BX SPEC STEREO GUIDE 08/21/2022 [facility]   BREAST BIOPSY Right 08/21/2022   MM RT BREAST BX W LOC DEV EA AD LESION IMG BX SPEC STEREO GUIDE 08/21/2022 [facility]   BREAST BIOPSY Right 08/21/2022   [place] RT BREAST BX W LOC DEV 1ST LESION IMG BX SPEC US GUIDE 08/21/2022 [facility]   NO PAST SURGERIES     Patient Active Problem List   Diagnosis Date Noted   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 Waynard Reeds  REFERRING PROVIDER: Dr. 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   PERTINENT HISTORY:  Patient was diagnosed on 09/01/22 with IDC. It is located in the upper inner quadrant. It is ER/PR positive. Will be having bil mastectomy on 09/17/22 with expander.   PATIENT GOALS:   reduce  lymphedema risk and learn post op HEP.   PAIN:  Are you having pain? No  PRECAUTIONS: Active CA   HAND DOMINANCE: right  WEIGHT BEARING RESTRICTIONS: No  FALLS:  Has patient fallen in last 6 months? 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, home schools  LEISURE: Weight lifting: videos at home heaviest is 20#   PRIOR LEVEL OF FUNCTION: Independent   OBJECTIVE:  COGNITION: Overall cognitive status: Within functional limits for tasks assessed    POSTURE:  Forward head and rounded shoulders posture  UPPER EXTREMITY AROM/PROM:  A/PROM RIGHT   eval   Shoulder extension 65  Shoulder flexion 155  Shoulder abduction 165  Shoulder internal rotation   Shoulder external rotation 90    (Blank rows = not tested)  A/PROM LEFT   eval  Shoulder extension 50  Shoulder flexion 155  Shoulder abduction 165  Shoulder internal rotation   Shoulder external rotation 81    (Blank rows = not tested)  UPPER EXTREMITY STRENGTH: 5/5  LYMPHEDEMA ASSESSMENTS:   LANDMARK RIGHT   eval  10 cm proximal to olecranon process 28.5  Olecranon process 24.8  10 cm proximal to ulnar styloid process 20.2  Just proximal to ulnar styloid process 15.2  Across hand at thumb web space 19.8  At base of 2nd digit 5.9  (Blank rows = not tested)  LANDMARK LEFT   eval  10 cm proximal to olecranon process 28.3  Olecranon process 24.3  10 cm proximal to ulnar styloid process 20.2  Just proximal to ulnar styloid process 15.1  Across hand at  thumb web space 19.0  At base of 2nd digit 5.9  (Blank rows = not tested)  L-DEX LYMPHEDEMA SCREENING: The patient was assessed using the L-Dex machine today to produce a lymphedema index baseline score. The patient will be reassessed on a regular basis (typically every 3 months) to obtain new L-Dex scores. If the score is > 6.5 points away from his/her baseline score indicating onset of subclinical lymphedema, it will be recommended to wear a compression garment for 4 weeks, 12 hours per day and then be reassessed. If the score continues to be > 6.5 points from baseline at reassessment, we will initiate lymphedema treatment. Assessing in this manner has a 95% rate of preventing clinically significant lymphedema.  QUICK DASH SURVEY: 0%  PATIENT EDUCATION:  Education details: Lymphedema risk reduction and post op shoulder/posture HEP Person educated: Patient Education method: Explanation, Demonstration, Handout Education comprehension: Patient verbalized understanding and returned demonstration  HOME EXERCISE PROGRAM: Patient was instructed today in a home exercise program today for post op shoulder range of motion. These included active assist shoulder flexion in sitting, scapular retraction, wall walking with shoulder abduction, and hands behind head external rotation.  She was encouraged to do these twice a day, holding 3 seconds and repeating 5 times when permitted by her physician.   ASSESSMENT:  CLINICAL IMPRESSION: Pt will benefit from a post op PT reassessment to determine needs and from L-Dex screens every 3 months for 2 years to detect subclinical lymphedema.  Pt will benefit from skilled therapeutic intervention to improve on the following deficits: Decreased knowledge of precautions, impaired UE functional use, pain, decreased ROM, postural dysfunction.   PT treatment/interventions: ADL/self-care home management, pt/family education, therapeutic exercise  REHAB POTENTIAL:  Excellent  CLINICAL DECISION MAKING: Stable/uncomplicated  EVALUATION COMPLEXITY: Low   GOALS: Goals reviewed with patient? YES  LONG TERM GOALS: (STG=LTG)  Name Target Date Goal status  1 Pt will be able to verbalize understanding of pertinent lymphedema risk reduction practices relevant to her dx specifically related to skin care.  Baseline:  No knowledge 09/05/2022 Achieved at eval  2 Pt will be able to return demo and/or verbalize understanding of the post op HEP related to regaining shoulder ROM. Baseline:  No knowledge 09/05/2022 Achieved at eval  3 Pt will be able to verbalize understanding of the importance of attending the post op After Breast CA Class for further lymphedema risk reduction education and therapeutic exercise.  Baseline:  No knowledge 09/05/2022 Achieved at eval  4 Pt will demo she has regained full shoulder ROM and function post operatively compared to baselines.  Baseline: See objective measurements taken today. 10/17/22     PLAN:  PT FREQUENCY/DURATION: EVAL and 1 follow up appointment.   PLAN FOR NEXT SESSION: will reassess 3-4 weeks post op to determine needs.   Patient will follow up at outpatient cancer rehab 3-4 weeks following surgery.  If the patient requires physical therapy at that time, a specific plan will be dictated and sent to the referring physician for approval. The patient was educated today on appropriate basic range of motion exercises to begin post operatively and the importance of attending the After Breast Cancer class following surgery.  Patient was educated today on lymphedema risk reduction practices as it pertains to recommendations that will benefit the patient immediately following surgery.  She verbalized good understanding.    Physical Therapy Information for After Breast Cancer Surgery/Treatment:  Lymphedema is a swelling condition that you may be at risk for in your arm if you have lymph nodes removed from the armpit area.   After a sentinel node biopsy, the risk is approximately 5-9% and is higher after an axillary node dissection.  There is treatment available for this condition and it is not life-threatening.  Contact your physician or physical therapist with concerns. You may begin the 4 shoulder/posture exercises (see additional sheet) when permitted by your physician (typically a week after surgery).  If you have drains, you may need to wait until those are removed before beginning range of motion exercises.  A general recommendation is to not lift your arms above shoulder height until drains are removed.  These exercises should be done to your tolerance and gently.  This is not a "no pain/no gain" type of recovery so listen to your body and stretch into the range of motion that you can tolerate, stopping if you have pain.  If you are having immediate reconstruction, ask your plastic surgeon about doing exercises as he or she may want you to wait. We encourage you to attend the free one time ABC (After Breast Cancer) class offered by [facility].  You will learn information related to lymphedema risk, prevention and treatment and additional exercises to regain mobility following surgery.  You can call [phone] for more information.  This is offered the 1st and 3rd Monday of each month.  You only attend the class one time. While undergoing any medical procedure or treatment, try to avoid blood pressure being taken or needle sticks from occurring on the arm on the side of cancer.   This recommendation begins after surgery and continues for the rest of your life.  This may help reduce your risk of getting lymphedema (swelling in your arm). An excellent resource for those seeking information on lymphedema is the National Lymphedema Network's web site. It  can be accessed at www.lymphnet.org If you notice swelling in your hand, arm or breast at any time following surgery (even if it is many years from  now), please contact your doctor or physical therapist to discuss this.  Lymphedema can be treated at any time but it is easier for you if it is treated early on.  If you feel like your shoulder motion is not returning to normal in a reasonable amount of time, please contact your surgeon or physical therapist.  [facility] [phone]. [address], [place] [address]  ABC CLASS After Breast Cancer Class  After Breast Cancer Class is a specially designed exercise class to assist you in a safe recover after having breast cancer surgery.  In this class you will learn how to get back to full function whether your drains were just removed or if you had surgery a month ago.  This one-time class is held the 1st and 3rd Monday of every month from 11:00 a.m. until 12:00 noon virtually.  This class is FREE and space is limited. For more information or to register for the next available class, call [phone].  Class Goals  Understand specific stretches to improve the flexibility of you chest and shoulder. Learn ways to safely strengthen your upper body and improve your posture. Understand the warning signs of infection and why you may be at risk for an arm infection. Learn about Lymphedema and prevention.  ** You do not attend this class until after surgery.  Drains must be removed to participate  Patient was instructed today in a home exercise program today for post op shoulder range of motion. These included active assist shoulder flexion in sitting, scapular retraction, wall walking with shoulder abduction, and hands behind head external rotation.  She was encouraged to do these twice a day, holding 3 seconds and repeating 5 times when permitted by her physician.    Idamae Lusher, PT 09/05/2022, 12:23 PM

## 2022-09-05 NOTE — Progress Notes (Signed)
REFERRING PROVIDER: Arlan Organ, MD  PRIMARY PROVIDER:  Waynard Reeds, MD  PRIMARY REASON FOR VISIT:  1. 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 Family history of breast cancer   3. Family history of colon cancer    HISTORY OF PRESENT ILLNESS:   Ms. Bitz, a 42 y.o. female, was seen for a [facility] cancer genetics consultation at the request of Dr. Myna Hidalgo due to a personal and family history of cancer.  Ms. Zimmerly presents to clinic today to discuss the possibility of a hereditary predisposition to cancer, to discuss genetic testing, and to further clarify her future cancer risks, as well as potential cancer risks for family members.   In June 2024, at the age of 64, Ms. 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 The treatment plan includes a bilateral mastectomy.   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  First live birth at age 89.  OCP use for approximately 6-7 years.  Ovaries intact: yes.  Uterus intact: yes.  Menopausal status: premenopausal.  HRT use: 0 years. Colonoscopy: no Mammogram within the last year: yes. Up to date with pelvic exams: no Any excessive radiation exposure in the past: no  Past Medical History:  Diagnosis Date   Asthma    pregnancy only   Breast cancer (HCC) 08/21/2022   Former smoker 03/18/2006   Quit 2008    Hx of varicella     Past Surgical History:  Procedure Laterality Date   BREAST BIOPSY Right 08/21/2022   MM RT BREAST BX W LOC DEV 1ST LESION IMAGE BX SPEC STEREO GUIDE 08/21/2022 [facility]   BREAST BIOPSY Right 08/21/2022   MM RT BREAST BX W LOC DEV EA AD LESION IMG BX SPEC STEREO GUIDE 08/21/2022 [facility]   BREAST BIOPSY Right 08/21/2022   [place] RT BREAST BX W LOC DEV 1ST LESION IMG BX SPEC US GUIDE 08/21/2022 [facility]   NO PAST SURGERIES      Social History   Socioeconomic History   Marital status: Married    Spouse name: Not on file   Number of children: Not on file   Years of education: Not on file   Highest education level: Not on file  Occupational History   Not on file  Tobacco Use   Smoking status: Never   Smokeless tobacco: Never  Substance and Sexual Activity   Alcohol use: No   Drug use: No   Sexual activity: Yes    Birth control/protection: None  Other Topics Concern   Not on file  Social History Narrative   Not on file   Social Determinants of Health   Financial Resource Strain: Not on file  Food Insecurity: No Food Insecurity (09/04/2022)   Hunger Vital Sign    Worried About Running Out of Food in the Last Year: Never true    Ran Out of Food in  the Last Year: Never true  Transportation Needs: No Transportation Needs (09/04/2022)   PRAPARE - Administrator, Civil Service (Medical): No    Lack of Transportation (Non-Medical): No  Physical Activity: Not on file  Stress: Not on file  Social Connections: Not on file     FAMILY HISTORY:  We obtained a detailed, 4-generation family history.  Significant diagnoses are listed below: Family History  Problem Relation Age of Onset   Breast cancer Mother 43       DCIS (-/-)   Heart disease Father    Heart attack Father    Cystic fibrosis Brother    Skin cancer Maternal Aunt 93 - 69   Breast cancer Paternal Aunt 33 - 69   Colon cancer Maternal Grandfather 59 - 75   Breast cancer  Other 47 - 50      Ms. Agan's mother was diagnosed with DCIS (ER/PR negative) at age 65, she had a lumpectomy and radiation as her treatment. She has 3 maternal aunts and 1 was diagnosed with skin cancer in her 26s. Her maternal grandfather was diagnosed with colon cancer in his 8s, he died at age 68. She has 2 maternal great aunts who were diagnosed with breast cancer in their 29s, both are deceased. Ms. Prorok father was diagnosed with skin cancer (unknown type) in his mid 61s. She has 2 paternal aunts and 1 was diagnosed with breast cancer in her 38s, she died at age 36. One paternal first cousin once removed was diagnosed with breast cancer in her 71s twice. Ms. Nichol is unaware of previous family history of genetic testing for hereditary cancer risks. There is no reported Ashkenazi Jewish ancestry. There is no known consanguinity.  GENETIC COUNSELING ASSESSMENT: Ms. Gwiazdowski is a 42 y.o. female with a personal and family history of cancer which is somewhat suggestive of a hereditary predisposition to cancer given her young age at diagnosis (<50). We, therefore, discussed and recommended the following at today's visit.   DISCUSSION: We discussed that 5 - 10% of cancer is hereditary, with most cases of breast cancer associated with BRCA1/2. There are other genes that can be associated with hereditary breast cancer syndromes.  We discussed that testing is beneficial for several reasons including knowing how to follow individuals after completing their treatment, identifying whether potential treatment options would be beneficial, and understanding if other family members could be at risk for cancer and allowing them to undergo genetic testing.   We reviewed the characteristics, features and inheritance patterns of hereditary cancer syndromes. We also discussed genetic testing, including the appropriate family members to test, the process of testing, insurance coverage and turn-around-time for  results. We discussed the implications of a negative, positive, carrier and/or variant of uncertain significant result. We recommended Ms. Elliff pursue genetic testing for a panel that includes genes associated with breast, colon, and skin cancer.   Ms. Schwinghammer was offered a common hereditary cancer panel (48 genes) and an expanded pan-cancer panel (71 genes). Ms. Wedlake was informed of the benefits and limitations of each panel, including that expanded pan-cancer panels contain genes that do not have clear management guidelines at this point in time.  We also discussed that as the number of genes included on a panel increases, the chances of variants of uncertain significance increases. After considering the benefits and limitations of each gene panel, Ms. Daughtry elected to have [facility] CancerNext-Expanded Panel.  The CancerNext-Expanded gene panel offered by [facility] and includes  sequencing, rearrangement, and RNA analysis for the following 71 genes: AIP, ALK, APC, ATM, AXIN2, BAP1, BARD1, BMPR1A, BRCA1, BRCA2, BRIP1, CDC73, CDH1, CDK4, CDKN1B, CDKN2A, CHEK2, CTNNA1, DICER1, FH, FLCN, KIF1B, LZTR1, MAX, MEN1, MET, MLH1, MSH2, MSH3, MSH6, MUTYH, NF1, NF2, NTHL1, PALB2, PHOX2B, PMS2, POT1, PRKAR1A, PTCH1, PTEN, RAD51C, RAD51D, RB1, RET, SDHA, SDHAF2, SDHB, SDHC, SDHD, SMAD4, SMARCA4, SMARCB1, SMARCE1, STK11, SUFU, TMEM127, TP53, TSC1, TSC2, and VHL (sequencing and deletion/duplication); EGFR, EGLN1, HOXB13, KIT, MITF, PDGFRA, POLD1, and POLE (sequencing only); EPCAM and GREM1 (deletion/duplication only).   Based on Ms. Hofferber's personal and family history of cancer, she meets medical criteria for genetic testing. Despite that she meets criteria, she may still have an out of pocket cost. We discussed that if her out of pocket cost for testing is over $100, the laboratory will call and confirm whether she wants to proceed with testing.  If the out of pocket cost of testing is less than $100 she will be  billed by the genetic testing laboratory.   PLAN: After considering the risks, benefits, and limitations, Ms. Greenblatt provided informed consent to pursue genetic testing and the blood sample was sent to [facility] for analysis of the CancerNext-Expanded Panel. Results should be available within approximately 2-3 weeks' time, at which point they will be disclosed by telephone to Ms. Biederman, as will any additional recommendations warranted by these results. Ms. Morsey will receive a summary of her genetic counseling visit and a copy of her results once available. This information will also be available in Epic.   Ms. Haycox questions were answered to her satisfaction today. Our contact information was provided should additional questions or concerns arise. Thank you for the referral and allowing [place] to share in the care of your patient.   Lalla Brothers, MS, [facility] Genetic Counselor [email or web address] (P) [phone]  The patient was seen for a total of 35 minutes in face-to-face genetic counseling. The patient was seen alone.  Drs. Pamelia Hoit and/or Mosetta Putt were available to discuss this case as needed.   _______________________________________________________________________ For Office Staff:  Number of people involved in session: 1 Was an Intern/ student involved with case: yes, Maddy

## 2022-09-06 LAB — LUTEINIZING HORMONE: LH: 4.7 m[IU]/mL

## 2022-09-06 LAB — FOLLICLE STIMULATING HORMONE: FSH: 6.6 m[IU]/mL

## 2022-09-08 LAB — ESTRADIOL, ULTRA SENS: Estradiol, Sensitive: 34.7 pg/mL

## 2022-09-09 ENCOUNTER — Encounter (HOSPITAL_BASED_OUTPATIENT_CLINIC_OR_DEPARTMENT_OTHER): Payer: Self-pay | Admitting: Surgery

## 2022-09-09 ENCOUNTER — Other Ambulatory Visit: Payer: Self-pay

## 2022-09-12 ENCOUNTER — Inpatient Hospital Stay: Payer: Self-pay | Admitting: Dietician

## 2022-09-12 NOTE — Progress Notes (Signed)
Nutrition Assessment: Reached out to patient at home telephone number.    Reason for Assessment: New Patient Assessment   ASSESSMENT: Patient is a 42 year old female who was recently diagnosed with Invasive ductal carcinoma of the right breast.  She has Mastectomy planned for next week.  She is being followed by Dr. Myna Hidalgo, and is otherwise healthy with interest in nutrition and health.  No food allergies, avoids sugar and white carbs, or anything processed.  Eats mostly organic.  She has been intentionally losing weight and uses a diet pattern "Trim healthy momma" plan.  Most of her diet is lean protein, brown rice, Greek yogurts, sometimes Kefir and cheese. Loves fruits and vegetables, oatmeal, salmon over salad Fluids:  water, hot tea, coffee, lemon juice ginger turmeric drink she makes at home, overall 6-8 cups day. Exercises daily.    Nutrition Focused Physical Exam: unable to perform NFPE   Medications: no prescription meds, in addition to meds on file takes an amino acid acid supplement with L Arginine, MVI not every day  Labs: reviewed 09/04/22   Anthropometrics: intentional weight loss recently  Height: 67" Weight:  Reports weight at home down to 141# 09/09/22  145# DBW: 130# BMI: 22.72   Estimated Energy Needs  Kcals: 2000 Protein: 78-98 Fluid: 2 L   NUTRITION DIAGNOSIS: none at this time, interventions below address future wound healing after her planned surgery and survivorship   INTERVENTION:   Relayed that nutrition services are wrap around service provided at no charge and encouraged continued communication if experiencing any nutritional impact symptoms (NIS). Educated on importance of adequate nourishment with calorie and protein energy intake with nutrient dense foods when possible to maintain weight/strength and allow for improved wound healing. Encouraged vit D fortified foods and dairy or plant based based milks for Ca+. Emailed Nutrition Tip sheet   for  breast cancer survivors, resource list for cancer, and product info for Juven and Arginaid for wound healing post surgery with contact information provided.  MONITORING, EVALUATION, GOAL: weight trends, nutrition impact symptoms, PO intake, labs   Next Visit: PRN at patient or provider request.  Gennaro Africa, RDN, LDN Registered Dietitian, [facility] Part Time Remote (Usual office hours: Tuesday-Thursday) Mobile: [phone]

## 2022-09-13 NOTE — H&P (Signed)
Subjective Patient ID: Rachel Singleton is a 42 y.o. female.     HPI   Returns for follow up discussion breast reconstruction prior to planned bilateral mastectomies. Presented with palpable mass right breast. Diagnostic MMG/US showed 3 cm mass right breast 2 o'clock on top of the pectoralis muscle. Diffuse calcifications throughout the right breast are asymmetrically increased compared to the left side noted. [place] axilla right normal. Biopsies labeled right breast upper central and right breast lower central both benign. Biopsy labeled right breast 2 o clock IDC ER/PR+, Her2-.    MRI demonstrated biopsy-proven malignancy 1.9 cm right UIQ abuts the anterior margin of the pectoralis muscle with no visible intervening fat plane.   Plan Oncotype on surgical specimen.    Genetics pending. Mother with breast ca. She underwent lumpectomy and eventual saline implant to that side.   Patient has elected for bilateral mastectomies. She has discussed nipple sparing mastectomies with Dr. Corliss Skains.   Current 34 B. Wt down 15 lb over last 6 months.    Lives with spouse and 5 children ages 32-14. 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    Review of Systems  All other systems reviewed and are negative.     Objective Physical Exam  Cardiovascular: Normal rate, regular rhythm and normal heart sounds.    Pulmonary/Chest Effort normal and breath sounds normal.    Skin   Fitzpatrick 2    Abdomen: volume sufficient for unilateral reconstruction of similar breast size   Breasts: pseudoptosis present SN to nipple 21 L 21 cm BW R 17 L 18 cm CW 13 cm Nipple to IMF R 7 L 7 cm       Assessment/Plan Malignant neoplasm of upper-inner quadrant of right breast in female, estrogen receptor positive (CMS/HCC)   Plan bilateral nipple sparing mastectomies with immediate tissue expander acellular dermis reconstruction.   Reviewed incisions, drains, OR length, hospital stay and recovery. Discussed process of expansion  and implant based risks including rupture, imaging surveillance for silicone implants, infection requiring surgery or removal, contracture. Discussed future surgery dependent on adjuvant treatments. We reviewed importance of having stable place to stay post operative for 4-6 weeks. Reviewed risks of infection if staying in shelter. Reports she will be staying where she is currently residing post operatively.   Discussed use of acellular dermis in reconstruction, cadaveric source, incorporation over several weeks, risk that if has seroma or infection can act as additional nidus for infection if not incorporated. Reviewed this is off label use of ADM.   Discussed prepectoral vs sub pectoral reconstruction. Discussed with patient and benefit of this is no animation deformity, may be less pain. Risk may be more visible rippling over upper poles, greater need of ADM. Reviewed pre pectoral would require larger amount acellular dermis, more drains. Discussed any type reconstruction also risks long term displacement implant and visible rippling. If prepectoral counseled I would recommend silicone implants in future as more options that have less rippling. We discussed any device or flap above pectoralis may obscure recurrent masses at chest wall. We reviewed the MRI findings of tumor abutting the pectoralis. She agrees to prepectoral placement.   Additional risks including but not limited to bleeding, seroma, hematoma, damage to adjacent structures, infection, asymmetry, damage to adjacent structures, need for additional procedures, unacceptable cosmetic result, blood clots in legs or lungs reviewed.    Drain teaching completed. Rx for Bactrim, oxycodone, robaxin given.   Glenna Fellows, MD [facility] Plastic & Reconstructive Surgery  Office/ physician access line  after hours [phone]

## 2022-09-16 ENCOUNTER — Encounter (HOSPITAL_BASED_OUTPATIENT_CLINIC_OR_DEPARTMENT_OTHER): Payer: Self-pay | Admitting: Surgery

## 2022-09-16 NOTE — Anesthesia Preprocedure Evaluation (Addendum)
Anesthesia Evaluation  Patient identified by MRN, date of birth, ID band Patient awake    Reviewed: Allergy & Precautions, NPO status , Patient's Chart, lab work & pertinent test results  Airway Mallampati: II       Dental no notable dental hx. (+) Teeth Intact   Pulmonary asthma    Pulmonary exam normal breath sounds clear to auscultation       Cardiovascular negative cardio ROS Normal cardiovascular exam Rhythm:Regular Rate:Normal     Neuro/Psych negative neurological ROS  negative psych ROS   GI/Hepatic negative GI ROS, Neg liver ROS,,,  Endo/Other  Right Breast Ca  Renal/GU negative Renal ROS  negative genitourinary   Musculoskeletal negative musculoskeletal ROS (+)    Abdominal   Peds  Hematology negative hematology ROS (+)   Anesthesia Other Findings   Reproductive/Obstetrics negative OB ROS                             Anesthesia Physical Anesthesia Plan  ASA: 2  Anesthesia Plan: General   Post-op Pain Management: Minimal or no pain anticipated, Regional block*, Precedex and Tylenol PO (pre-op)*   Induction: Intravenous  PONV Risk Score and Plan: 4 or greater and Treatment may vary due to age or medical condition, Midazolam, Scopolamine patch - Pre-op, Dexamethasone and Ondansetron  Airway Management Planned: LMA  Additional Equipment: None  Intra-op Plan:   Post-operative Plan: Extubation in OR  Informed Consent: I have reviewed the patients History and Physical, chart, labs and discussed the procedure including the risks, benefits and alternatives for the proposed anesthesia with the patient or authorized representative who has indicated his/her understanding and acceptance.     Dental advisory given  Plan Discussed with: CRNA and Anesthesiologist  Anesthesia Plan Comments:         Anesthesia Quick Evaluation

## 2022-09-17 ENCOUNTER — Other Ambulatory Visit: Payer: Self-pay

## 2022-09-17 ENCOUNTER — Encounter (HOSPITAL_BASED_OUTPATIENT_CLINIC_OR_DEPARTMENT_OTHER)
Admission: RE | Disposition: A | Discharge status: Home / Self Care | Payer: Self-pay | Source: Ambulatory Visit | Attending: Plastic Surgery

## 2022-09-17 ENCOUNTER — Ambulatory Visit (HOSPITAL_COMMUNITY)
Admission: RE | Admit: 2022-09-17 | Discharge: 2022-09-17 | Disposition: A | Discharge status: Home / Self Care | Payer: Self-pay | Source: Ambulatory Visit | Attending: Surgery | Admitting: Surgery

## 2022-09-17 ENCOUNTER — Ambulatory Visit (HOSPITAL_BASED_OUTPATIENT_CLINIC_OR_DEPARTMENT_OTHER): Payer: Self-pay | Admitting: Anesthesiology

## 2022-09-17 ENCOUNTER — Encounter (HOSPITAL_BASED_OUTPATIENT_CLINIC_OR_DEPARTMENT_OTHER): Payer: Self-pay | Admitting: Surgery

## 2022-09-17 ENCOUNTER — Ambulatory Visit (HOSPITAL_BASED_OUTPATIENT_CLINIC_OR_DEPARTMENT_OTHER)
Admission: RE | Admit: 2022-09-17 | Discharge: 2022-09-18 | Disposition: A | Discharge status: Home / Self Care | Payer: Self-pay | Source: Ambulatory Visit | Attending: Plastic Surgery | Admitting: Plastic Surgery

## 2022-09-17 DIAGNOSIS — N6022 Fibroadenosis of left breast: Secondary | ICD-10-CM | POA: Insufficient documentation

## 2022-09-17 DIAGNOSIS — C50211 Malignant neoplasm of upper-inner quadrant of right female breast: Secondary | ICD-10-CM

## 2022-09-17 DIAGNOSIS — C50911 Malignant neoplasm of unspecified site of right female breast: Secondary | ICD-10-CM

## 2022-09-17 DIAGNOSIS — N6489 Other specified disorders of breast: Secondary | ICD-10-CM | POA: Insufficient documentation

## 2022-09-17 DIAGNOSIS — Z01818 Encounter for other preprocedural examination: Secondary | ICD-10-CM

## 2022-09-17 DIAGNOSIS — N61 Mastitis without abscess: Secondary | ICD-10-CM | POA: Insufficient documentation

## 2022-09-17 DIAGNOSIS — Z170 Estrogen receptor positive status [ER+]: Secondary | ICD-10-CM

## 2022-09-17 DIAGNOSIS — N6032 Fibrosclerosis of left breast: Secondary | ICD-10-CM | POA: Insufficient documentation

## 2022-09-17 DIAGNOSIS — N6021 Fibroadenosis of right breast: Secondary | ICD-10-CM | POA: Insufficient documentation

## 2022-09-17 DIAGNOSIS — Z803 Family history of malignant neoplasm of breast: Secondary | ICD-10-CM | POA: Insufficient documentation

## 2022-09-17 HISTORY — PX: SIMPLE MASTECTOMY WITH AXILLARY SENTINEL NODE BIOPSY: SHX6098

## 2022-09-17 HISTORY — PX: MASTECTOMY W/ SENTINEL NODE BIOPSY: SHX2001

## 2022-09-17 HISTORY — PX: BREAST RECONSTRUCTION WITH PLACEMENT OF TISSUE EXPANDER AND ALLODERM: SHX6805

## 2022-09-17 LAB — POCT PREGNANCY, URINE: Preg Test, Ur: NEGATIVE

## 2022-09-17 SURGERY — MASTECTOMY WITH SENTINEL LYMPH NODE BIOPSY
Anesthesia: General | Site: Breast | Laterality: Right

## 2022-09-17 MED ORDER — KCL IN DEXTROSE-NACL 20-5-0.45 MEQ/L-%-% IV SOLN
INTRAVENOUS | Status: DC
  Filled 2022-09-17: qty 1000

## 2022-09-17 MED ORDER — FENTANYL CITRATE (PF) 100 MCG/2ML IJ SOLN
INTRAMUSCULAR | Status: AC
  Filled 2022-09-17: qty 2

## 2022-09-17 MED ORDER — DEXAMETHASONE SODIUM PHOSPHATE 10 MG/ML IJ SOLN
INTRAMUSCULAR | Status: AC
  Filled 2022-09-17: qty 1

## 2022-09-17 MED ORDER — ONDANSETRON HCL 4 MG/2ML IJ SOLN
INTRAMUSCULAR | Status: AC
  Filled 2022-09-17: qty 2

## 2022-09-17 MED ORDER — DEXMEDETOMIDINE HCL IN NACL 80 MCG/20ML IV SOLN
INTRAVENOUS | Status: DC | PRN
  Administered 2022-09-17: 12 ug via INTRAVENOUS

## 2022-09-17 MED ORDER — FENTANYL CITRATE (PF) 100 MCG/2ML IJ SOLN
INTRAMUSCULAR | Status: DC | PRN
  Administered 2022-09-17: 25 ug via INTRAVENOUS
  Administered 2022-09-17: 100 ug via INTRAVENOUS

## 2022-09-17 MED ORDER — ACETAMINOPHEN 500 MG PO TABS
1000.0000 mg | ORAL_TABLET | ORAL | Status: AC
  Administered 2022-09-17: 1000 mg via ORAL

## 2022-09-17 MED ORDER — KETOROLAC TROMETHAMINE 30 MG/ML IJ SOLN
INTRAMUSCULAR | Status: AC
  Filled 2022-09-17: qty 1

## 2022-09-17 MED ORDER — CHLORHEXIDINE GLUCONATE CLOTH 2 % EX PADS: 6.0000 | MEDICATED_PAD | Freq: Once | CUTANEOUS | Status: DC

## 2022-09-17 MED ORDER — ACETAMINOPHEN 500 MG PO TABS
ORAL_TABLET | ORAL | Status: AC
  Filled 2022-09-17: qty 2

## 2022-09-17 MED ORDER — CEFAZOLIN SODIUM-DEXTROSE 1-4 GM/50ML-% IV SOLN
1.0000 g | Freq: Three times a day (TID) | INTRAVENOUS | Status: AC
  Administered 2022-09-17 – 2022-09-18 (×3): 1 g via INTRAVENOUS
  Filled 2022-09-17 (×3): qty 50

## 2022-09-17 MED ORDER — ATROPINE SULFATE 0.4 MG/ML IV SOLN
INTRAVENOUS | Status: AC
  Filled 2022-09-17: qty 1

## 2022-09-17 MED ORDER — HYDROMORPHONE HCL 1 MG/ML IJ SOLN
INTRAMUSCULAR | Status: AC
  Filled 2022-09-17: qty 0.5

## 2022-09-17 MED ORDER — 0.9 % SODIUM CHLORIDE (POUR BTL) OPTIME
TOPICAL | Status: DC | PRN
  Administered 2022-09-17: 500 mL

## 2022-09-17 MED ORDER — PHENYLEPHRINE 80 MCG/ML (10ML) SYRINGE FOR IV PUSH (FOR BLOOD PRESSURE SUPPORT)
PREFILLED_SYRINGE | INTRAVENOUS | Status: AC
  Filled 2022-09-17: qty 10

## 2022-09-17 MED ORDER — MIDAZOLAM HCL 2 MG/2ML IJ SOLN
INTRAMUSCULAR | Status: AC
  Filled 2022-09-17: qty 2

## 2022-09-17 MED ORDER — BUPIVACAINE HCL (PF) 0.25 % IJ SOLN
INTRAMUSCULAR | Status: DC | PRN
  Administered 2022-09-17 (×2): 20 mL via PERINEURAL

## 2022-09-17 MED ORDER — ONDANSETRON HCL 4 MG/2ML IJ SOLN
INTRAMUSCULAR | Status: DC | PRN
  Administered 2022-09-17: 4 mg via INTRAVENOUS

## 2022-09-17 MED ORDER — KETOROLAC TROMETHAMINE 30 MG/ML IJ SOLN
30.0000 mg | Freq: Three times a day (TID) | INTRAMUSCULAR | Status: AC
  Administered 2022-09-17 – 2022-09-18 (×3): 30 mg via INTRAVENOUS
  Filled 2022-09-17 (×2): qty 1

## 2022-09-17 MED ORDER — FENTANYL CITRATE (PF) 100 MCG/2ML IJ SOLN
100.0000 ug | Freq: Once | INTRAMUSCULAR | Status: AC
  Administered 2022-09-17: 100 ug via INTRAVENOUS

## 2022-09-17 MED ORDER — BUPIVACAINE LIPOSOME 1.3 % IJ SUSP
INTRAMUSCULAR | Status: DC | PRN
  Administered 2022-09-17 (×2): 5 mL via PERINEURAL

## 2022-09-17 MED ORDER — CEFAZOLIN SODIUM-DEXTROSE 2-4 GM/100ML-% IV SOLN
2.0000 g | INTRAVENOUS | Status: AC
  Administered 2022-09-17: 2 g via INTRAVENOUS

## 2022-09-17 MED ORDER — OXYCODONE HCL 5 MG/5ML PO SOLN: 5.0000 mg | Freq: Once | ORAL | Status: DC | PRN

## 2022-09-17 MED ORDER — DROPERIDOL 2.5 MG/ML IJ SOLN
INTRAMUSCULAR | Status: AC
  Filled 2022-09-17: qty 2

## 2022-09-17 MED ORDER — OXYCODONE HCL 5 MG PO TABS
5.0000 mg | ORAL_TABLET | ORAL | Status: DC | PRN
  Administered 2022-09-17: 5 mg via ORAL
  Filled 2022-09-17: qty 1

## 2022-09-17 MED ORDER — ENOXAPARIN SODIUM 40 MG/0.4ML IJ SOSY: 40.0000 mg | PREFILLED_SYRINGE | INTRAMUSCULAR | Status: DC

## 2022-09-17 MED ORDER — CEFAZOLIN SODIUM-DEXTROSE 2-4 GM/100ML-% IV SOLN
INTRAVENOUS | Status: AC
  Filled 2022-09-17: qty 100

## 2022-09-17 MED ORDER — SUCCINYLCHOLINE CHLORIDE 200 MG/10ML IV SOSY
PREFILLED_SYRINGE | INTRAVENOUS | Status: AC
  Filled 2022-09-17: qty 10

## 2022-09-17 MED ORDER — EPHEDRINE SULFATE (PRESSORS) 50 MG/ML IJ SOLN
INTRAMUSCULAR | Status: DC | PRN
  Administered 2022-09-17: 5 mg via INTRAVENOUS

## 2022-09-17 MED ORDER — DEXAMETHASONE SODIUM PHOSPHATE 4 MG/ML IJ SOLN
INTRAMUSCULAR | Status: DC | PRN
  Administered 2022-09-17: 5 mg via INTRAVENOUS

## 2022-09-17 MED ORDER — SODIUM CHLORIDE 0.9 % IV SOLN
INTRAVENOUS | Status: DC | PRN
  Administered 2022-09-17: 500 mL

## 2022-09-17 MED ORDER — ONDANSETRON HCL 4 MG/2ML IJ SOLN: 4.0000 mg | Freq: Once | INTRAMUSCULAR | Status: DC | PRN

## 2022-09-17 MED ORDER — POVIDONE-IODINE 10 % EX SOLN
CUTANEOUS | Status: DC | PRN
  Administered 2022-09-17: 1 via TOPICAL

## 2022-09-17 MED ORDER — TECHNETIUM TC 99M TILMANOCEPT KIT
1.0000 | PACK | Freq: Once | INTRAVENOUS | Status: AC | PRN
  Administered 2022-09-17: 1 via INTRADERMAL

## 2022-09-17 MED ORDER — HYDROMORPHONE HCL 1 MG/ML IJ SOLN
0.2500 mg | INTRAMUSCULAR | Status: DC | PRN
  Administered 2022-09-17 (×2): 0.5 mg via INTRAVENOUS

## 2022-09-17 MED ORDER — LIDOCAINE 2% (20 MG/ML) 5 ML SYRINGE
INTRAMUSCULAR | Status: AC
  Filled 2022-09-17: qty 5

## 2022-09-17 MED ORDER — OXYCODONE HCL 5 MG PO TABS: 5.0000 mg | ORAL_TABLET | Freq: Once | ORAL | Status: DC | PRN

## 2022-09-17 MED ORDER — LACTATED RINGERS IV SOLN: INTRAVENOUS | Status: DC

## 2022-09-17 MED ORDER — HYDROMORPHONE HCL 1 MG/ML IJ SOLN: 0.5000 mg | INTRAMUSCULAR | Status: DC | PRN

## 2022-09-17 MED ORDER — MIDAZOLAM HCL 2 MG/2ML IJ SOLN
2.0000 mg | Freq: Once | INTRAMUSCULAR | Status: AC
  Administered 2022-09-17: 2 mg via INTRAVENOUS

## 2022-09-17 MED ORDER — ONDANSETRON HCL 4 MG/2ML IJ SOLN: 4.0000 mg | Freq: Four times a day (QID) | INTRAMUSCULAR | Status: DC | PRN

## 2022-09-17 MED ORDER — SODIUM CHLORIDE 0.9 % IV SOLN
INTRAVENOUS | Status: AC
  Filled 2022-09-17: qty 10

## 2022-09-17 MED ORDER — PROPOFOL 10 MG/ML IV BOLUS
INTRAVENOUS | Status: DC | PRN
  Administered 2022-09-17: 150 mg via INTRAVENOUS

## 2022-09-17 MED ORDER — DROPERIDOL 2.5 MG/ML IJ SOLN
0.6250 mg | Freq: Once | INTRAMUSCULAR | Status: AC | PRN
  Administered 2022-09-17: 0.625 mg via INTRAVENOUS

## 2022-09-17 MED ORDER — METHOCARBAMOL 500 MG PO TABS
500.0000 mg | ORAL_TABLET | Freq: Four times a day (QID) | ORAL | Status: DC | PRN
  Administered 2022-09-17: 500 mg via ORAL
  Filled 2022-09-17: qty 1

## 2022-09-17 MED ORDER — MIDAZOLAM HCL 5 MG/5ML IJ SOLN
INTRAMUSCULAR | Status: DC | PRN
  Administered 2022-09-17: 2 mg via INTRAVENOUS

## 2022-09-17 MED ORDER — ONDANSETRON 4 MG PO TBDP
4.0000 mg | ORAL_TABLET | Freq: Four times a day (QID) | ORAL | Status: DC | PRN
  Administered 2022-09-17: 4 mg via ORAL
  Filled 2022-09-17: qty 1

## 2022-09-17 MED ORDER — EPHEDRINE 5 MG/ML INJ
INTRAVENOUS | Status: AC
  Filled 2022-09-17: qty 5

## 2022-09-17 SURGICAL SUPPLY — 96 items
ADH SKN CLS APL DERMABOND .7 (GAUZE/BANDAGES/DRESSINGS) ×8
ALLOGRAFT PERF 16X20 1.6+/-0.4 (Tissue) ×1 IMPLANT
APL PRP STRL LF DISP 70% ISPRP (MISCELLANEOUS) ×8
APL SKNCLS STERI-STRIP NONHPOA (GAUZE/BANDAGES/DRESSINGS)
APPLIER CLIP 9.375 MED OPEN (MISCELLANEOUS) ×4
APR CLP MED 9.3 20 MLT OPN (MISCELLANEOUS) ×4
BAG DECANTER FOR FLEXI CONT (MISCELLANEOUS) ×4 IMPLANT
BENZOIN TINCTURE PRP APPL 2/3 (GAUZE/BANDAGES/DRESSINGS) ×3 IMPLANT
BINDER BREAST LRG (GAUZE/BANDAGES/DRESSINGS) ×1 IMPLANT
BINDER BREAST MEDIUM (GAUZE/BANDAGES/DRESSINGS) IMPLANT
BINDER BREAST XLRG (GAUZE/BANDAGES/DRESSINGS) IMPLANT
BINDER BREAST XXLRG (GAUZE/BANDAGES/DRESSINGS) IMPLANT
BLADE CLIPPER SURG (BLADE) IMPLANT
BLADE HEX COATED 2.75 (ELECTRODE) ×3 IMPLANT
BLADE SURG 10 STRL SS (BLADE) ×5 IMPLANT
BLADE SURG 15 STRL LF DISP TIS (BLADE) ×5 IMPLANT
BLADE SURG 15 STRL SS (BLADE) ×8
BNDG GAUZE DERMACEA FLUFF 4 (GAUZE/BANDAGES/DRESSINGS) ×8 IMPLANT
BNDG GZE DERMACEA 4 6PLY (GAUZE/BANDAGES/DRESSINGS) ×8
CANISTER SUCT 1200ML W/VALVE (MISCELLANEOUS) ×4 IMPLANT
CHLORAPREP W/TINT 26 (MISCELLANEOUS) ×8 IMPLANT
CLIP APPLIE 9.375 MED OPEN (MISCELLANEOUS) ×4 IMPLANT
COVER BACK TABLE 60X90IN (DRAPES) ×4 IMPLANT
COVER MAYO STAND STRL (DRAPES) ×8 IMPLANT
COVER PROBE CYLINDRICAL 5X96 (MISCELLANEOUS) ×4 IMPLANT
DERMABOND ADVANCED .7 DNX12 (GAUZE/BANDAGES/DRESSINGS) ×8 IMPLANT
DRAIN CHANNEL 15F RND FF W/TCR (WOUND CARE) ×5 IMPLANT
DRAIN CHANNEL 19F RND (DRAIN) ×5 IMPLANT
DRAIN RELI 100 BL SUC LF ST (DRAIN) ×16
DRAPE INCISE IOBAN 66X45 STRL (DRAPES) ×1 IMPLANT
DRAPE LAPAROSCOPIC ABDOMINAL (DRAPES) IMPLANT
DRAPE TOP ARMCOVERS (MISCELLANEOUS) ×4 IMPLANT
DRAPE U-SHAPE 76X120 STRL (DRAPES) ×5 IMPLANT
DRAPE UTILITY XL STRL (DRAPES) ×5 IMPLANT
DRSG TEGADERM 4X10 (GAUZE/BANDAGES/DRESSINGS) ×3 IMPLANT
DRSG TEGADERM 4X4.75 (GAUZE/BANDAGES/DRESSINGS) IMPLANT
ELECT BLADE 4.0 EZ CLEAN MEGAD (MISCELLANEOUS) ×4
ELECT BLADE 6.5 EXT (BLADE) ×1 IMPLANT
ELECT COATED BLADE 2.86 ST (ELECTRODE) ×4 IMPLANT
ELECT REM PT RETURN 9FT ADLT (ELECTROSURGICAL) ×4
ELECTRODE BLDE 4.0 EZ CLN MEGD (MISCELLANEOUS) ×4 IMPLANT
ELECTRODE REM PT RTRN 9FT ADLT (ELECTROSURGICAL) ×4 IMPLANT
EVACUATOR SILICONE 100CC (DRAIN) ×7 IMPLANT
EXPANDER TISSUE FORTE 300CC (Breast) ×2 IMPLANT
GAUZE PAD ABD 8X10 STRL (GAUZE/BANDAGES/DRESSINGS) ×9 IMPLANT
GAUZE SPONGE 4X4 12PLY STRL (GAUZE/BANDAGES/DRESSINGS) ×4 IMPLANT
GAUZE SPONGE 4X4 12PLY STRL LF (GAUZE/BANDAGES/DRESSINGS) IMPLANT
GLOVE BIO SURGEON STRL SZ 6 (GLOVE) ×7 IMPLANT
GLOVE BIO SURGEON STRL SZ7 (GLOVE) ×6 IMPLANT
GLOVE BIOGEL PI IND STRL 7.0 (GLOVE) ×2 IMPLANT
GLOVE BIOGEL PI IND STRL 7.5 (GLOVE) ×6 IMPLANT
GLOVE SURG SS PI 7.0 STRL IVOR (GLOVE) ×1 IMPLANT
GOWN STRL REUS W/ TWL LRG LVL3 (GOWN DISPOSABLE) ×11 IMPLANT
GOWN STRL REUS W/TWL LRG LVL3 (GOWN DISPOSABLE) ×20
IV NS 500ML (IV SOLUTION)
IV NS 500ML BAXH (IV SOLUTION) ×3 IMPLANT
KIT FILL ASEPTIC TRANSFER (MISCELLANEOUS) ×3 IMPLANT
LIGHT WAVEGUIDE WIDE FLAT (MISCELLANEOUS) ×1 IMPLANT
MARKER SKIN DUAL TIP RULER LAB (MISCELLANEOUS) IMPLANT
NDL HYPO 25X1 1.5 SAFETY (NEEDLE) ×6 IMPLANT
NDL SAFETY ECLIP 18X1.5 (MISCELLANEOUS) ×4 IMPLANT
NEEDLE HYPO 25X1 1.5 SAFETY (NEEDLE) ×8 IMPLANT
NS IRRIG 1000ML POUR BTL (IV SOLUTION) ×1 IMPLANT
PACK BASIN DAY SURGERY FS (CUSTOM PROCEDURE TRAY) ×4 IMPLANT
PENCIL SMOKE EVACUATOR (MISCELLANEOUS) ×4 IMPLANT
PIN SAFETY STERILE (MISCELLANEOUS) ×4 IMPLANT
PUNCH BIOPSY 4MM DISP (MISCELLANEOUS) IMPLANT
SHEET MEDIUM DRAPE 40X70 STRL (DRAPES) ×4 IMPLANT
SLEEVE SCD COMPRESS KNEE MED (STOCKING) ×4 IMPLANT
SPIKE FLUID TRANSFER (MISCELLANEOUS) ×4 IMPLANT
SPONGE T-LAP 18X18 ~~LOC~~+RFID (SPONGE) ×9 IMPLANT
SPONGE T-LAP 4X18 ~~LOC~~+RFID (SPONGE) IMPLANT
STAPLER VISISTAT 35W (STAPLE) ×4 IMPLANT
STRIP CLOSURE SKIN 1/2X4 (GAUZE/BANDAGES/DRESSINGS) ×3 IMPLANT
SUT CHROMIC 4 0 PS 2 18 (SUTURE) ×4 IMPLANT
SUT ETHILON 2 0 FS 18 (SUTURE) ×5 IMPLANT
SUT MNCRL AB 4-0 PS2 18 (SUTURE) ×5 IMPLANT
SUT PDS AB 2-0 CT2 27 (SUTURE) IMPLANT
SUT SILK 2 0 SH (SUTURE) ×5 IMPLANT
SUT VIC AB 3-0 SH 27 (SUTURE) ×8
SUT VIC AB 3-0 SH 27X BRD (SUTURE) ×5 IMPLANT
SUT VIC AB 4-0 PS2 18 (SUTURE) ×5 IMPLANT
SUT VICRYL 0 CT-2 (SUTURE) ×3 IMPLANT
SUT VICRYL 3-0 CR8 SH (SUTURE) ×3 IMPLANT
SUT VLOC 180 0 24IN GS25 (SUTURE) ×2 IMPLANT
SYR 50ML LL SCALE MARK (SYRINGE) ×1 IMPLANT
SYR BULB IRRIG 60ML STRL (SYRINGE) ×4 IMPLANT
SYR CONTROL 10ML LL (SYRINGE) ×7 IMPLANT
TAPE MEASURE VINYL STERILE (MISCELLANEOUS) ×4 IMPLANT
TISSUE EXPANDER FORTE 300CC (Breast) ×8 IMPLANT
TOWEL GREEN STERILE FF (TOWEL DISPOSABLE) ×8 IMPLANT
TRACER MAGTRACE VIAL (MISCELLANEOUS) IMPLANT
TRAY FAXITRON CT DISP (TRAY / TRAY PROCEDURE) ×3 IMPLANT
TUBE CONNECTING 20X1/4 (TUBING) ×4 IMPLANT
UNDERPAD 30X36 HEAVY ABSORB (UNDERPADS AND DIAPERS) ×8 IMPLANT
YANKAUER SUCT BULB TIP NO VENT (SUCTIONS) ×4 IMPLANT

## 2022-09-17 NOTE — Progress Notes (Signed)
Assisted Dr. Foster with left, right, pectoralis, ultrasound guided block. Side rails up, monitors on throughout procedure. See vital signs in flow sheet. Tolerated Procedure well. 

## 2022-09-17 NOTE — Brief Op Note (Signed)
09/17/2022  10:15 AM  PATIENT:  Rachel Singleton  42 y.o. female  PRE-OPERATIVE DIAGNOSIS:  RIGHT BREAST INVASIVE DUCTAL CARCINOMA  POST-OPERATIVE DIAGNOSIS:  RIGHT BREAST INVASIVE DUCTAL CARCINOMA  PROCEDURE:  Right nipple sparing mastectomy with sentinel lymph node biopsy/ left risk-reducing nipple sparing mastectomy  SURGEON:  Surgeon(s) and Role: Panel 1:    Manus Rudd, MD - Primary Panel 2:    Glenna Fellows, MD - Primary  PHYSICIAN ASSISTANT:   ASSISTANTS: none   ANESTHESIA:   general and bilateral pectoralis blocks  EBL:  25 mL    SPECIMEN:  Left mastectomy/ left nipple biopsy/ right axillary sentinel lymph node #1/ right SLN #2/ right mastectomy/ right nipple biopsy  DISPOSITION OF SPECIMEN:  PATHOLOGY   DICTATION: .Dragon Dictation  PLAN OF CARE: Admit for overnight observation  PATIENT DISPOSITION:  PACU - hemodynamically stable.   Delay start of Pharmacological VTE agent (>24hrs) due to surgical blood loss or risk of bleeding: yes  Wilmon Arms. Corliss Skains, MD, [facility] Surgery  General Surgery   09/17/2022 10:17 AM

## 2022-09-17 NOTE — Anesthesia Procedure Notes (Signed)
Anesthesia Regional Block: Pectoralis block   Pre-Anesthetic Checklist: , timeout performed,  Correct Patient, Correct Site, Correct Laterality,  Correct Procedure, Correct Position, site marked,  Risks and benefits discussed,  Surgical consent,  Pre-op evaluation,  At surgeon's request and post-op pain management  Laterality: Right  Prep: chloraprep       Needles:  Injection technique: Single-shot  Needle Type: Echogenic Stimulator Needle     Needle Length: 10cm  Needle Gauge: 21   Needle insertion depth: 4 cm   Additional Needles:   Procedures:,,,, ultrasound used (permanent image in chart),,    Narrative:  Start time: 09/17/2022 7:10 AM End time: 09/17/2022 7:15 AM Injection made incrementally with aspirations every 5 mL.  Performed by: Personally  Anesthesiologist: Mal Amabile, MD  Additional Notes: Timeout performed. Patient sedated. Relevant anatomy ID'd using [place]. Incremental 2-30ml injection of LA with frequent aspiration. Patient tolerated procedure well.     Right Pectoralis Block

## 2022-09-17 NOTE — Op Note (Signed)
Operative Note   DATE OF OPERATION: 7.2.2024  LOCATION:  [facility]  SURGICAL DIVISION: Plastic Surgery  PREOPERATIVE DIAGNOSES:  Right breast cancer UIQ ER+  POSTOPERATIVE DIAGNOSES:  same  PROCEDURE:  1. Bilateral breast reconstruction with tissue expanders 2. Acellular dermis (Alloderm) to bilateral chest 320 cm2  SURGEON: Glenna Fellows MD MBA  ASSISTANT: none  ANESTHESIA:  General.   EBL: 45 ml for entire procedure  COMPLICATIONS: None immediate.   INDICATIONS FOR PROCEDURE:  The patient, Rachel Singleton, is a 42 y.o. female born on [birth year], is here for immediate prepectoral tissue expander based reconstruction following nipple sparing mastectomies.   FINDINGS: Natrelle 133S FV -11T 300 ml tissue expanders placed bilateral initial fill volume 150 ml air bilateral. RIGHT SN [id number] LEFT SN [id number]  DESCRIPTION OF PROCEDURE:  The patient was marked with the patient in the preoperative area to mark sternal notch, chest midline, anterior axillary lines and inframammary folds. The patient's operative site was prepped and draped in a sterile fashion. A time out was performed and all information was confirmed to be correct. I assisted in mastectomies with exposure and retraction. Following completion of mastectomies, reconstruction began on the left side. Hemostasis ensured. Cavity irrigated with saline solution containing Ancef, gentamicin, and Betadine. A 19 Fr drain was placed in subcutaneous position laterally and a 15 Fr drain placed along inframammary fold. Each secured to skin with 2-0 nylon. The tissue expanders were prepared on back table prior in insertion. The expander was filled with air. Perforated acellular dermis was draped over anterior surface expander. The ADM was then secured to itself over posterior surface of expander with 4-0 chromic. Redundant folds acellular dermis excised so that the ADM lay flat without folds over air filled expander. The  expander was secured to fascia over lateral sternal border with a 0 vicryl. The lateral tab was also secured to pectoralis muscle with 0-vicryl. The ADM was secured to pectoralis muscle and chest wall along inferior border at inframammary fold with 0 V-lock suture. Laterally the mastectomy flap over posterior axillary line was advanced anteriorly and the subcutaneous tissue and superficial fascia was secured to pectoralis and serratus muscles with 0-vicryl. Skin closure completed with 3-0 vicryl in fascial layer and 4-0 vicryl in dermis. Skin closure completed with 4-0 monocryl subcuticular.   I then directed my attention to right chest where similar irrigation and drain placement completed. The prepared expander with ADM secured over anterior surface was placed in left chest and tabs secured to chest wall and pectoralis muscle with 0- vicryl suture. The acellular dermis at inframammary fold was secured to chest wall with 0 V-lock suture. Laterally the mastectomy flap over posterior axillary line was advanced anteriorly and the subcutaneous tissue and superficial fascia was secured to pectoralis and serratus muscles with 0-vicryl. Skin closure completed with 3-0 vicryl in fascial layer and 4-0 vicryl in dermis. Skin closure completed with 4-0 monocryl subcuticular. The mastectomy flaps were redraped so that NAC was symmetric from sternal notch and chest midline. Dermabond applied over incisions. Tegaderm dressings applied followed by dry dressing, breast binder.  The patient was allowed to wake from anesthesia, extubated and taken to the recovery room in satisfactory condition.   SPECIMENS: none  DRAINS: 15 and 19 Fr JP in right and left subcutaneous chest  Glenna Fellows, MD [facility] Plastic & Reconstructive Surgery  Office/ physician access line after hours [phone]

## 2022-09-17 NOTE — Progress Notes (Signed)
Emotional support provided through nuclear medicine breast injections. Vital signs stable. Patient tolerated well.  

## 2022-09-17 NOTE — Interval H&P Note (Signed)
History and Physical Interval Note:  09/17/2022 6:49 AM  Rachel Singleton  has presented today for surgery, with the diagnosis of RIGHT BREAST INVASIVE DUCTAL CARCINOMA.  The various methods of treatment have been discussed with the patient and family. After consideration of risks, benefits and other options for treatment, the patient has consented to  bilateral breast reconstruction with tissue expanders acellular dermis as a surgical intervention.  The patient's history has been reviewed, patient examined, no change in status, stable for surgery.  I have reviewed the patient's chart and labs.  Questions were answered to the patient's satisfaction.     Irean Hong Broderick Fonseca

## 2022-09-17 NOTE — Op Note (Signed)
Pre-op diagnosis: Invasive ductal carcinoma right breast Postop diagnosis: Same Procedure performed: Right nipple sparing mastectomy with sentinel lymph node biopsy Left risk-reducing nipple sparing mastectomy Surgeon:Hammad Finkler K Supriya Beaston Co-Surgeon:  Dr. Glenna Fellows Anesthesia: General with bilateral pectoralis blocks Indications:This is a healthy 42 year old female with a family history of breast cancer in her mother in her 9's who presented recently with a palpable mass in the right upper inner quadrant.  No previous mammograms.  She underwent diagnostic mammograms and ultrasound that showed a 3 cm mass in the right breast at 2:00 sitting on the pectoralis muscle.  She also has diffuse calcifications bilaterally - R>L.  She underwent biopsy of two areas of calcifications on the right, as well as the right breast mass.  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  MRI revealed no other findings except the right upper inner quadrant malignancy.  Description of procedure: The patient is brought to the operating room and placed in the supine position on the operating room table.  After adequate level general anesthesia was obtained, her chest and bilateral axilla were prepped with ChloraPrep and draped in sterile fashion.  A timeout was taken to ensure the proper patient and proper procedure.  We began on the patient's left side.  We made a incision in her inframammary crease.  We dissected down to the chest wall.  We then dissected the breast tissue off of the pectoralis muscle.  We took the anterior fascia with the specimen.  We continued dissecting until we had reached the borders of the left breast.  We then began dissecting anteriorly.  We dissected the breast off of the overlying skin flaps leaving a very thin layer of tissue to be allow perfusion.  We then dissected the entire breast to free and removed it from the wound.  We  oriented the left breast with a long suture lateral, short stitch superior, and a single suture at the nipple.  We then took a separate nipple biopsy and sent this for pathology.  We inspected the wound carefully for hemostasis.  We then turned our attention to the right side.  The patient had been injected with technetium sulfur colloid in preop.  I interrogated her axilla and there was an area of activity.  We made it 3 cm incision over this area of activity.  I dissected into the axillary contents.  We identified 2 adjacent lymph nodes that were removed and sent as sentinel lymph node #1 and sentinel lymph node #2.  We inspected for hemostasis.  The wound was closed with 3-0 Vicryl and 4-0 Monocryl.  We then made an inframammary incision on the right.  I dissected the breast tissue off the pectoralis muscle in similar fashion.  We then dissected the anterior surface of the breast off of the overlying dermis and subcutaneous fat.  The specimen was removed and oriented in similar fashion with a long suture lateral, short stitch superior, and a single suture at the nipple.  A biopsy of the posterior surface of the nipple was also taken.  I visually inspected the posterior margin of the breast specimen.  The cancer is palpable in the right upper inner quadrant.  However at the posterior margin is grossly negative.  There are no palpable nodules on the pectoralis muscle.  We inspected again for hemostasis.  I then turned the procedure over to Dr. Leta Baptist for reconstruction.  Molli Hazard  Kerman Passey, MD, [facility] Surgery  General Surgery   09/17/2022 10:42 AM

## 2022-09-17 NOTE — Anesthesia Procedure Notes (Signed)
Anesthesia Regional Block: Pectoralis block   Pre-Anesthetic Checklist: , timeout performed,  Correct Patient, Correct Site, Correct Laterality,  Correct Procedure, Correct Position, site marked,  Risks and benefits discussed,  Surgical consent,  Pre-op evaluation,  At surgeon's request and post-op pain management  Laterality: Left  Prep: chloraprep       Needles:  Injection technique: Single-shot  Needle Type: Echogenic Stimulator Needle     Needle Length: 10cm  Needle Gauge: 21   Needle insertion depth: 4 cm   Additional Needles:   Procedures:,,,, ultrasound used (permanent image in chart),,    Narrative:  Start time: 09/17/2022 7:15 AM End time: 09/17/2022 7:20 AM Injection made incrementally with aspirations every 5 mL.  Performed by: Personally  Anesthesiologist: Mal Amabile, MD  Additional Notes: Timeout performed. Patient sedated. Relevant anatomy ID'd using [place]. Incremental 2-66ml injection of LA with frequent aspiration. Patient tolerated procedure well.

## 2022-09-17 NOTE — Transfer of Care (Signed)
Immediate Anesthesia Transfer of Care Note  Patient: Rachel Singleton  Procedure(s) Performed: RIGHT MASTECTOMY WITH SENTINEL LYMPH NODE BIOPSY (Right: Breast) LEFT SIMPLE MASTECTOMY (Left: Breast) BREAST RECONSTRUCTION WITH PLACEMENT OF TISSUE EXPANDER AND ALLODERM (Bilateral: Breast)  Patient Location: PACU  Anesthesia Type:General  Level of Consciousness: awake, drowsy, and patient cooperative  Airway & Oxygen Therapy: Patient Spontanous Breathing and Patient connected to face mask oxygen  Post-op Assessment: Report given to RN and Post -op Vital signs reviewed and stable  Post vital signs: Reviewed and stable  Last Vitals:  Vitals Value Taken Time  BP 100/54 09/17/22 1201  Temp    Pulse 93 09/17/22 1202  Resp 18 09/17/22 1202  SpO2 98 % 09/17/22 1202  Vitals shown include unvalidated device data.  Last Pain:  Vitals:   09/17/22 0646  TempSrc: Oral  PainSc: 0-No pain         Complications: No notable events documented.

## 2022-09-17 NOTE — Interval H&P Note (Signed)
History and Physical Interval Note:  09/17/2022 7:02 AM  Rachel Singleton  has presented today for surgery, with the diagnosis of RIGHT BREAST INVASIVE DUCTAL CARCINOMA.  The various methods of treatment have been discussed with the patient and family. After consideration of risks, benefits and other options for treatment, the patient has consented to  Procedure(s): RIGHT MASTECTOMY WITH SENTINEL LYMPH NODE BIOPSY (Right) LEFT SIMPLE MASTECTOMY (Left) BREAST RECONSTRUCTION WITH PLACEMENT OF TISSUE EXPANDER AND FLEX HD (ACELLULAR HYDRATED DERMIS) (Bilateral) as a surgical intervention.  The patient's history has been reviewed, patient examined, no change in status, stable for surgery.  I have reviewed the patient's chart and labs.  Questions were answered to the patient's satisfaction.     [name]

## 2022-09-17 NOTE — Anesthesia Procedure Notes (Signed)
Procedure Name: LMA Insertion Date/Time: 09/17/2022 7:46 AM  Performed by: Ronnette Hila, CRNAPre-anesthesia Checklist: Patient identified, Emergency Drugs available, Suction available and Patient being monitored Patient Re-evaluated:Patient Re-evaluated prior to induction Oxygen Delivery Method: Circle system utilized Preoxygenation: Pre-oxygenation with 100% oxygen Induction Type: IV induction Ventilation: Mask ventilation without difficulty LMA: LMA inserted LMA Size: 3.0 Number of attempts: 1 Airway Equipment and Method: Bite block Placement Confirmation: positive ETCO2 Tube secured with: Tape Dental Injury: Teeth and Oropharynx as per pre-operative assessment

## 2022-09-17 NOTE — Anesthesia Postprocedure Evaluation (Signed)
Anesthesia Post Note  Patient: Rachel Singleton  Procedure(s) Performed: RIGHT MASTECTOMY WITH SENTINEL LYMPH NODE BIOPSY (Right: Breast) LEFT SIMPLE MASTECTOMY (Left: Breast) BREAST RECONSTRUCTION WITH PLACEMENT OF TISSUE EXPANDER AND ALLODERM (Bilateral: Breast)     Patient location during evaluation: PACU Anesthesia Type: General Level of consciousness: awake and alert and oriented Pain management: pain level controlled Vital Signs Assessment: post-procedure vital signs reviewed and stable Respiratory status: spontaneous breathing, nonlabored ventilation and respiratory function stable Cardiovascular status: blood pressure returned to baseline and stable Postop Assessment: no apparent nausea or vomiting Anesthetic complications: no   No notable events documented.  Last Vitals:  Vitals:   09/17/22 1215 09/17/22 1245  BP: (!) 84/47 93/76  Pulse: 94 80  Resp: 20 18  Temp:    SpO2: 98% 99%    Last Pain:  Vitals:   09/17/22 1245  TempSrc:   PainSc: 5                  Adedamola Seto A.

## 2022-09-18 ENCOUNTER — Encounter (HOSPITAL_BASED_OUTPATIENT_CLINIC_OR_DEPARTMENT_OTHER): Payer: Self-pay | Admitting: Surgery

## 2022-09-18 MED ORDER — KETOROLAC TROMETHAMINE 30 MG/ML IJ SOLN
INTRAMUSCULAR | Status: AC
  Filled 2022-09-18: qty 1

## 2022-09-18 NOTE — Discharge Instructions (Signed)
°Post Anesthesia Home Care Instructions ° °Activity: °Get plenty of rest for the remainder of the day. A responsible individual must stay with you for 24 hours following the procedure.  °For the next 24 hours, DO NOT: °-Drive a car °-Operate machinery °-Drink alcoholic beverages °-Take any medication unless instructed by your physician °-Make any legal decisions or sign important papers. ° °Meals: °Start with liquid foods such as gelatin or soup. Progress to regular foods as tolerated. Avoid greasy, spicy, heavy foods. If nausea and/or vomiting occur, drink only clear liquids until the nausea and/or vomiting subsides. Call your physician if vomiting continues. ° °Special Instructions/Symptoms: °Your throat may feel dry or sore from the anesthesia or the breathing tube placed in your throat during surgery. If this causes discomfort, gargle with warm salt water. The discomfort should disappear within 24 hours. ° °If you had a scopolamine patch placed behind your ear for the management of post- operative nausea and/or vomiting: ° °1. The medication in the patch is effective for 72 hours, after which it should be removed.  Wrap patch in a tissue and discard in the trash. Wash hands thoroughly with soap and water. °2. You may remove the patch earlier than 72 hours if you experience unpleasant side effects which may include dry mouth, dizziness or visual disturbances. °3. Avoid touching the patch. Wash your hands with soap and water after contact with the patch. °   °Information for Discharge Teaching: °EXPAREL (bupivacaine liposome injectable suspension)  ° °Your surgeon or anesthesiologist gave you EXPAREL(bupivacaine) to help control your pain after surgery.  °· EXPAREL is a local anesthetic that provides pain relief by numbing the tissue around the surgical site. °· EXPAREL is designed to release pain medication over time and can control pain for up to 72 hours. °· Depending on how you respond to EXPAREL, you may  require less pain medication during your recovery. ° °Possible side effects: °· Temporary loss of sensation or ability to move in the area where bupivacaine was injected. °· Nausea, vomiting, constipation °· Rarely, numbness and tingling in your mouth or lips, lightheadedness, or anxiety may occur. °· Call your doctor right away if you think you may be experiencing any of these sensations, or if you have other questions regarding possible side effects. ° °Follow all other discharge instructions given to you by your surgeon or nurse. Eat a healthy diet and drink plenty of water or other fluids. ° °If you return to the hospital for any reason within 96 hours following the administration of EXPAREL, it is important for health care providers to know that you have received this anesthetic. A teal colored band has been placed on your arm with the date, time and amount of EXPAREL you have received in order to alert and inform your health care providers. Please leave this armband in place for the full 96 hours following administration, and then you may remove the band. ° °About my Jackson-Pratt Bulb Drain ° °What is a Jackson-Pratt bulb? °A Jackson-Pratt is a soft, round device used to collect drainage. It is connected to a long, thin drainage catheter, which is held in place by one or two small stiches near your surgical incision site. When the bulb is squeezed, it forms a vacuum, forcing the drainage to empty into the bulb. ° °Emptying the Jackson-Pratt bulb- °To empty the bulb: °1. Release the plug on the top of the bulb. °2. Pour the bulb's contents into a measuring container which your nurse will provide. °  3. Record the time emptied and amount of drainage. Empty the drain(s) as often as your     doctor or nurse recommends. ° °Date                  Time                    Amount (Drain 1)                 Amount (Drain  2) ° °_____________________________________________________________________ ° °_____________________________________________________________________ ° °_____________________________________________________________________ ° °_____________________________________________________________________ ° °_____________________________________________________________________ ° °_____________________________________________________________________ ° °_____________________________________________________________________ ° °_____________________________________________________________________ ° °Squeezing the Jackson-Pratt Bulb- °To squeeze the bulb: °1. Make sure the plug at the top of the bulb is open. °2. Squeeze the bulb tightly in your fist. You will hear air squeezing from the bulb. °3. Replace the plug while the bulb is squeezed. °4. Use a safety pin to attach the bulb to your clothing. This will keep the catheter from     pulling at the bulb insertion site. ° °When to call your doctor- °Call your doctor if: °· Drain site becomes red, swollen or hot. °· You have a fever greater than 101 degrees F. °· There is oozing at the drain site. °· Drain falls out (apply a guaze bandage over the drain hole and secure it with tape). °· Drainage increases daily not related to activity patterns. (You will usually have more drainage when you are active than when you are resting.) °· Drainage has a bad odor. ° ° °

## 2022-09-18 NOTE — Progress Notes (Signed)
1 Day Post-Op   Subjective/Chief Complaint: Patient appears comfortable - minimal pain medication requirement Nausea has resolved Drain output serosanguinous   Objective: Vital signs in last 24 hours: Temp:  [97.3 F (36.3 C)-98.4 F (36.9 C)] 98.4 F (36.9 C) (07/03 0530) Pulse Rate:  [68-94] 85 (07/03 0530) Resp:  [9-20] 16 (07/03 0530) BP: (84-119)/(47-77) 103/68 (07/03 0530) SpO2:  [95 %-100 %] 98 % (07/03 0530)    Intake/Output from previous day: 07/02 0701 - 07/03 0700 In: 1950 [P.O.:690; I.V.:1250] Out: 2223 [Urine:2050; Drains:128; Blood:45] Intake/Output this shift: Total I/O In: 240 [P.O.:240] Out: 1285 [Urine:1250; Drains:35]  Skin flaps appear viable Drains functioning with serosanguinous output   Anti-infectives: Anti-infectives (From admission, onward)    Start     Dose/Rate Route Frequency Ordered Stop   09/17/22 1415  ceFAZolin (ANCEF) IVPB 1 g/50 mL premix        1 g 100 mL/hr over 30 Minutes Intravenous Every 8 hours 09/17/22 1412 09/18/22 1359   09/17/22 1038  ceFAZolin 1 g / gentamicin 80 mg in NS 500 mL surgical irrigation  Status:  Discontinued          As needed 09/17/22 1041 09/17/22 1158   09/17/22 0630  ceFAZolin (ANCEF) IVPB 2g/100 mL premix        2 g 200 mL/hr over 30 Minutes Intravenous On call to O.R. 09/17/22 0624 09/17/22 0730       Assessment/Plan: s/p Procedure(s): RIGHT MASTECTOMY WITH SENTINEL LYMPH NODE BIOPSY (Right) LEFT SIMPLE MASTECTOMY (Left) BREAST RECONSTRUCTION WITH PLACEMENT OF TISSUE EXPANDER AND ALLODERM (Bilateral)  Pathology pending - will contact patient with results Drain care per Dr. Leta Baptist   LOS: 0 days    [name] 09/18/2022

## 2022-09-18 NOTE — Discharge Summary (Signed)
Physician Discharge Summary  Patient ID: Rachel Singleton MRN: [id number] DOB/AGE: 1980-05-31 42 y.o.  Admit date: 09/17/2022 Discharge date: 09/18/2022  Admission Diagnoses: Right breast cancer  Discharge Diagnoses:  Principal Problem:   Breast cancer, right [facility])   Discharged Condition: stable  Hospital Course: Patient did well post operatively tolerating diet, pain controlled, and ambulatory with minimal assist. Patient instructed on bathing and drain care.  Treatments: surgery: bilateral nipple sparing mastectomies right sentinel node bilateral tissue expander acellular dermis reconstruction 7.2.2024  Discharge Exam: Blood pressure 103/68, pulse 89, temperature 98.4 F (36.9 C), resp. rate 16, height 5\' 7"  (1.702 m), weight 63.5 kg, last menstrual period 09/04/2022, SpO2 100 %, unknown if currently breastfeeding. Incision/Wound: incisions intact Tegaderms in place drains serosanguinous no hematoma  Disposition: Discharge disposition: 01-Home or Self Care       Discharge Instructions     Call MD for:  redness, tenderness, or signs of infection (pain, swelling, bleeding, redness, odor or green/yellow discharge around incision site)   Complete by: As directed    Call MD for:  temperature >100.5   Complete by: As directed    Discharge instructions   Complete by: As directed    Ok to remove dressings and shower am 7.4.24. Soap and water ok, pat Tegaderms dry. Do not remove Tegaderms. No creams or ointments over incisions. Do not let drains dangle in shower, attach to lanyard or similar.Strip and record drains twice daily and bring log to clinic visit.  Breast binder or soft compression bra all other times.  Ok to raise arms above shoulders for bathing and dressing.  No house yard work or exercise until cleared by MD.   Recommend ibuprofen with meals to aid with pain control. Also ok to use Tylenol for pain. Patient received all Rx preop. Recommend Miralax or Dulcolax as  needed for constipation.   Driving Restrictions   Complete by: As directed    No driving for 2 weeks then no driving if taking prescription pain medication   Lifting restrictions   Complete by: As directed    No lifting > 5-10 lbs until cleared by MD   Resume previous diet   Complete by: As directed       Allergies as of 09/18/2022       Reactions   Aluminum-containing Compounds Hives, Rash        Medication List     TAKE these medications    Turmeric 400 MG Caps Take by mouth.   vitamin C 1000 MG tablet Take 1,000 mg by mouth daily.   WHEAT GERM OIL PO Take by mouth.   Zinc 30 MG Caps Take by mouth.        Follow-up Information     Glenna Fellows, MD Follow up in 1 week(s).   Specialty: Plastic Surgery Why: as scheduled Contact information: [address] [place] [address] [phone]         Manus Rudd, MD. Schedule an appointment as soon as possible for a visit in 3 week(s).   Specialty: General Surgery Contact information: [address] [place] [place] [address] [phone]                 Signed: Glenna Fellows 09/18/2022, 7:47 AM

## 2022-09-20 ENCOUNTER — Encounter: Payer: Self-pay | Admitting: Genetic Counselor

## 2022-09-20 ENCOUNTER — Telehealth: Payer: Self-pay | Admitting: Genetic Counselor

## 2022-09-20 DIAGNOSIS — Z1379 Encounter for other screening for genetic and chromosomal anomalies: Secondary | ICD-10-CM | POA: Insufficient documentation

## 2022-09-20 LAB — SURGICAL PATHOLOGY

## 2022-09-20 NOTE — Telephone Encounter (Signed)
I contacted Ms. Gravely to discuss her genetic testing results. No pathogenic variants were identified in the 71 genes analyzed. Of note, a variant of uncertain significance was identified in the POLE gene. Detailed clinic note to follow.  The test report has been scanned into EPIC and is located under the Molecular Pathology section of the Results Review tab.  A portion of the result report is included below for reference.   Lalla Brothers, MS, [facility] Genetic Counselor [email or web address] (P) [phone]

## 2022-09-23 ENCOUNTER — Encounter: Payer: Self-pay | Admitting: *Deleted

## 2022-09-23 ENCOUNTER — Telehealth: Payer: Self-pay | Admitting: *Deleted

## 2022-09-23 NOTE — Telephone Encounter (Signed)
Per scheduling message Asher Muir - called and gave upcoming appointments - requested callback to confirm.

## 2022-09-23 NOTE — Progress Notes (Signed)
Per Dr Myna Hidalgo, request for Oncotype DX Breast Recurrence Score sent on specimen [id number] DOS 09/17/2022.  Oncology Nurse Navigator Documentation     09/23/2022   11:30 AM  Oncology Nurse Navigator Flowsheets  Phase of Treatment Surgery  Surgery Actual Start Date: 09/17/2022  Navigator Follow Up Date: 10/16/2022  Navigator Follow Up Reason: Follow-up Appointment  Navigator Location [facility]  Navigator Encounter Type Molecular Studies  Patient Visit Type MedOnc  Treatment Phase Active Tx  Barriers/Navigation Needs Coordination of Care;Education  Interventions Coordination of Care  Acuity Level 2-Minimal Needs (1-2 Barriers Identified)  Coordination of Care Pathology;Appts  Support Groups/Services Friends and Family  Time Spent with Patient 30  Genetic Counseling Type Urgent  Genetic Counseling Date 09/05/2022

## 2022-09-25 ENCOUNTER — Telehealth: Payer: Self-pay | Admitting: *Deleted

## 2022-09-25 NOTE — Telephone Encounter (Signed)
I called the patient to let her know that Dr. Mitzi Hansen does not feel that radiation therapy is warranted based on her pathology report from her recent surgery.  Patient was very pleased with this news.  Lind Covert RN, BSN

## 2022-09-27 ENCOUNTER — Ambulatory Visit: Payer: Self-pay | Admitting: Genetic Counselor

## 2022-09-27 DIAGNOSIS — Z1379 Encounter for other screening for genetic and chromosomal anomalies: Secondary | ICD-10-CM

## 2022-09-27 NOTE — Progress Notes (Signed)
HPI:   Rachel Singleton was previously seen in the [facility] due to a personal and family history of cancer and concerns regarding a hereditary predisposition to cancer. Please refer to our prior cancer genetics clinic note for more information regarding our discussion, assessment and recommendations, at the time. Rachel Singleton recent genetic test results were disclosed to her, as were recommendations warranted by these results. These results and recommendations are discussed in more detail below.  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 Report date is 09/17/2022.  The CancerNext-Expanded gene panel offered by [facility] and includes sequencing, rearrangement, and RNA analysis for the following 71 genes: AIP, ALK, APC, ATM, AXIN2, BAP1, BARD1, BMPR1A, BRCA1, BRCA2, BRIP1, CDC73, CDH1, CDK4, CDKN1B, CDKN2A, CHEK2, CTNNA1, DICER1, FH, FLCN, KIF1B, LZTR1, MAX, MEN1, MET, MLH1, MSH2, MSH3, MSH6, MUTYH, NF1, NF2, NTHL1, PALB2, PHOX2B, PMS2, POT1, PRKAR1A, PTCH1, PTEN, RAD51C, RAD51D, RB1, RET, SDHA, SDHAF2, SDHB, SDHC, SDHD, SMAD4, SMARCA4, SMARCB1, SMARCE1, STK11, SUFU, TMEM127, TP53, TSC1, TSC2, and VHL (sequencing and deletion/duplication); EGFR, EGLN1, HOXB13, KIT, MITF, PDGFRA, POLD1, and POLE (sequencing only); EPCAM and GREM1  (deletion/duplication only).      FAMILY HISTORY:  We obtained a detailed, 4-generation family history.  Significant diagnoses are listed below:      Family History  Problem Relation Age of Onset   Breast cancer Mother 59        DCIS (-/-)   Heart disease Father     Heart attack Father     Cystic fibrosis Brother     Skin cancer Maternal Aunt 27 - 69   Breast cancer Paternal Aunt 74 - 69   Colon cancer Maternal Grandfather 56 - 75   Breast cancer Other 41 - 55             Rachel Singleton's mother was diagnosed with DCIS (ER/PR negative) at age 75, she had a lumpectomy and radiation as her treatment. She has 3 maternal aunts and 1 was diagnosed with skin cancer in her 62s. Her maternal grandfather was diagnosed with colon cancer in his 74s, he died at age 57. She has 2 maternal great aunts who were diagnosed with breast cancer in their 15s, both are deceased. Rachel Singleton father was diagnosed with skin cancer (unknown type) in his mid 53s. She has 2 paternal aunts and 1 was diagnosed with breast cancer in her 45s, she died at age 30. One paternal first cousin once removed was diagnosed with breast cancer in her 49s twice. Rachel Singleton is unaware of previous family history of genetic testing for hereditary cancer risks. There is no reported Ashkenazi Jewish ancestry. There is no known consanguinity.   GENETIC TEST RESULTS:  The [facility] CancerNext-Expanded Panel found no pathogenic mutations.   The CancerNext-Expanded gene panel offered by [facility] and includes sequencing, rearrangement, and RNA analysis for the following 71 genes: AIP,  ALK, APC, ATM, AXIN2, BAP1, BARD1, BMPR1A, BRCA1, BRCA2, BRIP1, CDC73, CDH1, CDK4, CDKN1B, CDKN2A, CHEK2, CTNNA1, DICER1, FH, FLCN, KIF1B, LZTR1, MAX, MEN1, MET, MLH1, MSH2, MSH3, MSH6, MUTYH, NF1, NF2, NTHL1, PALB2, PHOX2B, PMS2, POT1, PRKAR1A, PTCH1, PTEN, RAD51C, RAD51D, RB1, RET, SDHA, SDHAF2, SDHB, SDHC, SDHD, SMAD4, SMARCA4, SMARCB1, SMARCE1, STK11,  SUFU, TMEM127, TP53, TSC1, TSC2, and VHL (sequencing and deletion/duplication); EGFR, EGLN1, HOXB13, KIT, MITF, PDGFRA, POLD1, and POLE (sequencing only); EPCAM and GREM1 (deletion/duplication only).   The test report has been scanned into EPIC and is located under the Molecular Pathology section of the Results Review tab.  A portion of the result report is included below for reference. Genetic testing reported out on 09/17/2022.       Genetic testing identified a variant of uncertain significance (VUS) in the POLE gene called p.A428T.  At this time, it is unknown if this variant is associated with an increased risk for cancer or if it is benign, but most uncertain variants are reclassified to benign. It should not be used to make medical management decisions. With time, we suspect the laboratory will determine the significance of this variant, if any. If the laboratory reclassifies this variant, we will attempt to contact Rachel Singleton to discuss it further.   Even though a pathogenic variant was not identified, possible explanations for the cancer in the family may include: There may be no hereditary risk for cancer in the family. The cancers in Rachel Singleton and/or her family may be due to other genetic or environmental factors. There may be a gene mutation in one of these genes that current testing methods cannot detect, but that chance is small. There could be another gene that has not yet been discovered, or that we have not yet tested, that is responsible for the cancer diagnoses in the family.  It is also possible there is a hereditary cause for the cancer in the family that Rachel Singleton did not inherit.  Therefore, it is important to remain in touch with cancer genetics in the future so that we can continue to offer Rachel Singleton the most up to date genetic testing.     ADDITIONAL GENETIC TESTING:  We discussed with Rachel Singleton that her genetic testing was fairly extensive.  If there are genes  identified to increase cancer risk that can be analyzed in the future, we would be happy to discuss and coordinate this testing at that time.    CANCER SCREENING RECOMMENDATIONS:  Rachel Singleton test result is considered negative (normal).  This means that we have not identified a hereditary cause for her personal and family history of cancer at this time.   An individual's cancer risk and medical management are not determined by genetic test results alone. Overall cancer risk assessment incorporates additional factors, including personal medical history, family history, and any available genetic information that may result in a personalized plan for cancer prevention and surveillance. Therefore, it is recommended she continue to follow the cancer management and screening guidelines provided by her oncology and primary healthcare provider.  RECOMMENDATIONS FOR FAMILY MEMBERS:   Since she did not inherit a mutation in a cancer predisposition gene included on this panel, her children could not have inherited a mutation from her in one of these genes. Individuals in this family might be at some increased risk of developing cancer, over the general population risk, due to the family history of cancer. We recommend women in this family have a yearly mammogram beginning at age  40, or 10 years younger than the earliest onset of cancer, an annual clinical breast exam, and perform monthly breast self-exams.   FOLLOW-UP:  Cancer genetics is a rapidly advancing field and it is possible that new genetic tests will be appropriate for her and/or her family members in the future. We encouraged her to remain in contact with cancer genetics on an annual basis so we can update her personal and family histories and let her know of advances in cancer genetics that may benefit this family.   Our contact number was provided. Rachel Singleton questions were answered to her satisfaction, and she knows she is welcome to call us at  anytime with additional questions or concerns.   Lalla Brothers, MS, [facility] Genetic Counselor [email or web address] (P) [phone]

## 2022-10-04 ENCOUNTER — Encounter: Payer: Self-pay | Admitting: Genetic Counselor

## 2022-10-08 ENCOUNTER — Other Ambulatory Visit: Payer: Self-pay

## 2022-10-10 ENCOUNTER — Telehealth: Payer: Self-pay | Admitting: Radiation Oncology

## 2022-10-10 NOTE — Telephone Encounter (Signed)
I spoke with the patient to let her know Dr. Mitzi Hansen reviewed her pathology results and does not recommend adjuvant radiation. She is glad to hear this as well. I encouraged her to contact us if needed for any further radiation concerns.

## 2022-10-14 ENCOUNTER — Telehealth: Payer: Self-pay | Admitting: *Deleted

## 2022-10-14 NOTE — Telephone Encounter (Signed)
Received call from Patient inquiring about her Oncotype score and if it had come back yet.  [employer] who stated that her report is being held until payment of test is complete.  Patient is self pay.  Called [employer] to let her know and gave her the number to call.

## 2022-10-15 ENCOUNTER — Ambulatory Visit: Payer: Self-pay | Admitting: Rehabilitation

## 2022-10-15 ENCOUNTER — Encounter (HOSPITAL_COMMUNITY): Payer: Self-pay

## 2022-10-15 ENCOUNTER — Other Ambulatory Visit: Payer: Self-pay | Admitting: *Deleted

## 2022-10-15 DIAGNOSIS — C50211 Malignant neoplasm of upper-inner quadrant of right female breast: Secondary | ICD-10-CM

## 2022-10-16 ENCOUNTER — Inpatient Hospital Stay (HOSPITAL_BASED_OUTPATIENT_CLINIC_OR_DEPARTMENT_OTHER): Payer: Self-pay | Admitting: Hematology & Oncology

## 2022-10-16 ENCOUNTER — Inpatient Hospital Stay: Payer: Self-pay | Attending: Hematology & Oncology

## 2022-10-16 ENCOUNTER — Encounter: Payer: Self-pay | Admitting: Hematology & Oncology

## 2022-10-16 VITALS — BP 106/73 | HR 82 | Temp 98.1°F | Resp 18 | Wt 137.5 lb

## 2022-10-16 DIAGNOSIS — Z170 Estrogen receptor positive status [ER+]: Secondary | ICD-10-CM | POA: Insufficient documentation

## 2022-10-16 DIAGNOSIS — C50011 Malignant neoplasm of nipple and areola, right female breast: Secondary | ICD-10-CM

## 2022-10-16 DIAGNOSIS — C50211 Malignant neoplasm of upper-inner quadrant of right female breast: Secondary | ICD-10-CM

## 2022-10-16 DIAGNOSIS — C50911 Malignant neoplasm of unspecified site of right female breast: Secondary | ICD-10-CM | POA: Insufficient documentation

## 2022-10-16 DIAGNOSIS — Z9013 Acquired absence of bilateral breasts and nipples: Secondary | ICD-10-CM | POA: Insufficient documentation

## 2022-10-16 LAB — COMPREHENSIVE METABOLIC PANEL
ALT: 12 U/L (ref 0–44)
AST: 12 U/L — ABNORMAL LOW (ref 15–41)
Albumin: 4.5 g/dL (ref 3.5–5.0)
Alkaline Phosphatase: 45 U/L (ref 38–126)
Anion gap: 10 (ref 5–15)
BUN: 16 mg/dL (ref 6–20)
CO2: 28 mmol/L (ref 22–32)
Calcium: 9.9 mg/dL (ref 8.9–10.3)
Chloride: 104 mmol/L (ref 98–111)
Creatinine, Ser: 0.74 mg/dL (ref 0.44–1.00)
GFR, Estimated: >60 mL/min (ref >60)
Glucose, Bld: 87 mg/dL (ref 70–99)
Potassium: 4 mmol/L (ref 3.5–5.1)
Sodium: 142 mmol/L (ref 135–145)
Total Bilirubin: 0.4 mg/dL (ref 0.3–1.2)
Total Protein: 7.7 g/dL (ref 6.5–8.1)

## 2022-10-16 LAB — CBC WITH DIFFERENTIAL/PLATELET
Abs Immature Granulocytes: 0.02 10*3/uL (ref 0.00–0.07)
Basophils Absolute: 0 10*3/uL (ref 0.0–0.1)
Basophils Relative: 1 %
Eosinophils Absolute: 0.1 10*3/uL (ref 0.0–0.5)
Eosinophils Relative: 1 %
HCT: 42.5 % (ref 36.0–46.0)
Hemoglobin: 13.9 g/dL (ref 12.0–15.0)
Immature Granulocytes: 0 %
Lymphocytes Relative: 14 %
Lymphs Abs: 1.1 10*3/uL (ref 0.7–4.0)
MCH: 29.4 pg (ref 26.0–34.0)
MCHC: 32.7 g/dL (ref 30.0–36.0)
MCV: 89.9 fL (ref 80.0–100.0)
Monocytes Absolute: 0.5 10*3/uL (ref 0.1–1.0)
Monocytes Relative: 6 %
Neutro Abs: 6 10*3/uL (ref 1.7–7.7)
Neutrophils Relative %: 78 %
Platelets: 259 10*3/uL (ref 150–400)
RBC: 4.73 MIL/uL (ref 3.87–5.11)
RDW: 13.2 % (ref 11.5–15.5)
WBC: 7.7 10*3/uL (ref 4.0–10.5)
nRBC: 0 % (ref 0.0–0.2)

## 2022-10-16 LAB — GENETIC SCREENING ORDER

## 2022-10-16 NOTE — Progress Notes (Signed)
Hematology and Oncology Follow Up Visit  Rachel Singleton [id number] 12-15-1980 42 y.o. 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  Rachel Singleton did have a bilateral mastectomies.  This was done on 09/17/2022.  Rachel Singleton currently is having implants put in.  Rachel Singleton is seen plastic surgery for this.  Rachel Singleton did well with the mastectomies.  The pathology report [id number]) does show a invasive poorly differentiated adenocarcinoma in the right breast.  This measures 2.5 x 1.8 x 1.7 cm.  The margins are free.  There is suspicion for lymphatic invasion.  Rachel Singleton had 2 lymph nodes that were removed and both were negative.  As such, Rachel Singleton would be considered a stage IIA (T2N0M0) invasive ductal carcinoma.  Rachel Singleton had Oncotype score.  This was high at 25.  I had a long talk with Rachel Singleton and Rachel Singleton.  I told Rachel that the standard of care would be for Rachel to have adjuvant chemotherapy.  I told Rachel that I am worried by the fact that this is a poorly differentiated cancer.  This means that the cancer tends to be more aggressive and can tend to spread more quickly.  There is also a suspicion of lymphatic invasion.  Rachel Singleton, however is very undecided about chemotherapy.  Rachel Singleton is on a lot of research about chemotherapy.  Rachel Singleton is not sure that this is what Rachel Singleton would like.  We are going to do a Signatera assay on Rachel.  This can help [place] determine further Rachel risk of recurrence.  I told Rachel that if Rachel Singleton did not wish to have chemotherapy, Rachel Singleton will definitely need tamoxifen.  Rachel Singleton is still premenopausal.  Rachel Singleton was warning about a oophorectomy.  I told Rachel that even with an oophorectomy Rachel Singleton was still need medication.  However, Rachel Singleton would not need tamoxifen, but Rachel Singleton would need to have an aromatase inhibitor.  Again, Rachel Singleton and Rachel Singleton are somewhat on  certain about their decision regarding adjuvant therapy.  I did offer Rachel the positive a second opinion.  This would certainly be reasonable if Rachel Singleton would like to have this done.  I would refer Rachel to Rachel Singleton out at [facility] if Rachel Singleton wishes to have a second opinion.  Rachel Singleton I think is by to see the plastic surgeon this week.  Rachel Singleton would have I think 1 more set of implants placed.  I know is a little uncomfortable for Rachel.  Rachel Singleton has had no problems with cough or shortness of breath.  Rachel Singleton has had no nausea or vomiting.  There is been no change in bowel or bladder habits.  Overall, I would say performance status is probably ECOG 0.  Medications:  Current Outpatient Medications:    Ascorbic Acid (VITAMIN C) 1000 MG tablet, Take 1,000 mg by mouth daily., Disp: , Rfl:    b complex vitamins capsule, Take 1 capsule by mouth daily., Disp: , Rfl:    magnesium 30 MG tablet, Take 30 mg by mouth 2 (two) times daily., Disp: , Rfl:    Turmeric 400 MG CAPS, Take by mouth., Disp: , Rfl:    Vitamin Mixture (VITAMIN E COMPLETE PO), Take by mouth., Disp: , Rfl:    WHEAT GERM OIL PO, Take by mouth., Disp: , Rfl:    Zinc 30 MG CAPS, Take by  mouth., Disp: , Rfl:   Allergies:  Allergies  Allergen Reactions   Aluminum-Containing Compounds Hives and Rash    Past Medical History, Surgical history, Social history, and Family History were reviewed and updated.  Review of Systems: Review of Systems  Constitutional: Negative.   HENT:  Negative.    Eyes: Negative.   Respiratory: Negative.    Cardiovascular: Negative.   Gastrointestinal: Negative.   Endocrine: Negative.   Genitourinary: Negative.    Musculoskeletal: Negative.   Skin: Negative.   Neurological: Negative.   Hematological: Negative.   Psychiatric/Behavioral: Negative.      Physical Exam:  weight is 137 lb 8 oz (62.4 kg). Rachel oral temperature is 98.1 F (36.7 C). Rachel blood pressure is 106/73 and Rachel pulse is 82. Rachel respiration is 18 and  oxygen saturation is 100%.   Wt Readings from Last 3 Encounters:  10/16/22 137 lb 8 oz (62.4 kg)  09/17/22 139 lb 15.9 oz (63.5 kg)  09/04/22 145 lb (65.8 kg)    Physical Exam Vitals reviewed.  Constitutional:      Comments: Rachel chest wall exam shows bilateral mastectomies.  Rachel Singleton has implants.  Rachel Singleton has well-healed and healed nplate surgical scar so far.  There is no axillary adenopathy.  HENT:     Head: Normocephalic and atraumatic.  Eyes:     Pupils: Pupils are equal, round, and reactive to light.  Cardiovascular:     Rate and Rhythm: Normal rate and regular rhythm.     Heart sounds: Normal heart sounds.  Pulmonary:     Effort: Pulmonary effort is normal.     Breath sounds: Normal breath sounds.  Abdominal:     General: Bowel sounds are normal.     Palpations: Abdomen is soft.  Musculoskeletal:        General: No tenderness or deformity. Normal range of motion.     Cervical back: Normal range of motion.  Lymphadenopathy:     Cervical: No cervical adenopathy.  Skin:    General: Skin is warm and dry.     Findings: No erythema or rash.  Neurological:     Mental Status: Rachel Singleton is alert and oriented to person, place, and time.  Psychiatric:        Behavior: Behavior normal.        Thought Content: Thought content normal.        Judgment: Judgment normal.      Lab Results  Component Value Date   WBC 7.7 10/16/2022   HGB 13.9 10/16/2022   HCT 42.5 10/16/2022   MCV 89.9 10/16/2022   PLT 259 10/16/2022     Chemistry      Component Value Date/Time   NA 142 10/16/2022 1003   K 4.0 10/16/2022 1003   CL 104 10/16/2022 1003   CO2 28 10/16/2022 1003   BUN 16 10/16/2022 1003   CREATININE 0.74 10/16/2022 1003   CREATININE 0.72 09/04/2022 1052      Component Value Date/Time   CALCIUM 9.9 10/16/2022 1003   ALKPHOS 45 10/16/2022 1003   AST 12 (L) 10/16/2022 1003   AST 10 (L) 09/04/2022 1052   ALT 12 10/16/2022 1003   ALT 8 09/04/2022 1052   BILITOT 0.4 10/16/2022 1003    BILITOT 0.4 09/04/2022 1052      Impression and Plan: Rachel Singleton is a very nice 42 year old premenopausal white female.  Rachel Singleton has a stage IIA invasive ductal carcinoma of the right breast.  Rachel Singleton underwent bilateral mastectomies.  The  tumor does have a high Oncotype score.  It is poorly differentiated.  There may be some lymphatic invasion.  Again, I truly believe that Rachel Singleton would benefit from adjuvant chemotherapy, in addition to hormonal therapy.  I told Rachel that I would use 4 cycles of Taxotere/Cytoxan.  I told Rachel that Rachel Singleton would lose Rachel hair.  However, would come back nicely.  I told Rachel that there would be no effect on Rachel heart which we will see with other protocols.  Again, Rachel Singleton is not certain about taking treatment.  Rachel Singleton has done research and has talked to other women.  We will see what the Signatera result shows.  This will be quite interesting.  I suppose that if there were circulating tumor cells, that Rachel Singleton would not need adjuvant chemotherapy.  I know this is still somewhat debatable.  We do see circulating tumor cells, then Rachel Singleton definitely is going to need chemotherapy in the adjuvant setting.  I do not see need for radiation therapy.  I know this is quite challenging for Rachel.  I know that Rachel Singleton has always has strong feelings toward a more natural and holistic approach.  Rachel Singleton asked me about infusional Vitamin C.  I told Rachel I had no problems with this if Rachel Singleton wanted to try this.  Again, I am told Rachel Singleton and Rachel Singleton as the standard of care clearly is adjuvant chemotherapy given Rachel Oncotype score.  We will see what the Signatera assay shows and then plan to get Rachel back and talk to Rachel some more.   Josph Macho, MD 7/31/20242:59 PM

## 2022-10-17 ENCOUNTER — Telehealth: Payer: Self-pay

## 2022-10-17 NOTE — Addendum Note (Signed)
Addended by: Arlan Organ R on: 10/17/2022 08:12 AM   Modules accepted: Orders

## 2022-10-17 NOTE — Telephone Encounter (Signed)
MD called

## 2022-10-17 NOTE — Telephone Encounter (Signed)
-----   Message from Josph Macho sent at 10/17/2022  8:04 AM EDT ----- Call - the tumor level is up just a little.  We need to evaluate this with a bone scan and liver u/s.  I will set these up!!  Cindee Lame

## 2022-10-21 ENCOUNTER — Encounter: Payer: Self-pay | Admitting: *Deleted

## 2022-10-21 ENCOUNTER — Ambulatory Visit (HOSPITAL_COMMUNITY)
Admission: RE | Admit: 2022-10-21 | Discharge: 2022-10-21 | Disposition: A | Discharge status: Home / Self Care | Payer: Self-pay | Source: Ambulatory Visit | Attending: Hematology & Oncology | Admitting: Hematology & Oncology

## 2022-10-21 DIAGNOSIS — Z170 Estrogen receptor positive status [ER+]: Secondary | ICD-10-CM | POA: Insufficient documentation

## 2022-10-21 DIAGNOSIS — C50011 Malignant neoplasm of nipple and areola, right female breast: Secondary | ICD-10-CM | POA: Insufficient documentation

## 2022-10-21 NOTE — Progress Notes (Signed)
Patient's oncotype returned elevated at 25. She is still unsure of whether or not she wants adjuvant chemotherapy and/or other treatment. She requests a Signatera to determine MRD status.   Her tumor marker is also elevated, so [place] and bone scan ordered. Will follow for results.   Oncology Nurse Navigator Documentation     10/21/2022   10:30 AM  Oncology Nurse Navigator Flowsheets  Navigator Follow Up Date: 10/22/2022  Navigator Follow Up Reason: Scan Review  Navigator Location [facility]  Navigator Encounter Type Appt/Treatment Plan Review  Patient Visit Type MedOnc  Treatment Phase Active Tx  Barriers/Navigation Needs Coordination of Care;Education  Interventions None Required  Acuity Level 2-Minimal Needs (1-2 Barriers Identified)  Support Groups/Services Friends and Family  Time Spent with Patient 15

## 2022-10-22 ENCOUNTER — Encounter: Payer: Self-pay | Admitting: *Deleted

## 2022-10-22 NOTE — Progress Notes (Signed)
Liver US normal. Will await Bone Scan results.   Oncology Nurse Navigator Documentation     10/22/2022    1:15 PM  Oncology Nurse Navigator Flowsheets  Navigator Follow Up Date: 10/31/2022  Navigator Follow Up Reason: Scan Review  Navigator Location [facility]  Navigator Encounter Type Scan Review  Patient Visit Type MedOnc  Treatment Phase Active Tx  Barriers/Navigation Needs Coordination of Care;Education  Interventions None Required  Acuity Level 2-Minimal Needs (1-2 Barriers Identified)  Support Groups/Services Friends and Family  Time Spent with Patient 15

## 2022-10-23 ENCOUNTER — Encounter: Payer: Self-pay | Admitting: *Deleted

## 2022-10-23 NOTE — Progress Notes (Signed)
Patient calling to make sure that her Signatera testing was in process. Explained to her that I had been in touch with our account rep earlier this week and she confirmed that testing was ordered correctly and in process. Results should be back in about 3 weeks.   Patient also asks about the financial cost of test and whether or not results would be available before total bill was paid. Reached back out to the account rep. This is the response which was shared with the patient:  "But our process is to send a Compassionate Care form along with the 1st statement of $395 (if paid within 30d). We HIGHLY encourage the pt to fill out the Comp Care, as that allows [place] to reduce the pricing and it will apply to ALL blood draws for a full year. Our approach has always been very pt-friendly, & we'll work with the pt on any plan that works for them (and that's if there even is an out pocket). I've yet to see a pt receive a bill in my 4y here, even with self pay pts, if that's any consolation!"  Oncology Nurse Navigator Documentation     10/23/2022    9:15 AM  Oncology Nurse Navigator Flowsheets  Navigator Follow Up Date: 10/31/2022  Navigator Follow Up Reason: Scan Review  Navigator Location [facility]  Navigator Encounter Type Telephone  Telephone Incoming Call  Patient Visit Type MedOnc  Treatment Phase Active Tx  Barriers/Navigation Needs Coordination of Care;Education  Education Other  Interventions Coordination of Care;Education  Acuity Level 2-Minimal Needs (1-2 Barriers Identified)  Coordination of Care Other  Education Method Verbal  Support Groups/Services Friends and Family  Time Spent with Patient 15

## 2022-10-30 ENCOUNTER — Inpatient Hospital Stay: Payer: Self-pay | Attending: Hematology & Oncology | Admitting: Hematology and Oncology

## 2022-10-30 ENCOUNTER — Encounter: Payer: Self-pay | Admitting: *Deleted

## 2022-10-30 ENCOUNTER — Other Ambulatory Visit: Payer: Self-pay

## 2022-10-30 VITALS — BP 113/69 | HR 88 | Temp 98.1°F | Resp 18 | Ht 67.0 in | Wt 132.1 lb

## 2022-10-30 DIAGNOSIS — Z5189 Encounter for other specified aftercare: Secondary | ICD-10-CM | POA: Insufficient documentation

## 2022-10-30 DIAGNOSIS — Z808 Family history of malignant neoplasm of other organs or systems: Secondary | ICD-10-CM | POA: Insufficient documentation

## 2022-10-30 DIAGNOSIS — Z5111 Encounter for antineoplastic chemotherapy: Secondary | ICD-10-CM | POA: Insufficient documentation

## 2022-10-30 DIAGNOSIS — Z800 Family history of malignant neoplasm of digestive organs: Secondary | ICD-10-CM | POA: Insufficient documentation

## 2022-10-30 DIAGNOSIS — C50011 Malignant neoplasm of nipple and areola, right female breast: Secondary | ICD-10-CM

## 2022-10-30 DIAGNOSIS — Z803 Family history of malignant neoplasm of breast: Secondary | ICD-10-CM | POA: Insufficient documentation

## 2022-10-30 DIAGNOSIS — Z9013 Acquired absence of bilateral breasts and nipples: Secondary | ICD-10-CM | POA: Insufficient documentation

## 2022-10-30 DIAGNOSIS — C50211 Malignant neoplasm of upper-inner quadrant of right female breast: Secondary | ICD-10-CM | POA: Insufficient documentation

## 2022-10-30 DIAGNOSIS — Z87891 Personal history of nicotine dependence: Secondary | ICD-10-CM | POA: Insufficient documentation

## 2022-10-30 DIAGNOSIS — Z170 Estrogen receptor positive status [ER+]: Secondary | ICD-10-CM | POA: Insufficient documentation

## 2022-10-30 NOTE — Progress Notes (Signed)
[facility] CONSULT NOTE  Patient Care Team: Waynard Reeds, MD as PCP - General (Obstetrics and Gynecology)  CHIEF COMPLAINTS/PURPOSE OF CONSULTATION:  Newly diagnosed breast cancer  HISTORY OF PRESENTING ILLNESS:  Rachel Singleton 42 y.o. female is here because of recent diagnosis of right breast cancer.  Patient underwent bilateral mastectomies.  She had Oncotype DX testing that came back as 25 and Dr. Myna Hidalgo recommended adjuvant chemotherapy.  She decided to come to [place] for treatment because of availability of Dignicap.  She is healing and recovering very well from the surgery.  I reviewed her records extensively and collaborated the history with the patient.  SUMMARY OF ONCOLOGIC HISTORY: Oncology History  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)  09/01/2022 Initial Diagnosis   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 Report date is 09/17/2022.  The CancerNext-Expanded gene panel offered by [facility] and includes sequencing, rearrangement, and RNA analysis for the following 71 genes: AIP, ALK, APC, ATM, AXIN2, BAP1, BARD1, BMPR1A, BRCA1, BRCA2, BRIP1, CDC73, CDH1, CDK4, CDKN1B, CDKN2A, CHEK2, CTNNA1, DICER1, FH, FLCN, KIF1B, LZTR1, MAX, MEN1, MET, MLH1, MSH2, MSH3, MSH6, MUTYH, NF1, NF2, NTHL1, PALB2, PHOX2B, PMS2, POT1, PRKAR1A, PTCH1, PTEN, RAD51C, RAD51D, RB1, RET, SDHA, SDHAF2, SDHB, SDHC, SDHD, SMAD4, SMARCA4, SMARCB1, SMARCE1, STK11,  SUFU, TMEM127, TP53, TSC1, TSC2, and VHL (sequencing and deletion/duplication); EGFR, EGLN1, HOXB13, KIT, MITF, PDGFRA, POLD1, and POLE (sequencing only); EPCAM and GREM1 (deletion/duplication only).    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 Oncotype DX score 25      MEDICAL HISTORY:  Past Medical History:  Diagnosis Date   Asthma    pregnancy only   Breast cancer (HCC) 08/21/2022   Former smoker 03/18/2006   Quit 2008   Hx of varicella     SURGICAL HISTORY: Past Surgical History:  Procedure Laterality Date   BREAST BIOPSY Right 08/21/2022   MM RT BREAST BX W LOC DEV 1ST LESION IMAGE BX SPEC STEREO GUIDE 08/21/2022 [facility]   BREAST BIOPSY Right 08/21/2022   MM RT BREAST BX W LOC DEV EA AD LESION IMG BX SPEC STEREO GUIDE 08/21/2022 [facility]   BREAST BIOPSY Right 08/21/2022   [place] RT BREAST BX W LOC DEV 1ST LESION IMG BX SPEC US GUIDE 08/21/2022 [facility]   BREAST RECONSTRUCTION WITH PLACEMENT OF TISSUE EXPANDER AND ALLODERM Bilateral 09/17/2022   Procedure: BREAST RECONSTRUCTION WITH PLACEMENT OF TISSUE EXPANDER AND ALLODERM;  Surgeon: Glenna Fellows, MD;  Location: [facility];  Service: Plastics;  Laterality: Bilateral;   MASTECTOMY W/ SENTINEL NODE BIOPSY Right 09/17/2022   Procedure: RIGHT MASTECTOMY WITH SENTINEL LYMPH NODE BIOPSY;  Surgeon: Manus Rudd, MD;  Location: [facility];  Service: General;  Laterality: Right;   SIMPLE MASTECTOMY WITH AXILLARY SENTINEL NODE BIOPSY Left 09/17/2022   Procedure: LEFT SIMPLE MASTECTOMY;  Surgeon: Manus Rudd, MD;  Location: [facility];  Service: General;  Laterality: Left;   WISDOM TOOTH EXTRACTION      SOCIAL HISTORY: Social History   Socioeconomic  History   Marital status: Married    Spouse name: Not on file   Number of children: Not on file   Years of education: Not on file   Highest education level: Not on file  Occupational History   Not on file  Tobacco Use   Smoking status: Never   Smokeless tobacco: Never  Vaping Use   Vaping status: Never Used  Substance and Sexual Activity   Alcohol use: No   Drug use: No   Sexual activity: Yes    Birth control/protection: None  Other Topics Concern   Not on file  Social History Narrative   Not on file   Social Determinants of Health   Financial Resource Strain: Not on file  Food Insecurity: No Food Insecurity (09/04/2022)   Hunger Vital Sign    Worried About Running Out of Food in the Last Year: Never true    Ran Out of Food in the Last Year: Never true  Transportation Needs: No Transportation Needs (09/04/2022)   PRAPARE - Administrator, Civil Service (Medical): No    Lack of Transportation (Non-Medical): No  Physical Activity: Not on file  Stress: Not on file  Social Connections: Unknown (07/31/2021)   Received from [facility]   Social Network    Social Network: Not on file  Intimate Partner Violence: Not At Risk (09/04/2022)   Humiliation, Afraid, Rape, and Kick questionnaire    Fear of Current or Ex-Partner: No    Emotionally Abused: No    Physically Abused: No    Sexually Abused: No    FAMILY HISTORY: Family History  Problem Relation Age of Onset   Breast cancer Mother 49       DCIS (-/-)   Heart disease Father    Heart attack Father    Cystic fibrosis Brother    Skin cancer Maternal Aunt 28 - 69   Breast cancer Paternal Aunt 61 - 69   Colon cancer Maternal Grandfather 101 - 75   Breast cancer Other 50 - 59    ALLERGIES:  is allergic to aluminum-containing compounds.  MEDICATIONS:  Current Outpatient Medications  Medication Sig Dispense Refill   Ascorbic Acid (VITAMIN C) 1000 MG tablet Take 1,000 mg by mouth daily.     b complex  vitamins capsule Take 1 capsule by mouth daily.     magnesium 30 MG tablet Take 30 mg by mouth 2 (two) times daily.     Turmeric 400 MG CAPS Take by mouth.     Vitamin Mixture (VITAMIN E COMPLETE PO) Take by mouth.     WHEAT GERM OIL PO Take by mouth.     Zinc 30 MG CAPS Take by mouth.     No current facility-administered medications for this visit.    REVIEW OF SYSTEMS:   Constitutional: Denies fevers, chills or abnormal night sweats  All other systems were reviewed with the patient and are negative.  PHYSICAL EXAMINATION: ECOG PERFORMANCE STATUS: 1 - Symptomatic but completely ambulatory  Vitals:   10/30/22 1148  BP: 113/69  Pulse: 88  Resp: 18  Temp: 98.1 F (36.7 C)  SpO2: 99%   Filed Weights   10/30/22 1148  Weight: 132 lb 1.6 oz (59.9 kg)    GENERAL:alert, no distress and comfortable  LABORATORY  DATA:  I have reviewed the data as listed Lab Results  Component Value Date   WBC 7.7 10/16/2022   HGB 13.9 10/16/2022   HCT 42.5 10/16/2022   MCV 89.9 10/16/2022   PLT 259 10/16/2022   Lab Results  Component Value Date   NA 142 10/16/2022   K 4.0 10/16/2022   CL 104 10/16/2022   CO2 28 10/16/2022    RADIOGRAPHIC STUDIES: I have personally reviewed the radiological reports and agreed with the findings in the report.  ASSESSMENT AND PLAN:  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 Oncotype DX score 25  Pathology and radiology counseling: Discussed with the patient, the details of pathology including the type of breast cancer,the clinical staging, the significance of ER, PR and HER-2/neu receptors and the implications for treatment. After reviewing the pathology in detail, we proceeded to  discuss the different treatment options radiation, chemotherapy, antiestrogen therapies.  Treatment plan: Adjuvant chemotherapy with Taxotere and Cytoxan every 3 weeks x 4 I also discussed offset clinical trial where there is randomization between complete estrogen blockade to chemotherapy Adjuvant antiestrogen therapy with complete estrogen blockade  Patient will inform us tomorrow of her decision whether or not she wants to persuade the clinical trial. She wanted to get treatment.  Our office because we have Dignicap. She was provided more information regarding Dignicap. Once she makes a decision regarding chemo she will need port placement.  Return to clinic to start chemotherapy   All questions were answered. The patient knows to call the clinic with any problems, questions or concerns.    Tamsen Meek, MD 10/30/22

## 2022-10-30 NOTE — Assessment & Plan Note (Signed)
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 Oncotype DX score 25  Pathology and radiology counseling: Discussed with the patient, the details of pathology including the type of breast cancer,the clinical staging, the significance of ER, PR and HER-2/neu receptors and the implications for treatment. After reviewing the pathology in detail, we proceeded to discuss the different treatment options radiation, chemotherapy, antiestrogen therapies.  Treatment plan: Adjuvant chemotherapy with Taxotere and Cytoxan every 3 weeks x 4 I also discussed offset clinical trial where there is randomization between complete estrogen blockade to chemotherapy Adjuvant antiestrogen therapy with complete estrogen blockade  Patient will inform us tomorrow of her decision whether or not she wants to persuade the clinical trial. She wanted to get treatment.  Our office because we have Dignicap. She was provided more information regarding Dignicap. Once she makes a decision regarding chemo she will need port placement.  Return to clinic to start chemotherapy

## 2022-10-30 NOTE — Research (Signed)
[id number]: A Phase III Adjuvant Trial Evaluating the Addition of Adjuvant Chemotherapy to Ovarian Function Suppression plus Endocrine Therapy in Premenopausal Patients with pN0-1, ER-Positive/HER2-Negative Breast Cancer and an Oncotype Recurrence Score ? 25 (OFSET)   Patient Rachel Singleton was identified by Dr. Pamelia Hoit as a potential candidate for the above listed study.  This Clinical Research Nurse met with HINA GRISSOM, [id number], on 10/30/22 in a manner and location that ensures patient privacy to discuss participation in the above listed research study.  Patient is Unaccompanied.  A copy of the informed consent document and separate HIPAA Authorization was provided to the patient.  Patient reads, speaks, and understands [place].   Patient was provided with the business card of this Nurse and encouraged to contact the research team with any questions.  Approximately 30 minutes were spent with the patient reviewing the informed consent documents.  Patient was provided the option of taking informed consent documents home to review and was encouraged to review at their convenience with their support network, including other care providers. Patient took the consent documents home to review. The pt said that her Oncotype score is 25.  She said that she is leaning towards chemotherapy.  She said that she wants to know that she has done everything that she can to avoid her cancer returning.  The pt was told that this study is a randomized trial, and her doctor cannot decide her treatment arm.  The pt was told that if she wants chemo then she needs to receive her treatment off study since she could be randomized to the arm that does not include chemotherapy.  The nurse will follow up with the pt tomorrow to see if she has made her decision about study participation or receiving her treatment off study.  The pt thanked the nurse for providing the study information, and she was appreciative of her appointment at  the [facility].   Janan Ridge RN, BSN, CCRP Clinical Research Nurse Lead 10/30/2022 1:28 PM

## 2022-10-30 NOTE — Progress Notes (Signed)
Received a call from the patient. After much thought, she has decided to proceed with chemotherapy. She does want to use the dignicap during treatments. She understands that this means she will need to transfer her care to [facility]. She is okay with me placing the transfer request, and if possible she prefers Dr Pamelia Hoit.   Dr Myna Hidalgo made aware of patient plan and confirms patient's need to transfer all care to [facility]. Referral order placed. Reached out to the breast navigation team and notified them of patient preferences and plan.   Will discontinue navigation at this time as patient will be seeking care elsewhere.   Oncology Nurse Navigator Documentation     10/30/2022   10:15 AM  Oncology Nurse Navigator Flowsheets  Navigation Complete Date: 10/30/2022  Post Navigation: Continue to Follow Patient? No  Reason Not Navigating Patient: Seeking Care elsewhere  Navigator Location [facility]  Navigator Encounter Type Telephone  Patient Visit Type MedOnc  Treatment Phase Active Tx  Barriers/Navigation Needs Coordination of Care;Education  Education Other  Interventions Coordination of Care;Education;Referrals  Acuity Level 2-Minimal Needs (1-2 Barriers Identified)  Referrals Medical Oncology  Coordination of Care Other  Education Method Verbal  Support Groups/Services Friends and Family  Time Spent with Patient 30

## 2022-10-31 ENCOUNTER — Ambulatory Visit (HOSPITAL_COMMUNITY): Payer: Self-pay

## 2022-10-31 ENCOUNTER — Encounter: Payer: Self-pay | Admitting: Hematology and Oncology

## 2022-10-31 ENCOUNTER — Telehealth: Payer: Self-pay | Admitting: Hematology and Oncology

## 2022-10-31 ENCOUNTER — Ambulatory Visit: Payer: Self-pay | Admitting: Surgery

## 2022-10-31 ENCOUNTER — Telehealth: Payer: Self-pay | Admitting: *Deleted

## 2022-10-31 ENCOUNTER — Other Ambulatory Visit: Payer: Self-pay | Admitting: Pharmacist

## 2022-10-31 ENCOUNTER — Other Ambulatory Visit: Payer: Self-pay | Admitting: Hematology and Oncology

## 2022-10-31 DIAGNOSIS — C50211 Malignant neoplasm of upper-inner quadrant of right female breast: Secondary | ICD-10-CM

## 2022-10-31 MED ORDER — LIDOCAINE-PRILOCAINE 2.5-2.5 % EX CREA
TOPICAL_CREAM | CUTANEOUS | 3 refills | Status: DC
Start: 2022-10-31 — End: 2023-02-11

## 2022-10-31 MED ORDER — PROCHLORPERAZINE MALEATE 10 MG PO TABS: 10.0000 mg | ORAL_TABLET | Freq: Four times a day (QID) | ORAL | 1 refills | Status: DC | PRN

## 2022-10-31 MED ORDER — ONDANSETRON HCL 8 MG PO TABS: 8.0000 mg | ORAL_TABLET | Freq: Three times a day (TID) | ORAL | 1 refills | Status: DC | PRN

## 2022-10-31 MED ORDER — DEXAMETHASONE 4 MG PO TABS
ORAL_TABLET | ORAL | 0 refills | Status: DC
Start: 2022-10-31 — End: 2023-02-11

## 2022-10-31 NOTE — Telephone Encounter (Signed)
Spoke with patient. She states she would like to move forward with chemo. Her port is scheduled for 8/22 and I informed her we would try to start chemo 8/23. Schedule message placed for chemo edu. She will also be using dignicap which she has already ordered.

## 2022-10-31 NOTE — H&P (View-Only) (Signed)
 Rachel Singleton is an 41 y.o. female.   Chief Complaint: Breast cancer HPI: This is a healthy 42 year old female with a family history of breast cancer in her mother in her 33's who presented recently with a palpable mass in the right upper inner quadrant. No previous mammograms. She underwent diagnostic mammograms and ultrasound that showed a 3 cm mass in the right breast at 2:00 sitting on the pectoralis muscle. She also has diffuse calcifications bilaterally - R>L. She underwent biopsy of two areas of calcifications on the right, as well as the right breast mass. 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   On 09/17/22, she underwent bilateral mastectomies and R SLNB.  The margins and SLNB were negative, but her Oncotype was 25.  Chemotherapy is recommended.  Past Medical History:  Diagnosis Date   Asthma    pregnancy only   Breast cancer (HCC) 08/21/2022   Former smoker 03/18/2006   Quit 2008   Hx of varicella     Past Surgical History:  Procedure Laterality Date   BREAST BIOPSY Right 08/21/2022   MM RT BREAST BX W LOC DEV 1ST LESION IMAGE BX SPEC STEREO GUIDE 08/21/2022 [facility]   BREAST BIOPSY Right 08/21/2022   MM RT BREAST BX W LOC DEV EA AD LESION IMG BX SPEC STEREO GUIDE 08/21/2022 [facility]   BREAST BIOPSY Right 08/21/2022   [place] RT BREAST BX W LOC DEV 1ST LESION IMG BX SPEC US GUIDE 08/21/2022 [facility]   BREAST RECONSTRUCTION WITH PLACEMENT OF TISSUE EXPANDER AND ALLODERM Bilateral 09/17/2022   Procedure: BREAST RECONSTRUCTION WITH PLACEMENT OF TISSUE EXPANDER AND ALLODERM;  Surgeon: Glenna Fellows, MD;  Location: [facility];  Service: Plastics;  Laterality: Bilateral;   MASTECTOMY W/ SENTINEL NODE BIOPSY Right 09/17/2022   Procedure: RIGHT MASTECTOMY WITH SENTINEL LYMPH NODE BIOPSY;  Surgeon: Manus Rudd, MD;  Location: [facility];  Service: General;   Laterality: Right;   SIMPLE MASTECTOMY WITH AXILLARY SENTINEL NODE BIOPSY Left 09/17/2022   Procedure: LEFT SIMPLE MASTECTOMY;  Surgeon: Manus Rudd, MD;  Location: [facility];  Service: General;  Laterality: Left;   WISDOM TOOTH EXTRACTION      Family History  Problem Relation Age of Onset   Breast cancer Mother 80       DCIS (-/-)   Heart disease Father    Heart attack Father    Cystic fibrosis Brother    Skin cancer Maternal Aunt 7 - 69   Breast cancer Paternal Aunt 40 - 69   Colon cancer Maternal Grandfather 64 - 75   Breast cancer Other 76 - 32   Social History:  reports that she has never smoked. She has never used smokeless tobacco. She reports that she does not drink alcohol and does not use drugs.  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    Ultrasound-guided port placement.  The surgical procedure has been discussed with the patient.  Potential risks, benefits, alternative treatments, and expected outcomes have been explained.  All of the patient's questions at this time have been answered.  The likelihood of reaching the patient's treatment goal is good.  The patient understand the proposed surgical  procedure and wishes to proceed.   [name], MD 10/31/2022, 7:23 AM

## 2022-10-31 NOTE — Telephone Encounter (Signed)
 Left patient a message in regards to scheduled appointment times/dates

## 2022-10-31 NOTE — Telephone Encounter (Signed)
[id number]: A Phase III Adjuvant Trial Evaluating the Addition of Adjuvant Chemotherapy to Ovarian Function Suppression plus Endocrine Therapy in Premenopausal Patients with pN0-1, ER-Positive/HER2-Negative Breast Cancer and an Oncotype Recurrence Score ? 25 (OFSET)   This nurse called and spoke to the pt this morning.  The pt said that she has decided that she wants chemotherapy as part of her treatment plan.  The nurse said that she would not be a good fit for the study if she is not comfortable with being randomized to a non-chemo treatment arm.  The pt was thanked for her consideration of the study.  The pt said that she is ready to proceed with her chemotherapy.  The nurse informed Dr. Pamelia Hoit and Angie Fava, breast navigator, about the pt's decision to not participate in the study.  The breast navigator will proceed with getting her scheduled for her port and chemo education class.    Janan Ridge RN, BSN, CCRP Clinical Research Nurse Lead 10/31/2022 1:29 PM

## 2022-10-31 NOTE — H&P (Signed)
Rachel Singleton is an 42 y.o. female.   Chief Complaint: Breast cancer HPI: This is a healthy 42 year old female with a family history of breast cancer in her mother in her 33's who presented recently with a palpable mass in the right upper inner quadrant. No previous mammograms. She underwent diagnostic mammograms and ultrasound that showed a 3 cm mass in the right breast at 2:00 sitting on the pectoralis muscle. She also has diffuse calcifications bilaterally - R>L. She underwent biopsy of two areas of calcifications on the right, as well as the right breast mass. 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   On 09/17/22, she underwent bilateral mastectomies and R SLNB.  The margins and SLNB were negative, but her Oncotype was 25.  Chemotherapy is recommended.  Past Medical History:  Diagnosis Date   Asthma    pregnancy only   Breast cancer (HCC) 08/21/2022   Former smoker 03/18/2006   Quit 2008   Hx of varicella     Past Surgical History:  Procedure Laterality Date   BREAST BIOPSY Right 08/21/2022   MM RT BREAST BX W LOC DEV 1ST LESION IMAGE BX SPEC STEREO GUIDE 08/21/2022 [facility]   BREAST BIOPSY Right 08/21/2022   MM RT BREAST BX W LOC DEV EA AD LESION IMG BX SPEC STEREO GUIDE 08/21/2022 [facility]   BREAST BIOPSY Right 08/21/2022   [place] RT BREAST BX W LOC DEV 1ST LESION IMG BX SPEC US GUIDE 08/21/2022 [facility]   BREAST RECONSTRUCTION WITH PLACEMENT OF TISSUE EXPANDER AND ALLODERM Bilateral 09/17/2022   Procedure: BREAST RECONSTRUCTION WITH PLACEMENT OF TISSUE EXPANDER AND ALLODERM;  Surgeon: Glenna Fellows, MD;  Location: [facility];  Service: Plastics;  Laterality: Bilateral;   MASTECTOMY W/ SENTINEL NODE BIOPSY Right 09/17/2022   Procedure: RIGHT MASTECTOMY WITH SENTINEL LYMPH NODE BIOPSY;  Surgeon: Manus Rudd, MD;  Location: [facility];  Service: General;   Laterality: Right;   SIMPLE MASTECTOMY WITH AXILLARY SENTINEL NODE BIOPSY Left 09/17/2022   Procedure: LEFT SIMPLE MASTECTOMY;  Surgeon: Manus Rudd, MD;  Location: [facility];  Service: General;  Laterality: Left;   WISDOM TOOTH EXTRACTION      Family History  Problem Relation Age of Onset   Breast cancer Mother 80       DCIS (-/-)   Heart disease Father    Heart attack Father    Cystic fibrosis Brother    Skin cancer Maternal Aunt 7 - 69   Breast cancer Paternal Aunt 40 - 69   Colon cancer Maternal Grandfather 64 - 75   Breast cancer Other 76 - 32   Social History:  reports that she has never smoked. She has never used smokeless tobacco. She reports that she does not drink alcohol and does not use drugs.  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    Ultrasound-guided port placement.  The surgical procedure has been discussed with the patient.  Potential risks, benefits, alternative treatments, and expected outcomes have been explained.  All of the patient's questions at this time have been answered.  The likelihood of reaching the patient's treatment goal is good.  The patient understand the proposed surgical  procedure and wishes to proceed.   [name], MD 10/31/2022, 7:23 AM

## 2022-10-31 NOTE — Telephone Encounter (Signed)
DCP-001: Use of a Clinical Trial Screening Tool to Address Cancer Health Disparities in the [employer] [facility])   Introduced this study to the pt. RN explained that it is a one data collection study.  The nurse will meet with pt during her next visit and give her the consent form to read.  The pt was in agreement to review it.    Janan Ridge RN, BSN, CCRP Clinical Research Nurse Lead 10/31/2022 2:24 PM

## 2022-10-31 NOTE — Progress Notes (Signed)
START ON PATHWAY REGIMEN - Breast     A cycle is every 21 days:     Cyclophosphamide      Docetaxel   **Always confirm dose/schedule in your pharmacy ordering system**  Patient Characteristics: Postoperative without Neoadjuvant Therapy, M0 (Pathologic Staging), Invasive Disease, Adjuvant Therapy, HER2 Negative, ER Positive, Node Negative, pT1a, pN39mi or pT1b-c, pN0/N61mi or pT2 or Higher, pN0, Oncotype Intermediate Risk (16-25), Premenopausal,  Chemotherapy Preferred Therapeutic Status: Postoperative without Neoadjuvant Therapy, M0 (Pathologic Staging) AJCC Grade: G3 AJCC N Category: pN0 AJCC M Category: cM0 ER Status: Positive (+) AJCC 8 Stage Grouping: IIA HER2 Status: Negative (-) Oncotype Dx Recurrence Score: 25 AJCC T Category: pT2 PR Status: Negative (-) Has this patient completed genomic testing<= Yes - Oncotype DX(R) Menopausal Status: Premenopausal Treatment Preferred: Chemotherapy Intent of Therapy: Curative Intent, Discussed with Patient

## 2022-11-01 NOTE — Progress Notes (Addendum)
COVID Vaccine Completed: no  Date of COVID positive in last 90 days: no  PCP - Waynard Reeds, MD Cardiologist - n/a  Chest x-ray -  EKG - n/a Stress Test - n/a ECHO -  n/a Cardiac Cath - n/a Pacemaker/ICD device last checked: n/a Spinal Cord Stimulator: n/a  Bowel Prep - no  Sleep Study - n/a CPAP -   Fasting Blood Sugar - n/a Checks Blood Sugar _____ times a day  Last dose of GLP1 agonist-  N/A GLP1 instructions:  N/A   Last dose of SGLT-2 inhibitors-  N/A SGLT-2 instructions: N/A   Blood Thinner Instructions: n/a Aspirin Instructions: Last Dose:  Activity level: Can go up a flight of stairs and perform activities of daily living without stopping and without symptoms of chest pain or shortness of breath.   Anesthesia review:   Patient denies shortness of breath, fever, cough and chest pain at PAT appointment  Patient verbalized understanding of instructions that were given to them at the PAT appointment. Patient was also instructed that they will need to review over the PAT instructions again at home before surgery.

## 2022-11-02 ENCOUNTER — Other Ambulatory Visit: Payer: Self-pay

## 2022-11-04 ENCOUNTER — Encounter: Payer: Self-pay | Admitting: *Deleted

## 2022-11-04 ENCOUNTER — Other Ambulatory Visit: Payer: Self-pay

## 2022-11-04 ENCOUNTER — Encounter (HOSPITAL_COMMUNITY)
Admission: RE | Admit: 2022-11-04 | Discharge: 2022-11-04 | Disposition: A | Discharge status: Home / Self Care | Payer: Self-pay | Source: Ambulatory Visit | Attending: Surgery | Admitting: Surgery

## 2022-11-04 ENCOUNTER — Encounter (HOSPITAL_COMMUNITY): Payer: Self-pay

## 2022-11-04 VITALS — BP 106/69 | HR 79 | Temp 98.7°F | Ht 67.0 in | Wt 131.0 lb

## 2022-11-04 DIAGNOSIS — Z01818 Encounter for other preprocedural examination: Secondary | ICD-10-CM

## 2022-11-04 NOTE — Patient Instructions (Addendum)
SURGICAL WAITING ROOM VISITATION  Patients having surgery or a procedure may have no more than 2 support people in the waiting area - these visitors may rotate.    Children under the age of 63 must have an adult with them who is not the patient.  Due to an increase in RSV and influenza rates and associated hospitalizations, children ages 63 and under may not visit patients in [facility] hospitals.  If the patient needs to stay at the hospital during part of their recovery, the visitor guidelines for inpatient rooms apply. Pre-op nurse will coordinate an appropriate time for 1 support person to accompany patient in pre-op.  This support person may not rotate.    Please refer to the [facility] website for the visitor guidelines for Inpatients (after your surgery is over and you are in a regular room).    Your procedure is scheduled on: 11/07/22   Report to [facility] Main Entrance    Report to admitting at 5:15 AM   Call this number if you have problems the morning of surgery [phone]   Do not eat food :After Midnight.   After Midnight you may have the following liquids until 4:30 AM DAY OF SURGERY  Water Non-Citrus Juices (without pulp, NO RED-Apple, White grape, White cranberry) Black Coffee (NO MILK/CREAM OR CREAMERS, sugar ok)  Clear Tea (NO MILK/CREAM OR CREAMERS, sugar ok) regular and decaf                             Plain Jell-O (NO RED)                                           Fruit ices (not with fruit pulp, NO RED)                                     Popsicles (NO RED)                                                               Sports drinks like Gatorade (NO RED)                      If you have questions, please contact your surgeon's office.   FOLLOW BOWEL PREP AND ANY ADDITIONAL PRE OP INSTRUCTIONS YOU RECEIVED FROM YOUR SURGEON'S OFFICE!!!     Oral Hygiene is also important to reduce your risk of infection.                                     Remember - BRUSH YOUR TEETH THE MORNING OF SURGERY WITH YOUR REGULAR TOOTHPASTE  DENTURES WILL BE REMOVED PRIOR TO SURGERY PLEASE DO NOT APPLY "Poly grip" OR ADHESIVES!!!   Stop all vitamins and herbal supplements 7 days before surgery.   Take these medicines the morning of surgery with A SIP OF WATER: None              You  may not have any metal on your body including hair pins, jewelry, and body piercing             Do not wear make-up, lotions, powders, perfumes, or deodorant  Do not wear nail polish including gel and S&S, artificial/acrylic nails, or any other type of covering on natural nails including finger and toenails. If you have artificial nails, gel coating, etc. that needs to be removed by a nail salon please have this removed prior to surgery or surgery may need to be canceled/ delayed if the surgeon/ anesthesia feels like they are unable to be safely monitored.   Do not shave  48 hours prior to surgery.    Do not bring valuables to the hospital. [facility] IS NOT             RESPONSIBLE   FOR VALUABLES.   Contacts, glasses, dentures or bridgework may not be worn into surgery.   DO NOT BRING YOUR HOME MEDICATIONS TO THE HOSPITAL. PHARMACY WILL DISPENSE MEDICATIONS LISTED ON YOUR MEDICATION LIST TO YOU DURING YOUR ADMISSION IN THE HOSPITAL!    Patients discharged on the day of surgery will not be allowed to drive home.  Someone NEEDS to stay with you for the first 24 hours after anesthesia.              Please read over the following fact sheets you were given: IF YOU HAVE QUESTIONS ABOUT YOUR PRE-OP INSTRUCTIONS PLEASE CALL [phone] [place]ras   If you received a COVID test during your pre-op visit  it is requested that you wear a mask when out in public, stay away from anyone that may not be feeling well and notify your surgeon if you develop symptoms. If you test positive for Covid or have been in contact with anyone that has tested positive in the last 10 days please  notify you surgeon.    [facility] - Preparing for Surgery Before surgery, you can play an important role.  Because skin is not sterile, your skin needs to be as free of germs as possible.  You can reduce the number of germs on your skin by washing with CHG (chlorahexidine gluconate) soap before surgery.  CHG is an antiseptic cleaner which kills germs and bonds with the skin to continue killing germs even after washing. Please DO NOT use if you have an allergy to CHG or antibacterial soaps.  If your skin becomes reddened/irritated stop using the CHG and inform your nurse when you arrive at Short Stay. Do not shave (including legs and underarms) for at least 48 hours prior to the first CHG shower.  You may shave your face/neck.  Please follow these instructions carefully:  1.  Shower with CHG Soap the night before surgery and the  morning of surgery.  2.  If you choose to wash your hair, wash your hair first as usual with your normal  shampoo.  3.  After you shampoo, rinse your hair and body thoroughly to remove the shampoo.                             4.  Use CHG as you would any other liquid soap.  You can apply chg directly to the skin and wash.  Gently with a scrungie or clean washcloth.  5.  Apply the CHG Soap to your body ONLY FROM THE NECK DOWN.   Do   not use on face/  open                           Wound or open sores. Avoid contact with eyes, ears mouth and   genitals (private parts).                       Wash face,  Genitals (private parts) with your normal soap.             6.  Wash thoroughly, paying special attention to the area where your    surgery  will be performed.  7.  Thoroughly rinse your body with warm water from the neck down.  8.  DO NOT shower/wash with your normal soap after using and rinsing off the CHG Soap.                9.  Pat yourself dry with a clean towel.            10.  Wear clean pajamas.            11.  Place clean sheets on your bed the night of your first  shower and do not  sleep with pets. Day of Surgery : Do not apply any lotions/deodorants the morning of surgery.  Please wear clean clothes to the hospital/surgery center.  FAILURE TO FOLLOW THESE INSTRUCTIONS MAY RESULT IN THE CANCELLATION OF YOUR SURGERY  PATIENT SIGNATURE_________________________________  NURSE SIGNATURE__________________________________  ________________________________________________________________________

## 2022-11-06 ENCOUNTER — Encounter: Payer: Self-pay | Admitting: *Deleted

## 2022-11-06 NOTE — Anesthesia Preprocedure Evaluation (Addendum)
Anesthesia Evaluation  Patient identified by MRN, date of birth, ID band Patient awake    Reviewed: Allergy & Precautions, NPO status , Patient's Chart, lab work & pertinent test results  History of Anesthesia Complications Negative for: history of anesthetic complications  Airway Mallampati: II  TM Distance: >3 FB Neck ROM: Full    Dental  (+) Dental Advisory Given   Pulmonary asthma , former smoker   breath sounds clear to auscultation       Cardiovascular negative cardio ROS  Rhythm:Regular Rate:Normal     Neuro/Psych negative neurological ROS     GI/Hepatic negative GI ROS, Neg liver ROS,,,  Endo/Other  negative endocrine ROS    Renal/GU negative Renal ROS     Musculoskeletal   Abdominal   Peds  Hematology negative hematology ROS (+)   Anesthesia Other Findings Breast cancer  Reproductive/Obstetrics                             Anesthesia Physical Anesthesia Plan  ASA: 2  Anesthesia Plan: General   Post-op Pain Management: Tylenol PO (pre-op)*   Induction: Intravenous  PONV Risk Score and Plan: 3 and Ondansetron, Dexamethasone and Scopolamine patch - Pre-op  Airway Management Planned: LMA  Additional Equipment: None  Intra-op Plan:   Post-operative Plan:   Informed Consent: I have reviewed the patients History and Physical, chart, labs and discussed the procedure including the risks, benefits and alternatives for the proposed anesthesia with the patient or authorized representative who has indicated his/her understanding and acceptance.     Dental advisory given  Plan Discussed with: CRNA and Surgeon  Anesthesia Plan Comments:         Anesthesia Quick Evaluation

## 2022-11-07 ENCOUNTER — Ambulatory Visit (HOSPITAL_COMMUNITY): Payer: Self-pay

## 2022-11-07 ENCOUNTER — Ambulatory Visit (HOSPITAL_COMMUNITY)
Admission: RE | Admit: 2022-11-07 | Discharge: 2022-11-07 | Disposition: A | Discharge status: Home / Self Care | Payer: Self-pay | Source: Ambulatory Visit | Attending: Surgery | Admitting: Surgery

## 2022-11-07 ENCOUNTER — Other Ambulatory Visit: Payer: Self-pay

## 2022-11-07 ENCOUNTER — Encounter: Payer: Self-pay | Admitting: Hematology and Oncology

## 2022-11-07 ENCOUNTER — Inpatient Hospital Stay: Payer: Self-pay

## 2022-11-07 ENCOUNTER — Inpatient Hospital Stay: Payer: Self-pay | Admitting: Pharmacist

## 2022-11-07 ENCOUNTER — Encounter: Payer: Self-pay | Admitting: *Deleted

## 2022-11-07 ENCOUNTER — Ambulatory Visit (HOSPITAL_BASED_OUTPATIENT_CLINIC_OR_DEPARTMENT_OTHER): Payer: Self-pay | Admitting: Anesthesiology

## 2022-11-07 ENCOUNTER — Ambulatory Visit (HOSPITAL_COMMUNITY): Payer: Self-pay | Admitting: Anesthesiology

## 2022-11-07 ENCOUNTER — Encounter (HOSPITAL_COMMUNITY)
Admission: RE | Disposition: A | Discharge status: Home / Self Care | Payer: Self-pay | Source: Ambulatory Visit | Attending: Surgery

## 2022-11-07 ENCOUNTER — Encounter (HOSPITAL_COMMUNITY): Payer: Self-pay | Admitting: Surgery

## 2022-11-07 VITALS — BP 97/64 | HR 63 | Temp 98.0°F | Resp 18 | Wt 132.3 lb

## 2022-11-07 DIAGNOSIS — Z170 Estrogen receptor positive status [ER+]: Secondary | ICD-10-CM | POA: Insufficient documentation

## 2022-11-07 DIAGNOSIS — J45909 Unspecified asthma, uncomplicated: Secondary | ICD-10-CM | POA: Insufficient documentation

## 2022-11-07 DIAGNOSIS — Z87891 Personal history of nicotine dependence: Secondary | ICD-10-CM | POA: Insufficient documentation

## 2022-11-07 DIAGNOSIS — Z803 Family history of malignant neoplasm of breast: Secondary | ICD-10-CM | POA: Insufficient documentation

## 2022-11-07 DIAGNOSIS — C50211 Malignant neoplasm of upper-inner quadrant of right female breast: Secondary | ICD-10-CM

## 2022-11-07 DIAGNOSIS — C50911 Malignant neoplasm of unspecified site of right female breast: Secondary | ICD-10-CM

## 2022-11-07 DIAGNOSIS — Z9013 Acquired absence of bilateral breasts and nipples: Secondary | ICD-10-CM | POA: Insufficient documentation

## 2022-11-07 DIAGNOSIS — Z01818 Encounter for other preprocedural examination: Secondary | ICD-10-CM

## 2022-11-07 DIAGNOSIS — C50011 Malignant neoplasm of nipple and areola, right female breast: Secondary | ICD-10-CM

## 2022-11-07 HISTORY — PX: PORTACATH PLACEMENT: SHX2246

## 2022-11-07 LAB — CBC
HCT: 39.4 % (ref 36.0–46.0)
Hemoglobin: 12.8 g/dL (ref 12.0–15.0)
MCH: 29.5 pg (ref 26.0–34.0)
MCHC: 32.5 g/dL (ref 30.0–36.0)
MCV: 90.8 fL (ref 80.0–100.0)
Platelets: 216 10*3/uL (ref 150–400)
RBC: 4.34 MIL/uL (ref 3.87–5.11)
RDW: 13.3 % (ref 11.5–15.5)
WBC: 7.7 10*3/uL (ref 4.0–10.5)
nRBC: 0 % (ref 0.0–0.2)

## 2022-11-07 LAB — POCT PREGNANCY, URINE: Preg Test, Ur: NEGATIVE

## 2022-11-07 SURGERY — INSERTION, TUNNELED CENTRAL VENOUS DEVICE, WITH PORT
Anesthesia: General

## 2022-11-07 MED ORDER — BUPIVACAINE HCL 0.25 % IJ SOLN
INTRAMUSCULAR | Status: DC | PRN
  Administered 2022-11-07: 8 mL

## 2022-11-07 MED ORDER — LIDOCAINE HCL (CARDIAC) PF 100 MG/5ML IV SOSY
PREFILLED_SYRINGE | INTRAVENOUS | Status: DC | PRN
  Administered 2022-11-07: 20 mg via INTRAVENOUS

## 2022-11-07 MED ORDER — HEPARIN SOD (PORK) LOCK FLUSH 100 UNIT/ML IV SOLN
INTRAVENOUS | Status: AC
  Filled 2022-11-07: qty 5

## 2022-11-07 MED ORDER — SCOPOLAMINE 1 MG/3DAYS TD PT72
1.0000 | MEDICATED_PATCH | TRANSDERMAL | Status: DC
  Administered 2022-11-07: 1.5 mg via TRANSDERMAL
  Filled 2022-11-07: qty 1

## 2022-11-07 MED ORDER — DEXAMETHASONE SODIUM PHOSPHATE 4 MG/ML IJ SOLN
INTRAMUSCULAR | Status: DC | PRN
  Administered 2022-11-07: 4 mg via INTRAVENOUS

## 2022-11-07 MED ORDER — GLYCOPYRROLATE 0.2 MG/ML IJ SOLN
INTRAMUSCULAR | Status: DC | PRN
  Administered 2022-11-07 (×2): .1 mg via INTRAVENOUS

## 2022-11-07 MED ORDER — HEPARIN SOD (PORK) LOCK FLUSH 100 UNIT/ML IV SOLN
INTRAVENOUS | Status: DC | PRN
  Administered 2022-11-07: 500 [IU] via INTRAVENOUS

## 2022-11-07 MED ORDER — GLYCOPYRROLATE 0.2 MG/ML IJ SOLN
INTRAMUSCULAR | Status: AC
  Filled 2022-11-07: qty 1

## 2022-11-07 MED ORDER — PROMETHAZINE HCL 25 MG/ML IJ SOLN: 6.2500 mg | INTRAMUSCULAR | Status: DC | PRN

## 2022-11-07 MED ORDER — ORAL CARE MOUTH RINSE: 15.0000 mL | Freq: Once | OROMUCOSAL | Status: AC

## 2022-11-07 MED ORDER — FENTANYL CITRATE (PF) 100 MCG/2ML IJ SOLN
INTRAMUSCULAR | Status: DC | PRN
  Administered 2022-11-07 (×2): 50 ug via INTRAVENOUS

## 2022-11-07 MED ORDER — ONDANSETRON HCL 4 MG/2ML IJ SOLN
INTRAMUSCULAR | Status: DC | PRN
Start: 2022-11-07 — End: 2022-11-07
  Administered 2022-11-07: 4 mg via INTRAVENOUS

## 2022-11-07 MED ORDER — MEPERIDINE HCL 50 MG/ML IJ SOLN: 6.2500 mg | INTRAMUSCULAR | Status: DC | PRN

## 2022-11-07 MED ORDER — PROPOFOL 10 MG/ML IV BOLUS
INTRAVENOUS | Status: AC
  Filled 2022-11-07: qty 20

## 2022-11-07 MED ORDER — ACETAMINOPHEN 500 MG PO TABS
1000.0000 mg | ORAL_TABLET | Freq: Once | ORAL | Status: AC
  Administered 2022-11-07: 1000 mg via ORAL

## 2022-11-07 MED ORDER — PHENYLEPHRINE 80 MCG/ML (10ML) SYRINGE FOR IV PUSH (FOR BLOOD PRESSURE SUPPORT)
PREFILLED_SYRINGE | INTRAVENOUS | Status: AC
  Filled 2022-11-07: qty 10

## 2022-11-07 MED ORDER — PROPOFOL 10 MG/ML IV BOLUS
INTRAVENOUS | Status: DC | PRN
  Administered 2022-11-07: 100 mg via INTRAVENOUS

## 2022-11-07 MED ORDER — HEPARIN 6000 UNIT IRRIGATION SOLUTION
Freq: Once | Status: AC
  Administered 2022-11-07: 1
  Filled 2022-11-07: qty 6000

## 2022-11-07 MED ORDER — OXYCODONE HCL 5 MG/5ML PO SOLN: 5.0000 mg | Freq: Once | ORAL | Status: DC | PRN

## 2022-11-07 MED ORDER — DEXAMETHASONE SODIUM PHOSPHATE 10 MG/ML IJ SOLN
INTRAMUSCULAR | Status: AC
  Filled 2022-11-07: qty 1

## 2022-11-07 MED ORDER — FENTANYL CITRATE (PF) 100 MCG/2ML IJ SOLN
INTRAMUSCULAR | Status: AC
  Filled 2022-11-07: qty 2

## 2022-11-07 MED ORDER — 0.9 % SODIUM CHLORIDE (POUR BTL) OPTIME
TOPICAL | Status: DC | PRN
Start: 2022-11-07 — End: 2022-11-07
  Administered 2022-11-07: 1000 mL

## 2022-11-07 MED ORDER — BUPIVACAINE HCL (PF) 0.25 % IJ SOLN
INTRAMUSCULAR | Status: AC
  Filled 2022-11-07: qty 30

## 2022-11-07 MED ORDER — FENTANYL CITRATE PF 50 MCG/ML IJ SOSY
PREFILLED_SYRINGE | INTRAMUSCULAR | Status: AC
  Filled 2022-11-07: qty 1

## 2022-11-07 MED ORDER — FENTANYL CITRATE PF 50 MCG/ML IJ SOSY
25.0000 ug | PREFILLED_SYRINGE | INTRAMUSCULAR | Status: DC | PRN
  Administered 2022-11-07: 50 ug via INTRAVENOUS

## 2022-11-07 MED ORDER — MIDAZOLAM HCL 2 MG/2ML IJ SOLN
INTRAMUSCULAR | Status: AC
  Filled 2022-11-07: qty 2

## 2022-11-07 MED ORDER — MIDAZOLAM HCL 5 MG/5ML IJ SOLN
INTRAMUSCULAR | Status: DC | PRN
  Administered 2022-11-07: 2 mg via INTRAVENOUS

## 2022-11-07 MED ORDER — CHLORHEXIDINE GLUCONATE 0.12 % MT SOLN
15.0000 mL | Freq: Once | OROMUCOSAL | Status: AC
  Administered 2022-11-07: 15 mL via OROMUCOSAL

## 2022-11-07 MED ORDER — ACETAMINOPHEN 500 MG PO TABS
1000.0000 mg | ORAL_TABLET | ORAL | Status: DC
  Filled 2022-11-07: qty 2

## 2022-11-07 MED ORDER — PHENYLEPHRINE HCL (PRESSORS) 10 MG/ML IV SOLN
INTRAVENOUS | Status: DC | PRN
  Administered 2022-11-07 (×3): 80 ug via INTRAVENOUS

## 2022-11-07 MED ORDER — CHLORHEXIDINE GLUCONATE CLOTH 2 % EX PADS: 6.0000 | MEDICATED_PAD | Freq: Once | CUTANEOUS | Status: DC

## 2022-11-07 MED ORDER — LACTATED RINGERS IV SOLN: INTRAVENOUS | Status: DC

## 2022-11-07 MED ORDER — OXYCODONE HCL 5 MG PO TABS: 5.0000 mg | ORAL_TABLET | Freq: Once | ORAL | Status: DC | PRN

## 2022-11-07 MED ORDER — MIDAZOLAM HCL 2 MG/2ML IJ SOLN: 0.5000 mg | Freq: Once | INTRAMUSCULAR | Status: DC | PRN

## 2022-11-07 MED ORDER — ONDANSETRON HCL 4 MG/2ML IJ SOLN
INTRAMUSCULAR | Status: AC
  Filled 2022-11-07: qty 2

## 2022-11-07 MED ORDER — CEFAZOLIN SODIUM-DEXTROSE 2-4 GM/100ML-% IV SOLN
2.0000 g | INTRAVENOUS | Status: AC
  Administered 2022-11-07: 2 g via INTRAVENOUS
  Filled 2022-11-07: qty 100

## 2022-11-07 MED FILL — Dexamethasone Sodium Phosphate Inj 100 MG/10ML: INTRAMUSCULAR | Qty: 1 | Status: AC

## 2022-11-07 SURGICAL SUPPLY — 42 items
APL SKNCLS STERI-STRIP NONHPOA (GAUZE/BANDAGES/DRESSINGS) ×1
BAG COUNTER SPONGE SURGICOUNT (BAG) IMPLANT
BAG DECANTER FOR FLEXI CONT (MISCELLANEOUS) ×1 IMPLANT
BAG SPNG CNTER NS LX DISP (BAG)
BENZOIN TINCTURE PRP APPL 2/3 (GAUZE/BANDAGES/DRESSINGS) ×1 IMPLANT
BLADE SURG 15 STRL LF DISP TIS (BLADE) ×1 IMPLANT
BLADE SURG 15 STRL SS (BLADE) ×1
BLADE SURG SZ11 CARB STEEL (BLADE) ×1 IMPLANT
COVER SURGICAL LIGHT HANDLE (MISCELLANEOUS) ×1 IMPLANT
DRAPE C-ARM 42X120 X-RAY (DRAPES) ×1 IMPLANT
DRAPE LAPAROSCOPIC ABDOMINAL (DRAPES) ×1 IMPLANT
DRAPE UTILITY XL STRL (DRAPES) ×1 IMPLANT
DRSG TEGADERM 2-3/8X2-3/4 SM (GAUZE/BANDAGES/DRESSINGS) ×1 IMPLANT
DRSG TEGADERM 4X4.75 (GAUZE/BANDAGES/DRESSINGS) ×1 IMPLANT
ELECT REM PT RETURN 15FT ADLT (MISCELLANEOUS) ×1 IMPLANT
GAUZE 4X4 16PLY ~~LOC~~+RFID DBL (SPONGE) ×1 IMPLANT
GAUZE SPONGE 2X2 8PLY STRL LF (GAUZE/BANDAGES/DRESSINGS) ×1 IMPLANT
GLOVE BIO SURGEON STRL SZ7 (GLOVE) ×1 IMPLANT
GLOVE BIOGEL PI IND STRL 7.5 (GLOVE) ×1 IMPLANT
GOWN STRL REUS W/ TWL LRG LVL3 (GOWN DISPOSABLE) ×1 IMPLANT
GOWN STRL REUS W/TWL LRG LVL3 (GOWN DISPOSABLE) ×1
KIT BASIN OR (CUSTOM PROCEDURE TRAY) ×1 IMPLANT
KIT PORT POWER 8FR ISP CVUE (Port) IMPLANT
KIT TURNOVER KIT A (KITS) IMPLANT
NDL HYPO 25X1 1.5 SAFETY (NEEDLE) ×1 IMPLANT
NEEDLE HYPO 25X1 1.5 SAFETY (NEEDLE) ×1
NS IRRIG 1000ML POUR BTL (IV SOLUTION) ×1 IMPLANT
PACK BASIC VI WITH GOWN DISP (CUSTOM PROCEDURE TRAY) ×1 IMPLANT
PENCIL SMOKE EVACUATOR (MISCELLANEOUS) ×1 IMPLANT
STRIP CLOSURE SKIN 1/2X4 (GAUZE/BANDAGES/DRESSINGS) ×1 IMPLANT
SUT MNCRL AB 4-0 PS2 18 (SUTURE) ×1 IMPLANT
SUT PROLENE 2 0 SH DA (SUTURE) ×1 IMPLANT
SUT SILK 2 0 (SUTURE)
SUT SILK 2-0 30XBRD TIE 12 (SUTURE) IMPLANT
SUT VIC AB 3-0 SH 27 (SUTURE) ×1
SUT VIC AB 3-0 SH 27XBRD (SUTURE) ×1 IMPLANT
SYR 10ML LL (SYRINGE) ×1 IMPLANT
SYR BULB IRRIG 60ML STRL (SYRINGE) IMPLANT
SYR CONTROL 10ML LL (SYRINGE) ×1 IMPLANT
TAPE STRIPS DRAPE STRL (GAUZE/BANDAGES/DRESSINGS) IMPLANT
TOWEL OR 17X26 10 PK STRL BLUE (TOWEL DISPOSABLE) ×1 IMPLANT
WATER STERILE IRR 1000ML POUR (IV SOLUTION) IMPLANT

## 2022-11-07 NOTE — Interval H&P Note (Signed)
History and Physical Interval Note:  11/07/2022 6:54 AM  Rachel Singleton  has presented today for surgery, with the diagnosis of BREAST CANCER.  The various methods of treatment have been discussed with the patient and family. After consideration of risks, benefits and other options for treatment, the patient has consented to  Procedure(s) with comments: ULTRASOUND-GUIDED PORT PLACEMENT (N/A) - LMA as a surgical intervention.  The patient's history has been reviewed, patient examined, no change in status, stable for surgery.  I have reviewed the patient's chart and labs.  Questions were answered to the patient's satisfaction.     Rachel Singleton

## 2022-11-07 NOTE — Anesthesia Procedure Notes (Signed)
Procedure Name: LMA Insertion Date/Time: 11/07/2022 7:30 AM  Performed by: Ahmed Prima, CRNAPre-anesthesia Checklist: Patient identified, Emergency Drugs available, Suction available and Patient being monitored Patient Re-evaluated:Patient Re-evaluated prior to induction Oxygen Delivery Method: Circle System Utilized Preoxygenation: Pre-oxygenation with 100% oxygen Induction Type: IV induction Ventilation: Mask ventilation without difficulty LMA: LMA inserted LMA Size: 3.0 Number of attempts: 1 Airway Equipment and Method: Bite block Placement Confirmation: positive ETCO2 Tube secured with: Tape Dental Injury: Teeth and Oropharynx as per pre-operative assessment

## 2022-11-07 NOTE — Anesthesia Postprocedure Evaluation (Signed)
Anesthesia Post Note  Patient: Rachel Singleton  Procedure(s) Performed: ULTRASOUND-GUIDED PORT PLACEMENT     Patient location during evaluation: Phase II Anesthesia Type: General Level of consciousness: awake and alert, patient cooperative and oriented Pain management: pain level controlled Vital Signs Assessment: post-procedure vital signs reviewed and stable Respiratory status: spontaneous breathing, nonlabored ventilation and respiratory function stable Cardiovascular status: blood pressure returned to baseline and stable Postop Assessment: no apparent nausea or vomiting and able to ambulate Anesthetic complications: no   No notable events documented.  Last Vitals:  Vitals:   11/07/22 0915 11/07/22 0920  BP: 103/66 (!) 141/96  Pulse: 66 66  Resp: 14 14  Temp:  36.5 C  SpO2: 100% 99%    Last Pain:  Vitals:   11/07/22 0920  TempSrc:   PainSc: 2                  Francessca Friis,E. Praneel Haisley

## 2022-11-07 NOTE — Discharge Instructions (Signed)
    PORT-A-CATH: POST OP INSTRUCTIONS  Always review your discharge instruction sheet given to you by the facility where your surgery was performed.   A prescription for pain medication may be given to you upon discharge. Take your pain medication as prescribed, if needed. If narcotic pain medicine is not needed, then you make take acetaminophen (Tylenol) or ibuprofen (Advil) as needed.  Take your usually prescribed medications unless otherwise directed. If you need a refill on your pain medication, please contact our office. All narcotic pain medicine now requires a paper prescription.  Phoned in and fax refills are no longer allowed by law.  Prescriptions will not be filled after 5 pm or on weekends.  You should follow a light diet for the remainder of the day after your procedure. Most patients will experience some mild swelling and/or bruising in the area of the incision. It may take several days to resolve. It is common to experience some constipation if taking pain medication after surgery. Increasing fluid intake and taking a stool softener (such as Colace) will usually help or prevent this problem from occurring. A mild laxative (Milk of Magnesia or Miralax) should be taken according to package directions if there are no bowel movements after 48 hours.  Unless discharge instructions indicate otherwise, you may remove your bandages 48 hours after surgery, and you may shower at that time. You may have steri-strips (small white skin tapes) in place directly over the incision.  These strips should be left on the skin for 7-10 days.   If your port is left accessed at the end of surgery (needle left in port), the dressing cannot get wet and should only by changed by a healthcare professional. When the port is no longer accessed (when the needle has been removed), follow step 7.   ACTIVITIES:  Limit activity involving your arms for the next 72 hours. Do no strenuous exercise or activity for 1 week.  You may drive when you are no longer taking prescription pain medication, you can comfortably wear a seatbelt, and you can maneuver your car. 10.You may need to see your doctor in the office for a follow-up appointment.  Please       check with your doctor.  11.When you receive a new Port-a-Cath, you will get a product guide and        ID card.  Please keep them in case you need them.  WHEN TO CALL YOUR DOCTOR ([phone]): Fever over 101.0 Chills Continued bleeding from incision Increased redness and tenderness at the site Shortness of breath, difficulty breathing   The clinic staff is available to answer your questions during regular business hours. Please don't hesitate to call and ask to speak to one of the nurses or medical assistants for clinical concerns. If you have a medical emergency, go to the nearest emergency room or call 911.  A surgeon from [facility] is always on call at the hospital.         

## 2022-11-07 NOTE — Transfer of Care (Signed)
Immediate Anesthesia Transfer of Care Note  Patient: Rachel Singleton  Procedure(s) Performed: ULTRASOUND-GUIDED PORT PLACEMENT  Patient Location: PACU  Anesthesia Type:General  Level of Consciousness: drowsy  Airway & Oxygen Therapy: Patient Spontanous Breathing  Post-op Assessment: Report given to RN and Post -op Vital signs reviewed and stable  Post vital signs: Reviewed and stable  Last Vitals:  Vitals Value Taken Time  BP 97/71 11/07/22 0831  Temp    Pulse 63 11/07/22 0832  Resp 11 11/07/22 0832  SpO2 100 % 11/07/22 0832  Vitals shown include unfiled device data.  Last Pain:  Vitals:   11/07/22 0550  TempSrc:   PainSc: 0-No pain      Patients Stated Pain Goal: 6 (11/07/22 0550)  Complications: No notable events documented.

## 2022-11-07 NOTE — Research (Signed)
DCP-001: Use of a Clinical Trial Screening Tool to Address Cancer Health Disparities in the [facility] Oncology Research Program [facility])  Patient Rachel Singleton was identified by Dr. Pamelia Hoit as a potential candidate for the above listed study.  This Clinical Research Nurse met with Rachel Singleton, [id number], on 11/07/22 in a manner and location that ensures patient privacy to discuss participation in the above listed research study.  Patient is Unaccompanied.  A copy of the informed consent document and separate HIPAA Authorization was provided to the patient.  Patient reads, speaks, and understands [place].   Patient was provided with the business card of this Nurse and encouraged to contact the research team with any questions.  Approximately 10 minutes were spent with the patient reviewing the informed consent documents.  Patient was provided the option of taking informed consent documents home to review and was encouraged to review at their convenience with their support network, including other care providers. Patient took the consent documents home to review. The nurse will follow up with the pt tomorrow to see if she has any questions. Janan Ridge RN, BSN, CCRP Clinical Research Nurse Lead 11/07/2022 4:16 PM

## 2022-11-07 NOTE — Progress Notes (Signed)
Called pt to introduce myself as her Dance movement psychotherapist and to discuss the [employer].  I went over what it covers and gave her the income requirement but she exceeds that amount so unfortunately she doesn't qualify for the grant at this time,

## 2022-11-07 NOTE — Op Note (Signed)
Preop diagnosis: Right breast cancer Postop diagnosis: Same Procedure performed: Ultrasound guided right internal jugular vein port placement Surgeon:Kasyn Stouffer K Selita Staiger Anesthesia: General via LMA Indications: This is a healthy 42 year old female with a family history of breast cancer in her mother in her 11's who presented recently with a palpable mass in the right upper inner quadrant. No previous mammograms. She underwent diagnostic mammograms and ultrasound that showed a 3 cm mass in the right breast at 2:00 sitting on the pectoralis muscle. She also has diffuse calcifications bilaterally - R>L. She underwent biopsy of two areas of calcifications on the right, as well as the right breast mass. 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    On 09/17/22, she underwent bilateral mastectomies and R SLNB.  The margins and SLNB were negative, but her Oncotype was 25.  Chemotherapy is recommended.  She presents now for port placement.   Description of procedure: The patient is brought to the operating room placed in the supine position on the operating table.  After an adequate level of general anesthesia was obtained, the patient right arm was tucked at her side.  Her right chest and neck were prepped with ChloraPrep and draped sterile fashion.  A timeout was taken to ensure the proper patient and proper procedure.  She was placed in Trendelenburg position.  We interrogated her neck with the ultrasound.  The jugular vein is easily identified.  Using ultrasound guidance we directly cannulated the internal jugular vein with good blood return.  The wire passed into the superior vena cava.  Fluoroscopy confirmed that the wire headed down the right side of the mediastinum.  The needle was removed.  We created a subcutaneous pocket below the right clavicle.  We first anesthetized with local anesthetic.  We created a subcutaneous tunnel from the  subcutaneous pocket to the insertion site on the neck.  An 8 French [place] port was assembled and was tunneled from the subcutaneous pocket to the insertion site.  The catheter was cut to the appropriate length using fluoroscopic guidance.  Using fluoroscopic guidance, we passed the dilator and breakaway sheath over the wire.  The wire and dilator were removed.  The catheter was then advanced through the sheath which was removed.  Fluoroscopy confirmed that there were no kinks along the length of the catheter.  We are able to aspirate blood easily through the port and were able to flush easily.  The port was secured with two interrupted 2-0 Prolene sutures.  3-0 Vicryl was used to close the subcutaneous tissue and 4-0 Monocryl was used to close the skin at both sites.  Benzoin and Steri-Strips were applied.    The port was left accessed for chemotherapy tomorrow.  We instilled concentrated heparin solution.  An occlusive dressing was placed.  The patient was then extubated and brought to the recovery room in stable condition.  All sponge, instrument, and needle counts are correct.  Post-op chest x-ray is pending.     Rachel Singleton. Rachel Skains, MD, [facility]  General Surgery   11/07/2022 8:26 AM

## 2022-11-07 NOTE — Progress Notes (Signed)
[facility]       Telephone: [phone]?Fax: [phone]   Oncology Clinical Pharmacist Practitioner Initial Assessment  Rachel Singleton is a 42 y.o. female with a diagnosis of breast cancer. They were contacted today via in-person visit.  Indication/Regimen Docetaxel (Taxotere) and cyclophosphamide (Cytoxan) are being used appropriately for treatment of breast cancer by Dr. Serena Croissant.      Wt Readings from Last 1 Encounters:  11/07/22 132 lb 4.8 oz (60 kg)    Estimated body surface area is 1.68 meters squared as calculated from the following:   Height as of an earlier encounter on 11/07/22: 5\' 7"  (1.702 m).   Weight as of this encounter: 132 lb 4.8 oz (60 kg).  The dosing regimen cycle is every 21 days x 4 cycles  Docetaxel (75 mg/m2) on Day 1 Cyclophosphamide (600 mg/m2) on Day 1 Pegfilgrastim (6 mg) on Day 3  It is planned to continue until treatment plan completion or unacceptable toxicity. The tentative start date is: 11/08/22  Dose Modifications None   Allergies Allergies  Allergen Reactions   Aluminum-Containing Compounds Hives and Rash    Vitals    11/07/2022    4:08 PM 11/07/2022    9:20 AM 11/07/2022    9:15 AM  Oncology Vitals  Weight 60.011 kg    Weight (lbs) 132 lbs 5 oz    BMI 20.72 kg/m2    Temp 98 F (36.7 C) 97.7 F (36.5 C)   Pulse Rate 63 66 66  BP 97/64 141/96 103/66  Resp 18 14 14   SpO2 100 % 99 % 100 %  BSA (m2) 1.68 m2       Laboratory Data    Latest Ref Rng & Units 11/07/2022    9:48 AM 10/16/2022   10:03 AM 09/04/2022   10:52 AM  CBC EXTENDED  WBC 4.0 - 10.5 K/uL 7.7  7.7  6.9   RBC 3.87 - 5.11 MIL/uL 4.34  4.73  4.37   Hemoglobin 12.0 - 15.0 g/dL 29.5  62.1  30.8   HCT 36.0 - 46.0 % 39.4  42.5  38.9   Platelets 150 - 400 K/uL 216  259  216   NEUT# 1.7 - 7.7 K/uL  6.0  4.6   Lymph# 0.7 - 4.0 K/uL  1.1  1.5        Latest Ref Rng & Units 10/16/2022   10:03 AM 09/04/2022   10:52 AM 02/01/2013    4:15 AM   CMP  Glucose 70 - 99 mg/dL 87  97  98   BUN 6 - 20 mg/dL 16  20  10    Creatinine 0.44 - 1.00 mg/dL 6.57  8.46  9.62   Sodium 135 - 145 mmol/L 142  140  142   Potassium 3.5 - 5.1 mmol/L 4.0  4.0  3.9   Chloride 98 - 111 mmol/L 104  105  106   CO2 22 - 32 mmol/L 28  29  26    Calcium 8.9 - 10.3 mg/dL 9.9  9.1  9.2   Total Protein 6.5 - 8.1 g/dL 7.7  6.5    Total Bilirubin 0.3 - 1.2 mg/dL 0.4  0.4    Alkaline Phos 38 - 126 U/L 45  28    AST 15 - 41 U/L 12  10    ALT 0 - 44 U/L 12  8     Contraindications Contraindications were reviewed? Yes Contraindications to therapy were identified? No  Safety Precautions The following safety precautions were reviewed:  Fever: reviewed the importance of having a thermometer and the Centers for Disease Control and Prevention (CDC) definition of fever which is 100.8F (38C) or higher. Patient should call 24/7 triage at [phone] if experiencing a fever or any other symptoms Decreased white blood cells (WBCs) and increased risk for infection Decreased platelet count and increased risk of bleeding Decreased hemoglobin, part of the red blood cells that carry iron and oxygen Hair Loss Fatigue Fluid retention or swelling (edema) Peripheral Neuropathy Mouth sores Rash or itchy skin Muscle or joint pain or weakness Nausea or vomiting Diarrhea Nail Changes Hypersensitivity reactions Secondary Malignancies Hemorrhagic cystitis Pneumonitis Handling body fluids and waste Intimacy, sexual activity, contraception, fertility  Medication Reconciliation Current Outpatient Medications  Medication Sig Dispense Refill   Ascorbic Acid (VITAMIN C) 1000 MG tablet Take 1,000 mg by mouth daily.     b complex vitamins capsule Take 1 capsule by mouth daily.     Calcium Citrate-Vitamin D (CALCIUM + D PO) Take 1 tablet by mouth daily.     dexamethasone (DECADRON) 4 MG tablet Take 1 tablet day before chemo and 1 tablet day after chemo with food 8 tablet 0    lidocaine-prilocaine (EMLA) cream Apply to affected area once 30 g 3   magnesium 30 MG tablet Take 30 mg by mouth 2 (two) times daily.     ondansetron (ZOFRAN) 8 MG tablet Take 1 tablet (8 mg total) by mouth every 8 (eight) hours as needed for nausea or vomiting. Start on the third day after chemotherapy. 30 tablet 1   OVER THE COUNTER MEDICATION Take 1 capsule by mouth daily. EstroCleanse     OVER THE COUNTER MEDICATION Take 1 capsule by mouth daily. Sulforaphane     prochlorperazine (COMPAZINE) 10 MG tablet Take 1 tablet (10 mg total) by mouth every 6 (six) hours as needed for nausea or vomiting. 30 tablet 1   QUERCETIN PO Take 1 tablet by mouth daily.     Turmeric 400 MG CAPS Take 400 mg by mouth daily.     Zinc 30 MG CAPS Take 30 mg by mouth daily.     No current facility-administered medications for this visit.   Medication reconciliation is based on the patient's most recent medication list in the electronic medical record (EMR) including herbal products and OTC medications.   The patient's medication list was reviewed today with the patient? Yes   Drug-drug interactions (DDIs) DDIs were evaluated? Yes Significant DDIs identified? No   Drug-Food Interactions Drug-food interactions were evaluated? Yes Drug-food interactions identified? No   Follow-up Plan  Treatment start date: 11/08/22 Port placement date: 11/07/22 We reviewed the prescriptions, premedications, and treatment regimen with the patient. Possible side effects of the treatment regimen were reviewed and management strategies were discussed.  Can use loperamide as needed for diarrhea, loratadine as needed for G-CSF bone pain, and Senna-S as needed for constipation.  We reviewed the trial Ambrosone CB, et al. Dietary Supplement Use During Chemotherapy and Survival Outcomes of Patients With Breast Cancer Enrolled in a Cooperative Group Clinical Trial (SWOG S0221). J Clin Oncol. 2020 Mar 10;38(8):804-814. A written copy was  given to the patient. She reported doing vitamin C infusions currently. She is interested in DigniCap and compression for docetaxel related toxicities Clinical pharmacy will assist Dr. Serena Croissant and Forde Dandy on an as needed basis going forward  Forde Dandy participated in the discussion, expressed understanding, and voiced  agreement with the above plan. All questions were answered to her satisfaction. The patient was advised to contact the clinic at [phone] with any questions or concerns prior to her return visit.   I spent 60 minutes assessing the patient.  Slyvester Latona A. Odetta Pink, PharmD, BCOP, CPP  Anselm Lis, RPH-CPP, 11/07/2022 5:06 PM  **Disclaimer: This note was dictated with voice recognition software. Similar sounding words can inadvertently be transcribed and this note may contain transcription errors which may not have been corrected upon publication of note.**

## 2022-11-08 ENCOUNTER — Encounter (HOSPITAL_COMMUNITY): Payer: Self-pay | Admitting: Surgery

## 2022-11-08 ENCOUNTER — Inpatient Hospital Stay: Payer: Self-pay

## 2022-11-08 VITALS — BP 112/68 | HR 62 | Temp 97.7°F | Resp 14 | Wt 133.0 lb

## 2022-11-08 DIAGNOSIS — C50211 Malignant neoplasm of upper-inner quadrant of right female breast: Secondary | ICD-10-CM

## 2022-11-08 DIAGNOSIS — Z95828 Presence of other vascular implants and grafts: Secondary | ICD-10-CM

## 2022-11-08 HISTORY — DX: Presence of other vascular implants and grafts: Z95.828

## 2022-11-08 LAB — CBC WITH DIFFERENTIAL (CANCER CENTER ONLY)
Abs Immature Granulocytes: 0.03 10*3/uL (ref 0.00–0.07)
Basophils Absolute: 0 10*3/uL (ref 0.0–0.1)
Basophils Relative: 1 %
Eosinophils Absolute: 0 10*3/uL (ref 0.0–0.5)
Eosinophils Relative: 1 %
HCT: 36.3 % (ref 36.0–46.0)
Hemoglobin: 12.3 g/dL (ref 12.0–15.0)
Immature Granulocytes: 0 %
Lymphocytes Relative: 18 %
Lymphs Abs: 1.6 10*3/uL (ref 0.7–4.0)
MCH: 29.9 pg (ref 26.0–34.0)
MCHC: 33.9 g/dL (ref 30.0–36.0)
MCV: 88.1 fL (ref 80.0–100.0)
Monocytes Absolute: 0.7 10*3/uL (ref 0.1–1.0)
Monocytes Relative: 8 %
Neutro Abs: 6.4 10*3/uL (ref 1.7–7.7)
Neutrophils Relative %: 72 %
Platelet Count: 218 10*3/uL (ref 150–400)
RBC: 4.12 MIL/uL (ref 3.87–5.11)
RDW: 13.3 % (ref 11.5–15.5)
WBC Count: 8.9 10*3/uL (ref 4.0–10.5)
nRBC: 0 % (ref 0.0–0.2)

## 2022-11-08 LAB — CMP (CANCER CENTER ONLY)
ALT: 12 U/L (ref 0–44)
AST: 8 U/L — ABNORMAL LOW (ref 15–41)
Albumin: 4.2 g/dL (ref 3.5–5.0)
Alkaline Phosphatase: 36 U/L — ABNORMAL LOW (ref 38–126)
Anion gap: 6 (ref 5–15)
BUN: 10 mg/dL (ref 6–20)
CO2: 27 mmol/L (ref 22–32)
Calcium: 9.1 mg/dL (ref 8.9–10.3)
Chloride: 105 mmol/L (ref 98–111)
Creatinine: 0.6 mg/dL (ref 0.44–1.00)
GFR, Estimated: >60 mL/min (ref >60)
Glucose, Bld: 120 mg/dL — ABNORMAL HIGH (ref 70–99)
Potassium: 3.6 mmol/L (ref 3.5–5.1)
Sodium: 138 mmol/L (ref 135–145)
Total Bilirubin: 0.4 mg/dL (ref 0.3–1.2)
Total Protein: 6.6 g/dL (ref 6.5–8.1)

## 2022-11-08 LAB — PREGNANCY, URINE: Preg Test, Ur: NEGATIVE

## 2022-11-08 MED ORDER — HEPARIN SOD (PORK) LOCK FLUSH 100 UNIT/ML IV SOLN
500.0000 [IU] | Freq: Once | INTRAVENOUS | Status: AC | PRN
  Administered 2022-11-08: 500 [IU]

## 2022-11-08 MED ORDER — ACETAMINOPHEN 325 MG PO TABS
650.0000 mg | ORAL_TABLET | Freq: Once | ORAL | Status: AC
  Administered 2022-11-08: 650 mg via ORAL
  Filled 2022-11-08: qty 2

## 2022-11-08 MED ORDER — SODIUM CHLORIDE 0.9% FLUSH
10.0000 mL | Freq: Once | INTRAVENOUS | Status: AC
  Administered 2022-11-08: 10 mL

## 2022-11-08 MED ORDER — SODIUM CHLORIDE 0.9 % IV SOLN
75.0000 mg/m2 | Freq: Once | INTRAVENOUS | Status: AC
  Administered 2022-11-08: 126 mg via INTRAVENOUS
  Filled 2022-11-08: qty 12.6

## 2022-11-08 MED ORDER — SODIUM CHLORIDE 0.9 % IV SOLN
600.0000 mg/m2 | Freq: Once | INTRAVENOUS | Status: AC
  Administered 2022-11-08: 1000 mg via INTRAVENOUS
  Filled 2022-11-08: qty 50

## 2022-11-08 MED ORDER — SODIUM CHLORIDE 0.9 % IV SOLN: Freq: Once | INTRAVENOUS | Status: AC

## 2022-11-08 MED ORDER — SODIUM CHLORIDE 0.9% FLUSH
10.0000 mL | INTRAVENOUS | Status: DC | PRN
  Administered 2022-11-08: 10 mL

## 2022-11-08 MED ORDER — SODIUM CHLORIDE 0.9 % IV SOLN
10.0000 mg | Freq: Once | INTRAVENOUS | Status: AC
  Administered 2022-11-08: 10 mg via INTRAVENOUS
  Filled 2022-11-08: qty 10

## 2022-11-08 MED ORDER — PALONOSETRON HCL INJECTION 0.25 MG/5ML
0.2500 mg | Freq: Once | INTRAVENOUS | Status: AC
  Administered 2022-11-08: 0.25 mg via INTRAVENOUS
  Filled 2022-11-08: qty 5

## 2022-11-08 NOTE — Patient Instructions (Signed)
[facility]  Discharge Instructions: Thank you for choosing [facility] to provide your oncology and hematology care.   If you have a lab appointment with the Cancer Center, please go directly to the Cancer Center and check in at the registration area.   Wear comfortable clothing and clothing appropriate for easy access to any Portacath or PICC line.   We strive to give you quality time with your provider. You may need to reschedule your appointment if you arrive late (15 or more minutes).  Arriving late affects you and other patients whose appointments are after yours.  Also, if you miss three or more appointments without notifying the office, you may be dismissed from the clinic at the provider's discretion.      For prescription refill requests, have your pharmacy contact our office and allow 72 hours for refills to be completed.    Today you received the following chemotherapy and/or immunotherapy agents; Docetaxel & Cytoxan      To help prevent nausea and vomiting after your treatment, we encourage you to take your nausea medication as directed.  BELOW ARE SYMPTOMS THAT SHOULD BE REPORTED IMMEDIATELY: *FEVER GREATER THAN 100.4 F (38 C) OR HIGHER *CHILLS OR SWEATING *NAUSEA AND VOMITING THAT IS NOT CONTROLLED WITH YOUR NAUSEA MEDICATION *UNUSUAL SHORTNESS OF BREATH *UNUSUAL BRUISING OR BLEEDING *URINARY PROBLEMS (pain or burning when urinating, or frequent urination) *BOWEL PROBLEMS (unusual diarrhea, constipation, pain near the anus) TENDERNESS IN MOUTH AND THROAT WITH OR WITHOUT PRESENCE OF ULCERS (sore throat, sores in mouth, or a toothache) UNUSUAL RASH, SWELLING OR PAIN  UNUSUAL VAGINAL DISCHARGE OR ITCHING   Items with * indicate a potential emergency and should be followed up as soon as possible or go to the Emergency Department if any problems should occur.  Please show the CHEMOTHERAPY ALERT CARD or IMMUNOTHERAPY ALERT  CARD at check-in to the Emergency Department and triage nurse.  Should you have questions after your visit or need to cancel or reschedule your appointment, please contact [facility]  Dept: [phone]  and follow the prompts.  Office hours are 8:00 a.m. to 4:30 p.m. Monday - Friday. Please note that voicemails left after 4:00 p.m. may not be returned until the following business day.  We are closed weekends and major holidays. You have access to a nurse at all times for urgent questions. Please call the main number to the clinic Dept: [phone] and follow the prompts.   For any non-urgent questions, you may also contact your provider using MyChart. We now offer e-Visits for anyone 18 and older to request care online for non-urgent symptoms. For details visit [email or web address].   Also download the MyChart app! Go to the app store, search "MyChart", open the app, select , and log in with your MyChart username and password.   

## 2022-11-11 ENCOUNTER — Inpatient Hospital Stay: Payer: Self-pay

## 2022-11-11 ENCOUNTER — Telehealth: Payer: Self-pay

## 2022-11-11 VITALS — BP 108/68 | HR 88 | Temp 98.7°F | Resp 16

## 2022-11-11 DIAGNOSIS — C50211 Malignant neoplasm of upper-inner quadrant of right female breast: Secondary | ICD-10-CM

## 2022-11-11 MED ORDER — PEGFILGRASTIM-JMDB 6 MG/0.6ML ~~LOC~~ SOSY
6.0000 mg | PREFILLED_SYRINGE | Freq: Once | SUBCUTANEOUS | Status: AC
  Administered 2022-11-11: 6 mg via SUBCUTANEOUS
  Filled 2022-11-11: qty 0.6

## 2022-11-11 NOTE — Telephone Encounter (Signed)
-----   Message from Nurse Reece Levy sent at 11/08/2022  2:17 PM EDT ----- Regarding: Dr. Pamelia Hoit 1st time Docetaxel/Cytoxan f/u Dr. Pamelia Hoit 1st time Docetaxel/Cytoxan w/ Dignicap Pt tolerated tx well without incident. Pt call back due.

## 2022-11-11 NOTE — Telephone Encounter (Signed)
Called to follow up and see how she is doing after her first treatment. She is doing well. She is eating and drinking with no problems. Her only complaint is that her mouth feels yucky. She is gargling with warm salt water. She will call the office back for questions/concerns.

## 2022-11-13 ENCOUNTER — Encounter (HOSPITAL_COMMUNITY): Payer: Self-pay

## 2022-11-14 ENCOUNTER — Other Ambulatory Visit: Payer: Self-pay | Admitting: *Deleted

## 2022-11-14 ENCOUNTER — Telehealth: Payer: Self-pay | Admitting: *Deleted

## 2022-11-14 DIAGNOSIS — C50211 Malignant neoplasm of upper-inner quadrant of right female breast: Secondary | ICD-10-CM

## 2022-11-14 DIAGNOSIS — R899 Unspecified abnormal finding in specimens from other organs, systems and tissues: Secondary | ICD-10-CM

## 2022-11-14 NOTE — Telephone Encounter (Signed)
This RN discussed with  pt result of Signetara- with need for obtaining baseline studies due to mild elevation.  Orders placed per above.

## 2022-11-14 NOTE — Telephone Encounter (Signed)
This RN left message on pt's VM requesting return call to review lab results and MD recommendations.  Of note - pt was originally seen by Dr Myna Hidalgo at the [facility]- who obtained a Signatera as part of her work  up - now resulting as positive.  Per Dr Pamelia Hoit - her attending oncologist now (pt transferred care to this office) per results recommends for further staging studies primarily for baseline in setting of these results.

## 2022-11-15 ENCOUNTER — Other Ambulatory Visit: Payer: Self-pay | Admitting: *Deleted

## 2022-11-19 ENCOUNTER — Encounter: Payer: Self-pay | Admitting: Hematology and Oncology

## 2022-11-20 ENCOUNTER — Other Ambulatory Visit: Payer: Self-pay | Admitting: *Deleted

## 2022-11-20 DIAGNOSIS — C50211 Malignant neoplasm of upper-inner quadrant of right female breast: Secondary | ICD-10-CM

## 2022-11-20 DIAGNOSIS — R978 Other abnormal tumor markers: Secondary | ICD-10-CM

## 2022-11-21 ENCOUNTER — Encounter
Admission: RE | Admit: 2022-11-21 | Discharge: 2022-11-21 | Disposition: A | Discharge status: Home / Self Care | Payer: Self-pay | Source: Ambulatory Visit | Attending: Hematology and Oncology | Admitting: Hematology and Oncology

## 2022-11-21 ENCOUNTER — Other Ambulatory Visit: Payer: Self-pay | Admitting: Hematology and Oncology

## 2022-11-21 ENCOUNTER — Telehealth: Payer: Self-pay

## 2022-11-21 ENCOUNTER — Ambulatory Visit
Admission: RE | Admit: 2022-11-21 | Discharge: 2022-11-21 | Disposition: A | Discharge status: Home / Self Care | Payer: Self-pay | Source: Ambulatory Visit | Attending: Hematology and Oncology | Admitting: Hematology and Oncology

## 2022-11-21 ENCOUNTER — Ambulatory Visit (HOSPITAL_COMMUNITY)
Admission: RE | Admit: 2022-11-21 | Discharge: 2022-11-21 | Disposition: A | Discharge status: Home / Self Care | Payer: Self-pay | Source: Ambulatory Visit | Attending: Hematology and Oncology | Admitting: Hematology and Oncology

## 2022-11-21 ENCOUNTER — Other Ambulatory Visit: Payer: Self-pay

## 2022-11-21 DIAGNOSIS — I2699 Other pulmonary embolism without acute cor pulmonale: Secondary | ICD-10-CM | POA: Insufficient documentation

## 2022-11-21 DIAGNOSIS — C50211 Malignant neoplasm of upper-inner quadrant of right female breast: Secondary | ICD-10-CM | POA: Insufficient documentation

## 2022-11-21 DIAGNOSIS — R978 Other abnormal tumor markers: Secondary | ICD-10-CM | POA: Insufficient documentation

## 2022-11-21 MED ORDER — LEVOFLOXACIN 750 MG PO TABS: 750.0000 mg | ORAL_TABLET | Freq: Every day | ORAL | 0 refills | Status: DC

## 2022-11-21 MED ORDER — RIVAROXABAN (XARELTO) VTE STARTER PACK (15 & 20 MG): ORAL_TABLET | ORAL | 0 refills | Status: DC

## 2022-11-21 MED ORDER — TECHNETIUM TC 99M MEDRONATE IV KIT
20.0000 | PACK | Freq: Once | INTRAVENOUS | Status: AC | PRN
  Administered 2022-11-21: 21.44 via INTRAVENOUS

## 2022-11-21 MED ORDER — IOHEXOL 300 MG/ML  SOLN
80.0000 mL | Freq: Once | INTRAMUSCULAR | Status: AC | PRN
  Administered 2022-11-21: 80 mL via INTRAVENOUS

## 2022-11-21 MED ORDER — SODIUM CHLORIDE (PF) 0.9 % IJ SOLN
INTRAMUSCULAR | Status: AC
  Filled 2022-11-21: qty 50

## 2022-11-21 MED ORDER — IOHEXOL 350 MG/ML SOLN
100.0000 mL | Freq: Once | INTRAVENOUS | Status: AC | PRN
  Administered 2022-11-21: 100 mL via INTRAVENOUS

## 2022-11-21 NOTE — Progress Notes (Signed)
I received a phone call from radiology regarding her CT chest abdomen pelvis. They noticed small pulmonary embolism and recommended that we obtain a CT angiogram for further evaluation.  Patient agrees with that.  There was also note of slight pneumonia on the CT scan.  Plan: Start patient on Xarelto for anticoagulation Obtain CT angiogram Start Levaquin antibiotic for the findings of pneumonia on the CT scan.  Patient has cough with slight expectoration but denies any fevers or chills.

## 2022-11-21 NOTE — Telephone Encounter (Signed)
Pt called and LVM stating she is having some congestion/cough and wonders if this will effect her chemo scheduled for 9/13. Attempted to call pt to father more information and recommend COVID testing. LVM for call back to advise.

## 2022-11-22 ENCOUNTER — Telehealth: Payer: Self-pay

## 2022-11-22 ENCOUNTER — Encounter: Payer: Self-pay | Admitting: Hematology and Oncology

## 2022-11-22 NOTE — Telephone Encounter (Signed)
Per MD, called pt to let her know the CTA showed she does not have a PE so she can d/c the xarelto that was sent in for her. She was encouraged to continue Levaquin as prescribed for PNA. She reports scant blood tinged mucous this morning. Pt was reassured that she does not have any indication in her lungs that would cause this and the blood tinge could be caused from coughing/irritation. She was encouraged to take COVID test to ensure she does not have COVID before starting tx 9/13. She will call us with the results.

## 2022-11-22 NOTE — Telephone Encounter (Signed)
Patient is negative for COVID. She will continue Levaquin as prescribed.

## 2022-11-25 ENCOUNTER — Telehealth: Payer: Self-pay | Admitting: *Deleted

## 2022-11-25 NOTE — Group Note (Deleted)

## 2022-11-25 NOTE — Progress Notes (Signed)
Patient Care Team: Waynard Reeds, MD as PCP - General (Obstetrics and Gynecology) Donnelly Angelica, RN as Oncology Nurse Navigator Lu Duffel, Margretta Ditty, RN as Oncology Nurse Navigator Serena Croissant, MD as Consulting Physician (Hematology and Oncology) Manus Rudd, MD as Consulting Physician (General Surgery) Dorothy Puffer, MD as Consulting Physician (Radiation Oncology)  DIAGNOSIS:  Encounter Diagnosis  Name Primary?   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 Report date is 09/17/2022.  The CancerNext-Expanded gene panel offered by [facility] and includes sequencing, rearrangement, and RNA analysis for the following 71 genes: AIP, ALK, APC, ATM, AXIN2, BAP1, BARD1, BMPR1A, BRCA1, BRCA2, BRIP1, CDC73, CDH1, CDK4, CDKN1B, CDKN2A, CHEK2, CTNNA1, DICER1, FH, FLCN, KIF1B, LZTR1, MAX, MEN1, MET, MLH1, MSH2, MSH3, MSH6, MUTYH, NF1, NF2, NTHL1, PALB2, PHOX2B, PMS2, POT1, PRKAR1A, PTCH1, PTEN, RAD51C, RAD51D, RB1, RET, SDHA, SDHAF2, SDHB, SDHC, SDHD, SMAD4, SMARCA4, SMARCB1, SMARCE1, STK11, SUFU, TMEM127, TP53, TSC1, TSC2, and VHL (sequencing and deletion/duplication); EGFR, EGLN1, HOXB13, KIT, MITF, PDGFRA, POLD1, and POLE (sequencing only); EPCAM and GREM1  (deletion/duplication only).    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 Oncotype DX score 25   11/08/2022 -  Chemotherapy   Patient is on Treatment Plan : BREAST TC q21d       CHIEF COMPLIANT:   Discussed the use of AI scribe software for clinical note transcription with the patient, who gave verbal consent to proceed.  History of Present Illness   The patient, with a history of breast cancer, recently underwent chemotherapy. They report experiencing side effects including a change in taste, described as everything tasting like cardboard, and fatigue lasting for two to three days. They also experienced minor bone pain for a few hours on one day, which was managed with Claritin. The patient denies experiencing constipation, but reports a few instances of loose bowel movements. They are using a DigniCap for hair loss prevention during chemotherapy and report shedding hair, but not to a concerning degree.  The patient underwent a bone scan and was tested for clots, both of which came back clear. 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 The patient expresses concern about the positive Cignatera test and its implications for their prognosis. They also express concern about the potential for recurrence of their cancer, given their Oncotype test indicated a 7% chance of recurrence over the next 10 years.         ALLERGIES:  is allergic to aluminum-containing compounds.  MEDICATIONS:  Current Outpatient Medications  Medication Sig Dispense Refill   Ascorbic Acid (VITAMIN C) 1000 MG tablet Take 1,000 mg by mouth daily.     b complex  vitamins capsule Take 1 capsule by mouth daily.     Calcium Citrate-Vitamin D (CALCIUM + D PO) Take 1 tablet by mouth daily.      dexamethasone (DECADRON) 4 MG tablet Take 1 tablet day before chemo and 1 tablet day after chemo with food 8 tablet 0   lidocaine-prilocaine (EMLA) cream Apply to affected area once 30 g 3   magnesium 30 MG tablet Take 30 mg by mouth 2 (two) times daily.     ondansetron (ZOFRAN) 8 MG tablet Take 1 tablet (8 mg total) by mouth every 8 (eight) hours as needed for nausea or vomiting. Start on the third day after chemotherapy. 30 tablet 1   OVER THE COUNTER MEDICATION Take 1 capsule by mouth daily. EstroCleanse     OVER THE COUNTER MEDICATION Take 1 capsule by mouth daily. Sulforaphane     prochlorperazine (COMPAZINE) 10 MG tablet Take 1 tablet (10 mg total) by mouth every 6 (six) hours as needed for nausea or vomiting. 30 tablet 1   QUERCETIN PO Take 1 tablet by mouth daily.     RIVAROXABAN (XARELTO) VTE STARTER PACK (15 & 20 MG) Follow package directions: Take one 15mg  tablet by mouth twice a day. On day 22, switch to one 20mg  tablet once a day. Take with food. 51 each 0   Turmeric 400 MG CAPS Take 400 mg by mouth daily.     Zinc 30 MG CAPS Take 30 mg by mouth daily.     No current facility-administered medications for this visit.   Facility-Administered Medications Ordered in Other Visits  Medication Dose Route Frequency Provider Last Rate Last Admin   0.9 %  sodium chloride infusion   Intravenous Once Serena Croissant, MD       cyclophosphamide (CYTOXAN) 1,000 mg in sodium chloride 0.9 % 250 mL chemo infusion  600 mg/m2 (Treatment Plan Recorded) Intravenous Once Serena Croissant, MD       dexamethasone (DECADRON) 10 mg in sodium chloride 0.9 % 50 mL IVPB  10 mg Intravenous Once Serena Croissant, MD       DOCEtaxel (TAXOTERE) 126 mg in sodium chloride 0.9 % 250 mL chemo infusion  75 mg/m2 (Treatment Plan Recorded) Intravenous Once Serena Croissant, MD       heparin lock flush 100 unit/mL  500 Units Intracatheter Once PRN Serena Croissant, MD       palonosetron (ALOXI) injection 0.25 mg  0.25 mg Intravenous  Once Serena Croissant, MD       sodium chloride flush (NS) 0.9 % injection 10 mL  10 mL Intracatheter PRN Serena Croissant, MD        PHYSICAL EXAMINATION: ECOG PERFORMANCE STATUS: 1 - Symptomatic but completely ambulatory  Vitals:   11/29/22 1047  BP: (!) 124/55  Pulse: 90  Resp: 18  Temp: 97.7 F (36.5 C)  SpO2: 100%   Filed Weights   11/29/22 1047  Weight: 127 lb 8 oz (57.8 kg)    Physical Exam          (exam performed in the presence of a chaperone)  LABORATORY DATA:  I have reviewed the data as listed    Latest Ref Rng & Units 11/29/2022   10:31 AM 11/08/2022    7:47 AM 10/16/2022   10:03 AM  CMP  Glucose 70 - 99 mg/dL 97  086  87   BUN 6 - 20 mg/dL 11  10  16    Creatinine 0.44 - 1.00 mg/dL 5.78  4.69  6.29  Sodium 135 - 145 mmol/L 141  138  142   Potassium 3.5 - 5.1 mmol/L 4.0  3.6  4.0   Chloride 98 - 111 mmol/L 105  105  104   CO2 22 - 32 mmol/L 29  27  28    Calcium 8.9 - 10.3 mg/dL 9.3  9.1  9.9   Total Protein 6.5 - 8.1 g/dL 7.1  6.6  7.7   Total Bilirubin 0.3 - 1.2 mg/dL 0.5  0.4  0.4   Alkaline Phos 38 - 126 U/L 47  36  45   AST 15 - 41 U/L 11  8  12    ALT 0 - 44 U/L 16  12  12      Lab Results  Component Value Date   WBC 8.4 11/29/2022   HGB 11.9 (L) 11/29/2022   HCT 35.3 (L) 11/29/2022   MCV 90.1 11/29/2022   PLT 458 (H) 11/29/2022   NEUTROABS 6.2 11/29/2022    ASSESSMENT & PLAN:  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 Oncotype DX score 25 11/21/2022: CT CAP: No metastatic disease.  Suspected acute PE left lower lobe 11/21/2022: CT angiogram: No PE, pneumonia 11/27/2022: Bone scan: No metastatic disease.  Treatment plan: Adjuvant chemotherapy with Taxotere and Cytoxan every 3  weeks x 4 I also discussed offset clinical trial where there is randomization between complete estrogen blockade to chemotherapy Adjuvant antiestrogen therapy with complete estrogen blockade ------------------------------------------------------------------------------------------------------------------------------------ Current treatment: Cycle 2 Taxotere and Cytoxan Chemo toxicities: Other than mild fatigue she tolerated treatment extremely well.  Participating in [place], study Return to clinic in 3 weeks for cycle 3 ------------------------------------- Assessment and Plan    Breast Cancer Undergoing chemotherapy with good tolerance. Reported side effects include altered taste, fatigue for a few days post-treatment, and mild bone pain. No nausea, constipation, or hand/foot issues. -Continue current chemotherapy regimen. -Plan to discuss hormone therapies after completion of chemotherapy.  Chemotherapy-induced side effects Altered taste, fatigue, and mild bone pain. No significant nausea, constipation, or hand/foot issues. -Continue current supportive care measures. -Consider Tylenol for headache associated with DigniCap use.  Signatera Test Positive result before completion of chemotherapy. Discussed the limitations of the test and the potential for false positives due to DNA spillage from surgery. -Plan to repeat Signatera test after completion of chemotherapy. +CT scans and Bone scans are neg for cancer  Follow-up -Next chemotherapy treatment scheduled for 12/20/2022.          No orders of the defined types were placed in this encounter.  The patient has a good understanding of the overall plan. she agrees with it. she will call with any problems that may develop before the next visit here. Total time spent: 30 mins including face to face time and time spent for planning, charting and co-ordination of care   Tamsen Meek, MD 11/29/22

## 2022-11-25 NOTE — Telephone Encounter (Signed)
Effectiveness of Out-of-Pocket Psychologist, forensic ([employer]) in Cancer Patients   Research nurse called pt to see if she had any questions about the above study.  The pt said that she has not read the consent form.  The nurse encouraged the pt to watch a Youtube video about the study.  The pt said that she would try to read about the study or watch the video before her next appointment on Friday, 11/29/22.  The pt was thanked for her consideration of this study.  Janan Ridge RN, BSN, CCRP Clinical Research Nurse Lead 11/25/2022 2:30 PM

## 2022-11-28 ENCOUNTER — Encounter (HOSPITAL_COMMUNITY): Payer: Self-pay

## 2022-11-28 ENCOUNTER — Ambulatory Visit (HOSPITAL_COMMUNITY): Payer: Self-pay

## 2022-11-28 MED FILL — Dexamethasone Sodium Phosphate Inj 100 MG/10ML: INTRAMUSCULAR | Qty: 1 | Status: AC

## 2022-11-29 ENCOUNTER — Inpatient Hospital Stay: Payer: Self-pay

## 2022-11-29 ENCOUNTER — Encounter: Payer: Self-pay | Admitting: *Deleted

## 2022-11-29 ENCOUNTER — Inpatient Hospital Stay: Payer: Self-pay | Attending: Hematology & Oncology

## 2022-11-29 ENCOUNTER — Inpatient Hospital Stay (HOSPITAL_BASED_OUTPATIENT_CLINIC_OR_DEPARTMENT_OTHER): Payer: Self-pay | Admitting: Hematology and Oncology

## 2022-11-29 VITALS — BP 124/55 | HR 90 | Temp 97.7°F | Resp 18 | Ht 67.0 in | Wt 127.5 lb

## 2022-11-29 VITALS — BP 117/74 | HR 75 | Temp 97.7°F | Resp 16

## 2022-11-29 DIAGNOSIS — Z170 Estrogen receptor positive status [ER+]: Secondary | ICD-10-CM | POA: Insufficient documentation

## 2022-11-29 DIAGNOSIS — C50211 Malignant neoplasm of upper-inner quadrant of right female breast: Secondary | ICD-10-CM

## 2022-11-29 DIAGNOSIS — Z5189 Encounter for other specified aftercare: Secondary | ICD-10-CM | POA: Insufficient documentation

## 2022-11-29 DIAGNOSIS — Z5111 Encounter for antineoplastic chemotherapy: Secondary | ICD-10-CM | POA: Insufficient documentation

## 2022-11-29 DIAGNOSIS — N3000 Acute cystitis without hematuria: Secondary | ICD-10-CM | POA: Insufficient documentation

## 2022-11-29 LAB — CMP (CANCER CENTER ONLY)
ALT: 16 U/L (ref 0–44)
AST: 11 U/L — ABNORMAL LOW (ref 15–41)
Albumin: 4.3 g/dL (ref 3.5–5.0)
Alkaline Phosphatase: 47 U/L (ref 38–126)
Anion gap: 7 (ref 5–15)
BUN: 11 mg/dL (ref 6–20)
CO2: 29 mmol/L (ref 22–32)
Calcium: 9.3 mg/dL (ref 8.9–10.3)
Chloride: 105 mmol/L (ref 98–111)
Creatinine: 0.61 mg/dL (ref 0.44–1.00)
GFR, Estimated: >60 mL/min (ref >60)
Glucose, Bld: 97 mg/dL (ref 70–99)
Potassium: 4 mmol/L (ref 3.5–5.1)
Sodium: 141 mmol/L (ref 135–145)
Total Bilirubin: 0.5 mg/dL (ref 0.3–1.2)
Total Protein: 7.1 g/dL (ref 6.5–8.1)

## 2022-11-29 LAB — CBC WITH DIFFERENTIAL (CANCER CENTER ONLY)
Abs Immature Granulocytes: 0.04 10*3/uL (ref 0.00–0.07)
Basophils Absolute: 0.1 10*3/uL (ref 0.0–0.1)
Basophils Relative: 1 %
Eosinophils Absolute: 0 10*3/uL (ref 0.0–0.5)
Eosinophils Relative: 0 %
HCT: 35.3 % — ABNORMAL LOW (ref 36.0–46.0)
Hemoglobin: 11.9 g/dL — ABNORMAL LOW (ref 12.0–15.0)
Immature Granulocytes: 1 %
Lymphocytes Relative: 14 %
Lymphs Abs: 1.2 10*3/uL (ref 0.7–4.0)
MCH: 30.4 pg (ref 26.0–34.0)
MCHC: 33.7 g/dL (ref 30.0–36.0)
MCV: 90.1 fL (ref 80.0–100.0)
Monocytes Absolute: 0.9 10*3/uL (ref 0.1–1.0)
Monocytes Relative: 11 %
Neutro Abs: 6.2 10*3/uL (ref 1.7–7.7)
Neutrophils Relative %: 73 %
Platelet Count: 458 10*3/uL — ABNORMAL HIGH (ref 150–400)
RBC: 3.92 MIL/uL (ref 3.87–5.11)
RDW: 13.9 % (ref 11.5–15.5)
WBC Count: 8.4 10*3/uL (ref 4.0–10.5)
nRBC: 0 % (ref 0.0–0.2)

## 2022-11-29 LAB — PREGNANCY, URINE: Preg Test, Ur: NEGATIVE

## 2022-11-29 MED ORDER — SODIUM CHLORIDE 0.9 % IV SOLN: Freq: Once | INTRAVENOUS | Status: AC

## 2022-11-29 MED ORDER — SODIUM CHLORIDE 0.9% FLUSH
10.0000 mL | INTRAVENOUS | Status: DC | PRN
  Administered 2022-11-29: 10 mL

## 2022-11-29 MED ORDER — SODIUM CHLORIDE 0.9 % IV SOLN
10.0000 mg | Freq: Once | INTRAVENOUS | Status: AC
  Administered 2022-11-29: 10 mg via INTRAVENOUS
  Filled 2022-11-29: qty 10

## 2022-11-29 MED ORDER — SODIUM CHLORIDE 0.9 % IV SOLN
600.0000 mg/m2 | Freq: Once | INTRAVENOUS | Status: AC
  Administered 2022-11-29: 1000 mg via INTRAVENOUS
  Filled 2022-11-29: qty 50

## 2022-11-29 MED ORDER — PALONOSETRON HCL INJECTION 0.25 MG/5ML
0.2500 mg | Freq: Once | INTRAVENOUS | Status: AC
  Administered 2022-11-29: 0.25 mg via INTRAVENOUS
  Filled 2022-11-29: qty 5

## 2022-11-29 MED ORDER — HEPARIN SOD (PORK) LOCK FLUSH 100 UNIT/ML IV SOLN
500.0000 [IU] | Freq: Once | INTRAVENOUS | Status: AC | PRN
  Administered 2022-11-29: 500 [IU]

## 2022-11-29 MED ORDER — SODIUM CHLORIDE 0.9 % IV SOLN
75.0000 mg/m2 | Freq: Once | INTRAVENOUS | Status: AC
  Administered 2022-11-29: 126 mg via INTRAVENOUS
  Filled 2022-11-29: qty 12.6

## 2022-11-29 NOTE — Research (Signed)
Effectiveness of Out-of-Pocket Psychologist, forensic ([employer]) in Cancer Patients   DCP-001: Use of a Clinical Trial Screening Tool to Address Cancer Health Disparities in the [employer] Community Oncology Research Program [place])   The pt was into the [facility] for her routine MD appts and for her treatment.  The pt was approached to see if she had any questions about the above 2 studies.  The pt said that she was too overwhelmed to take on anything now.  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  She said that she did not want to participate in the studies.  The pt was thanked for her time and consideration of these trials.   Janan Ridge RN, BSN, CCRP Clinical Research Nurse Lead 11/29/2022 11:45 AM

## 2022-11-29 NOTE — Patient Instructions (Addendum)
[place] [facility]   Discharge Instructions: Thank you for choosing [facility] to provide your oncology and hematology care.   If you have a lab appointment with the Cancer Center, please go directly to the Cancer Center and check in at the registration area.   Wear comfortable clothing and clothing appropriate for easy access to any Portacath or PICC line.   We strive to give you quality time with your provider. You may need to reschedule your appointment if you arrive late (15 or more minutes).  Arriving late affects you and other patients whose appointments are after yours.  Also, if you miss three or more appointments without notifying the office, you may be dismissed from the clinic at the provider's discretion.      For prescription refill requests, have your pharmacy contact our office and allow 72 hours for refills to be completed.    Today you received the following chemotherapy and/or immunotherapy agents: Docetaxel (Taxotere) and Cyclophosphamide (Cytoxan)       To help prevent nausea and vomiting after your treatment, we encourage you to take your nausea medication as directed.  BELOW ARE SYMPTOMS THAT SHOULD BE REPORTED IMMEDIATELY: *FEVER GREATER THAN 100.4 F (38 C) OR HIGHER *CHILLS OR SWEATING *NAUSEA AND VOMITING THAT IS NOT CONTROLLED WITH YOUR NAUSEA MEDICATION *UNUSUAL SHORTNESS OF BREATH *UNUSUAL BRUISING OR BLEEDING *URINARY PROBLEMS (pain or burning when urinating, or frequent urination) *BOWEL PROBLEMS (unusual diarrhea, constipation, pain near the anus) TENDERNESS IN MOUTH AND THROAT WITH OR WITHOUT PRESENCE OF ULCERS (sore throat, sores in mouth, or a toothache) UNUSUAL RASH, SWELLING OR PAIN  UNUSUAL VAGINAL DISCHARGE OR ITCHING   Items with * indicate a potential emergency and should be followed up as soon as possible or go to the Emergency Department if any problems should occur.  Please show the CHEMOTHERAPY  ALERT CARD or IMMUNOTHERAPY ALERT CARD at check-in to the Emergency Department and triage nurse.  Should you have questions after your visit or need to cancel or reschedule your appointment, please contact  [facility]  Dept: [phone]  and follow the prompts.  Office hours are 8:00 a.m. to 4:30 p.m. Monday - Friday. Please note that voicemails left after 4:00 p.m. may not be returned until the following business day.  We are closed weekends and major holidays. You have access to a nurse at all times for urgent questions. Please call the main number to the clinic Dept: [phone] and follow the prompts.   For any non-urgent questions, you may also contact your provider using MyChart. We now offer e-Visits for anyone 32 and older to request care online for non-urgent symptoms. For details visit [email or web address].   Also download the MyChart app! Go to the app store, search "MyChart", open the app, select , and log in with your MyChart username and password.

## 2022-11-29 NOTE — Assessment & Plan Note (Signed)
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 Oncotype DX score 25 11/21/2022: CT CAP: No metastatic disease.  Suspected acute PE left lower lobe 11/21/2022: CT angiogram: No PE, pneumonia 11/27/2022: Bone scan: No metastatic disease.  Treatment plan: Adjuvant chemotherapy with Taxotere and Cytoxan every 3 weeks x 4 I also discussed offset clinical trial where there is randomization between complete estrogen blockade to chemotherapy Adjuvant antiestrogen therapy with complete estrogen blockade ------------------------------------------------------------------------------------------------------------------------------------ Current treatment: Cycle 2 Taxotere and Cytoxan Chemo toxicities:  Participating in [employer], study Return to clinic in 3 weeks for cycle 3

## 2022-12-01 ENCOUNTER — Encounter: Payer: Self-pay | Admitting: Hematology and Oncology

## 2022-12-02 ENCOUNTER — Inpatient Hospital Stay: Payer: Self-pay

## 2022-12-02 ENCOUNTER — Inpatient Hospital Stay (HOSPITAL_BASED_OUTPATIENT_CLINIC_OR_DEPARTMENT_OTHER): Payer: Self-pay | Admitting: Physician Assistant

## 2022-12-02 ENCOUNTER — Other Ambulatory Visit: Payer: Self-pay

## 2022-12-02 VITALS — BP 127/74 | HR 97 | Temp 98.0°F | Resp 18

## 2022-12-02 DIAGNOSIS — C50211 Malignant neoplasm of upper-inner quadrant of right female breast: Secondary | ICD-10-CM

## 2022-12-02 DIAGNOSIS — N3000 Acute cystitis without hematuria: Secondary | ICD-10-CM

## 2022-12-02 LAB — URINALYSIS, ROUTINE W REFLEX MICROSCOPIC
Bacteria, UA: NONE SEEN
Bilirubin Urine: NEGATIVE
Glucose, UA: NEGATIVE mg/dL
Hgb urine dipstick: NEGATIVE
Ketones, ur: NEGATIVE mg/dL
Nitrite: NEGATIVE
Protein, ur: NEGATIVE mg/dL
Specific Gravity, Urine: 1.003 — ABNORMAL LOW (ref 1.005–1.030)
pH: 6 (ref 5.0–8.0)

## 2022-12-02 MED ORDER — PEGFILGRASTIM-JMDB 6 MG/0.6ML ~~LOC~~ SOSY
6.0000 mg | PREFILLED_SYRINGE | Freq: Once | SUBCUTANEOUS | Status: AC
  Administered 2022-12-02: 6 mg via SUBCUTANEOUS
  Filled 2022-12-02: qty 0.6

## 2022-12-02 MED ORDER — NITROFURANTOIN MONOHYD MACRO 100 MG PO CAPS: 100.0000 mg | ORAL_CAPSULE | Freq: Two times a day (BID) | ORAL | 0 refills | Status: AC

## 2022-12-02 NOTE — Progress Notes (Signed)
Symptom Management Consult Note [facility]    Patient Care Team: Waynard Reeds, MD as PCP - General (Obstetrics and Gynecology) Donnelly Angelica, RN as Oncology Nurse Navigator Lu Duffel, Margretta Ditty, RN as Oncology Nurse Navigator Serena Croissant, MD as Consulting Physician (Hematology and Oncology) Manus Rudd, MD as Consulting Physician (General Surgery) Dorothy Puffer, MD as Consulting Physician (Radiation Oncology)    Name / MRN / DOB: Rachel Singleton  [id number]  12/15/1980   Date of visit: 12/02/2022   Chief Complaint/Reason for visit: concern for UTI   Current Therapy: Cyclophosphamide and docetaxel  Last treatment:  Day 1   Cycle 2 on 11/29/22   ASSESSMENT & PLAN: Patient is a 42 y.o. 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 Pamelia Hoit.  I have viewed most recent oncology note and lab work.    #Primary malignant neoplasm of upper inner quadrant of right breast  - Next appointment with oncologist is 12/20/22.   #Concern for UTI -Patient feeling unable to empty bladder. Had similar symptoms after last treatment that quickly resolved.  -PE with tenderness to suprapubic. No CVA tenderness. -UA showing small leukocytes. Urine culture sent.  -Engaged in shared decision making with patient regarding starting antibiotics now vs waiting for the culture to result. She prefers to start antibiotics now and will follow up on culture result. If culture is negative discussed with patient this could be related to treatment and she will need to discuss it further oncologist.   Strict ED precautions discussed should symptoms worsen.   Heme/Onc History: Oncology History  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)  09/01/2022 Initial Diagnosis   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)   09/01/2022 Cancer Staging   Staging form: Breast, AJCC  8th Edition - Clinical stage from 09/01/2022: Stage IIA (cT2, cN0, cM0, G3, ER+, PR+, HER2-) - Signed by Ronny Bacon, PA-C on 09/01/2022 Stage prefix: Initial diagnosis Method of lymph node assessment: Clinical Histologic grading system: 3 grade system    Genetic Testing   [facility] CancerNext-Expanded Panel+RNA was Negative. 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 Report date is 09/17/2022.  The CancerNext-Expanded gene panel offered by [facility] and includes sequencing, rearrangement, and RNA analysis for the following 71 genes: AIP, ALK, APC, ATM, AXIN2, BAP1, BARD1, BMPR1A, BRCA1, BRCA2, BRIP1, CDC73, CDH1, CDK4, CDKN1B, CDKN2A, CHEK2, CTNNA1, DICER1, FH, FLCN, KIF1B, LZTR1, MAX, MEN1, MET, MLH1, MSH2, MSH3, MSH6, MUTYH, NF1, NF2, NTHL1, PALB2, PHOX2B, PMS2, POT1, PRKAR1A, PTCH1, PTEN, RAD51C, RAD51D, RB1, RET, SDHA, SDHAF2, SDHB, SDHC, SDHD, SMAD4, SMARCA4, SMARCB1, SMARCE1, STK11, SUFU, TMEM127, TP53, TSC1, TSC2, and VHL (sequencing and deletion/duplication); EGFR, EGLN1, HOXB13, KIT, MITF, PDGFRA, POLD1, and POLE (sequencing only); EPCAM and GREM1 (deletion/duplication only).    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 Oncotype DX score 25   11/08/2022 -  Chemotherapy   Patient is on Treatment Plan : BREAST TC q21d         Interval history-: Rachel Singleton is a 42 y.o. female with oncologic history as above presenting to [facility] today with chief complaint of concern for UTI.  Patient presents unaccompanied for clinic today.  Patient scheduled for injection today.  She mention to RN that she was concerned she has UTI.  Patient reports feeling fullness in her bladder x 2 days.  Does not feel as if her bladder is fully emptying.   She denies any fever, chills, abdominal pain, dysuria or urinary frequency.  She states she had similar symptoms after her first treatment however they quickly resolved.  She does report history of UTIs in the past and symptoms were similar to what she is having now.    ROS  All other systems are reviewed and are negative for acute change except as noted in the HPI.    Allergies  Allergen Reactions   Aluminum-Containing Compounds Hives and Rash     Past Medical History:  Diagnosis Date   Asthma    pregnancy only   Breast cancer (HCC) 08/21/2022   Former smoker 03/18/2006   Quit 2008   Hx of varicella      Past Surgical History:  Procedure Laterality Date   BREAST BIOPSY Right 08/21/2022   MM RT BREAST BX W LOC DEV 1ST LESION IMAGE BX SPEC STEREO GUIDE 08/21/2022 [facility]   BREAST BIOPSY Right 08/21/2022   MM RT BREAST BX W LOC DEV EA AD LESION IMG BX SPEC STEREO GUIDE 08/21/2022 [facility]   BREAST BIOPSY Right 08/21/2022   [place] RT BREAST BX W LOC DEV 1ST LESION IMG BX SPEC US GUIDE 08/21/2022 [facility]   BREAST RECONSTRUCTION WITH PLACEMENT OF TISSUE EXPANDER AND ALLODERM Bilateral 09/17/2022   Procedure: BREAST RECONSTRUCTION WITH PLACEMENT OF TISSUE EXPANDER AND ALLODERM;  Surgeon: Glenna Fellows, MD;  Location: [facility];  Service: Plastics;  Laterality: Bilateral;   MASTECTOMY W/ SENTINEL NODE BIOPSY Right 09/17/2022   Procedure: RIGHT MASTECTOMY WITH SENTINEL LYMPH NODE BIOPSY;  Surgeon: Manus Rudd, MD;  Location: [facility];  Service: General;  Laterality: Right;   PORTACATH PLACEMENT N/A 11/07/2022   Procedure: ULTRASOUND-GUIDED PORT PLACEMENT;  Surgeon: Manus Rudd, MD;  Location: [facility];  Service: General;  Laterality: N/A;  LMA   SIMPLE MASTECTOMY WITH AXILLARY SENTINEL NODE BIOPSY Left 09/17/2022   Procedure: LEFT SIMPLE MASTECTOMY;  Surgeon: Manus Rudd, MD;  Location: [facility];  Service:  General;  Laterality: Left;   WISDOM TOOTH EXTRACTION      Social History   Socioeconomic History   Marital status: Married    Spouse name: Not on file   Number of children: Not on file   Years of education: Not on file   Highest education level: Not on file  Occupational History   Not on file  Tobacco Use   Smoking status: Never    Passive exposure: Past   Smokeless tobacco: Never  Vaping Use   Vaping status: Never Used  Substance and Sexual Activity   Alcohol use: No   Drug use: No   Sexual activity: Yes    Birth control/protection: None  Other Topics Concern   Not on file  Social History Narrative   ** Merged History Encounter **       Social Determinants of Health   Financial Resource Strain: Not on file  Food Insecurity: No Food Insecurity (09/04/2022)   Hunger Vital Sign    Worried About Running Out of Food in the Last Year: Never true    Ran Out of Food in the Last Year: Never true  Transportation Needs: No Transportation Needs (09/04/2022)   PRAPARE - Administrator, Civil Service (Medical): No  Lack of Transportation (Non-Medical): No  Physical Activity: Not on file  Stress: Not on file  Social Connections: Unknown (07/31/2021)   Received from [facility]   Social Network    Social Network: Not on file  Intimate Partner Violence: Not At Risk (09/04/2022)   Humiliation, Afraid, Rape, and Kick questionnaire    Fear of Current or Ex-Partner: No    Emotionally Abused: No    Physically Abused: No    Sexually Abused: No    Family History  Problem Relation Age of Onset   Breast cancer Mother 75       DCIS (-/-)   Heart disease Father    Heart attack Father    Cystic fibrosis Brother    Skin cancer Maternal Aunt 2 - 69   Breast cancer Paternal Aunt 27 - 69   Colon cancer Maternal Grandfather 80 - 75   Breast cancer Other 50 - 59     Current Outpatient Medications:    nitrofurantoin, macrocrystal-monohydrate, (MACROBID)  100 MG capsule, Take 1 capsule (100 mg total) by mouth 2 (two) times daily for 5 days., Disp: 10 capsule, Rfl: 0   Ascorbic Acid (VITAMIN C) 1000 MG tablet, Take 1,000 mg by mouth daily., Disp: , Rfl:    b complex vitamins capsule, Take 1 capsule by mouth daily., Disp: , Rfl:    Calcium Citrate-Vitamin D (CALCIUM + D PO), Take 1 tablet by mouth daily., Disp: , Rfl:    dexamethasone (DECADRON) 4 MG tablet, Take 1 tablet day before chemo and 1 tablet day after chemo with food, Disp: 8 tablet, Rfl: 0   lidocaine-prilocaine (EMLA) cream, Apply to affected area once, Disp: 30 g, Rfl: 3   magnesium 30 MG tablet, Take 30 mg by mouth 2 (two) times daily., Disp: , Rfl:    ondansetron (ZOFRAN) 8 MG tablet, Take 1 tablet (8 mg total) by mouth every 8 (eight) hours as needed for nausea or vomiting. Start on the third day after chemotherapy., Disp: 30 tablet, Rfl: 1   OVER THE COUNTER MEDICATION, Take 1 capsule by mouth daily. EstroCleanse, Disp: , Rfl:    OVER THE COUNTER MEDICATION, Take 1 capsule by mouth daily. Sulforaphane, Disp: , Rfl:    prochlorperazine (COMPAZINE) 10 MG tablet, Take 1 tablet (10 mg total) by mouth every 6 (six) hours as needed for nausea or vomiting., Disp: 30 tablet, Rfl: 1   QUERCETIN PO, Take 1 tablet by mouth daily., Disp: , Rfl:    RIVAROXABAN (XARELTO) VTE STARTER PACK (15 & 20 MG), Follow package directions: Take one 15mg  tablet by mouth twice a day. On day 22, switch to one 20mg  tablet once a day. Take with food., Disp: 51 each, Rfl: 0   Turmeric 400 MG CAPS, Take 400 mg by mouth daily., Disp: , Rfl:    Zinc 30 MG CAPS, Take 30 mg by mouth daily., Disp: , Rfl:   PHYSICAL EXAM: ECOG FS:1 - Symptomatic but completely ambulatory    Vitals:   12/02/22 1003  BP: 127/74  Pulse: 97  Resp: 18  Temp: 98 F (36.7 C)  TempSrc: Oral  SpO2: 100%   Physical Exam Vitals and nursing note reviewed.  Constitutional:      Appearance: She is not ill-appearing or toxic-appearing.   HENT:     Head: Normocephalic.  Eyes:     Conjunctiva/sclera: Conjunctivae normal.  Cardiovascular:     Rate and Rhythm: Normal rate.     Pulses: Normal pulses.  Pulmonary:     Effort: Pulmonary effort is normal.  Abdominal:     General: There is no distension.     Tenderness: There is abdominal tenderness in the suprapubic area. There is no right CVA tenderness or left CVA tenderness.  Musculoskeletal:     Cervical back: Normal range of motion.  Skin:    General: Skin is warm and dry.  Neurological:     Mental Status: She is alert.        LABORATORY DATA: I have reviewed the data as listed    Latest Ref Rng & Units 11/29/2022   10:31 AM 11/08/2022    7:47 AM 11/07/2022    9:48 AM  CBC  WBC 4.0 - 10.5 K/uL 8.4  8.9  7.7   Hemoglobin 12.0 - 15.0 g/dL 16.1  09.6  04.5   Hematocrit 36.0 - 46.0 % 35.3  36.3  39.4   Platelets 150 - 400 K/uL 458  218  216         Latest Ref Rng & Units 11/29/2022   10:31 AM 11/08/2022    7:47 AM 10/16/2022   10:03 AM  CMP  Glucose 70 - 99 mg/dL 97  409  87   BUN 6 - 20 mg/dL 11  10  16    Creatinine 0.44 - 1.00 mg/dL 8.11  9.14  7.82   Sodium 135 - 145 mmol/L 141  138  142   Potassium 3.5 - 5.1 mmol/L 4.0  3.6  4.0   Chloride 98 - 111 mmol/L 105  105  104   CO2 22 - 32 mmol/L 29  27  28    Calcium 8.9 - 10.3 mg/dL 9.3  9.1  9.9   Total Protein 6.5 - 8.1 g/dL 7.1  6.6  7.7   Total Bilirubin 0.3 - 1.2 mg/dL 0.5  0.4  0.4   Alkaline Phos 38 - 126 U/L 47  36  45   AST 15 - 41 U/L 11  8  12    ALT 0 - 44 U/L 16  12  12         RADIOGRAPHIC STUDIES (from last 24 hours if applicable) I have personally reviewed the radiological images as listed and agreed with the findings in the report. No results found.      Visit Diagnosis: 1. Acute cystitis without hematuria   2. 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)      No orders of the defined types were placed in this encounter.   All questions were  answered. The patient knows to call the clinic with any problems, questions or concerns. No barriers to learning was detected.  A total of more than 30 minutes were spent on this encounter with face-to-face time and non-face-to-face time, including preparing to see the patient, ordering tests and/or medications, counseling the patient and coordination of care as outlined above.    Thank you for allowing me to participate in the care of this patient.    Shanon Ace, PA-C Department of Hematology/Oncology [facility] at [facility] Phone: [phone]  [phone]    12/02/2022 1:01 PM

## 2022-12-03 LAB — URINE CULTURE: Culture: NO GROWTH

## 2022-12-04 ENCOUNTER — Telehealth: Payer: Self-pay

## 2022-12-04 NOTE — Telephone Encounter (Signed)
Attempted to reach patient regarding negative urine culture results- no answer, voicemail left for patient to call back about results.

## 2022-12-19 ENCOUNTER — Encounter: Payer: Self-pay | Admitting: *Deleted

## 2022-12-19 MED FILL — Dexamethasone Sodium Phosphate Inj 100 MG/10ML: INTRAMUSCULAR | Qty: 1 | Status: AC

## 2022-12-20 ENCOUNTER — Inpatient Hospital Stay: Payer: Self-pay

## 2022-12-20 ENCOUNTER — Inpatient Hospital Stay (HOSPITAL_BASED_OUTPATIENT_CLINIC_OR_DEPARTMENT_OTHER): Payer: Self-pay | Admitting: Adult Health

## 2022-12-20 ENCOUNTER — Inpatient Hospital Stay: Payer: Self-pay | Attending: Hematology & Oncology

## 2022-12-20 DIAGNOSIS — J45909 Unspecified asthma, uncomplicated: Secondary | ICD-10-CM | POA: Insufficient documentation

## 2022-12-20 DIAGNOSIS — C50211 Malignant neoplasm of upper-inner quadrant of right female breast: Secondary | ICD-10-CM | POA: Insufficient documentation

## 2022-12-20 DIAGNOSIS — Z5189 Encounter for other specified aftercare: Secondary | ICD-10-CM | POA: Insufficient documentation

## 2022-12-20 DIAGNOSIS — Z1721 Progesterone receptor positive status: Secondary | ICD-10-CM | POA: Insufficient documentation

## 2022-12-20 DIAGNOSIS — Z95828 Presence of other vascular implants and grafts: Secondary | ICD-10-CM

## 2022-12-20 DIAGNOSIS — Z5111 Encounter for antineoplastic chemotherapy: Secondary | ICD-10-CM | POA: Insufficient documentation

## 2022-12-20 DIAGNOSIS — Z87891 Personal history of nicotine dependence: Secondary | ICD-10-CM | POA: Insufficient documentation

## 2022-12-20 DIAGNOSIS — Z170 Estrogen receptor positive status [ER+]: Secondary | ICD-10-CM | POA: Insufficient documentation

## 2022-12-20 DIAGNOSIS — Z9013 Acquired absence of bilateral breasts and nipples: Secondary | ICD-10-CM | POA: Insufficient documentation

## 2022-12-20 LAB — PREGNANCY, URINE: Preg Test, Ur: NEGATIVE

## 2022-12-20 LAB — CBC WITH DIFFERENTIAL (CANCER CENTER ONLY)
Abs Immature Granulocytes: 0.02 10*3/uL (ref 0.00–0.07)
Basophils Absolute: 0.1 10*3/uL (ref 0.0–0.1)
Basophils Relative: 1 %
Eosinophils Absolute: 0 10*3/uL (ref 0.0–0.5)
Eosinophils Relative: 0 %
HCT: 34.9 % — ABNORMAL LOW (ref 36.0–46.0)
Hemoglobin: 11.4 g/dL — ABNORMAL LOW (ref 12.0–15.0)
Immature Granulocytes: 0 %
Lymphocytes Relative: 26 %
Lymphs Abs: 1.5 10*3/uL (ref 0.7–4.0)
MCH: 29.5 pg (ref 26.0–34.0)
MCHC: 32.7 g/dL (ref 30.0–36.0)
MCV: 90.4 fL (ref 80.0–100.0)
Monocytes Absolute: 0.8 10*3/uL (ref 0.1–1.0)
Monocytes Relative: 14 %
Neutro Abs: 3.3 10*3/uL (ref 1.7–7.7)
Neutrophils Relative %: 59 %
Platelet Count: 220 10*3/uL (ref 150–400)
RBC: 3.86 MIL/uL — ABNORMAL LOW (ref 3.87–5.11)
RDW: 15.3 % (ref 11.5–15.5)
WBC Count: 5.6 10*3/uL (ref 4.0–10.5)
nRBC: 0 % (ref 0.0–0.2)

## 2022-12-20 LAB — CMP (CANCER CENTER ONLY)
ALT: 23 U/L (ref 0–44)
AST: 13 U/L — ABNORMAL LOW (ref 15–41)
Albumin: 4.1 g/dL (ref 3.5–5.0)
Alkaline Phosphatase: 41 U/L (ref 38–126)
Anion gap: 6 (ref 5–15)
BUN: 11 mg/dL (ref 6–20)
CO2: 29 mmol/L (ref 22–32)
Calcium: 9 mg/dL (ref 8.9–10.3)
Chloride: 108 mmol/L (ref 98–111)
Creatinine: 0.59 mg/dL (ref 0.44–1.00)
GFR, Estimated: >60 mL/min (ref >60)
Glucose, Bld: 94 mg/dL (ref 70–99)
Potassium: 3.6 mmol/L (ref 3.5–5.1)
Sodium: 143 mmol/L (ref 135–145)
Total Bilirubin: 0.5 mg/dL (ref 0.3–1.2)
Total Protein: 6.4 g/dL — ABNORMAL LOW (ref 6.5–8.1)

## 2022-12-20 MED ORDER — HEPARIN SOD (PORK) LOCK FLUSH 100 UNIT/ML IV SOLN
500.0000 [IU] | Freq: Once | INTRAVENOUS | Status: AC | PRN
  Administered 2022-12-20: 500 [IU]

## 2022-12-20 MED ORDER — SODIUM CHLORIDE 0.9 % IV SOLN
600.0000 mg/m2 | Freq: Once | INTRAVENOUS | Status: AC
  Administered 2022-12-20: 1000 mg via INTRAVENOUS
  Filled 2022-12-20: qty 50

## 2022-12-20 MED ORDER — PALONOSETRON HCL INJECTION 0.25 MG/5ML
0.2500 mg | Freq: Once | INTRAVENOUS | Status: AC
  Administered 2022-12-20: 0.25 mg via INTRAVENOUS
  Filled 2022-12-20: qty 5

## 2022-12-20 MED ORDER — SODIUM CHLORIDE 0.9% FLUSH
10.0000 mL | Freq: Once | INTRAVENOUS | Status: AC
  Administered 2022-12-20: 10 mL

## 2022-12-20 MED ORDER — SODIUM CHLORIDE 0.9% FLUSH
10.0000 mL | INTRAVENOUS | Status: DC | PRN
  Administered 2022-12-20: 10 mL

## 2022-12-20 MED ORDER — SODIUM CHLORIDE 0.9 % IV SOLN: Freq: Once | INTRAVENOUS | Status: AC

## 2022-12-20 MED ORDER — SODIUM CHLORIDE 0.9 % IV SOLN
10.0000 mg | Freq: Once | INTRAVENOUS | Status: AC
  Administered 2022-12-20: 10 mg via INTRAVENOUS
  Filled 2022-12-20: qty 10

## 2022-12-20 MED ORDER — SODIUM CHLORIDE 0.9 % IV SOLN
75.0000 mg/m2 | Freq: Once | INTRAVENOUS | Status: AC
  Administered 2022-12-20: 126 mg via INTRAVENOUS
  Filled 2022-12-20: qty 12.6

## 2022-12-20 NOTE — Assessment & Plan Note (Signed)
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 Oncotype DX score 25 11/21/2022: CT CAP: No metastatic disease.  Suspected acute PE left lower lobe 11/21/2022: CT angiogram: No PE, pneumonia 11/27/2022: Bone scan: No metastatic disease.  Treatment plan: Adjuvant chemotherapy with Taxotere and Cytoxan every 3 weeks x 4 I also discussed offset clinical trial where there is randomization between complete estrogen blockade to chemotherapy Adjuvant antiestrogen therapy with complete estrogen blockade ------------------------------------------------------------------------------------------------------------------------------------ Current treatment: Cycle 3 Taxotere and Cytoxan Chemo toxicities: Tolerating well. Labs reviewed, She will proceed with treatment today without any dose reductions--so long as CMP is within parameters.   Participating in [place], study Return to clinic in 3 weeks for cycle 4 See [facility] mid-November Referral to gyn/onc surgery to discuss BSO as option compared to Goserelin.

## 2022-12-20 NOTE — Progress Notes (Signed)
[facility] Cancer Follow up:    Waynard Reeds, MD 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 Report date is 09/17/2022.  The CancerNext-Expanded gene panel offered by [facility] and includes sequencing, rearrangement, and RNA analysis for the following 71 genes: AIP, ALK, APC, ATM, AXIN2, BAP1, BARD1, BMPR1A, BRCA1, BRCA2, BRIP1, CDC73, CDH1, CDK4, CDKN1B, CDKN2A, CHEK2, CTNNA1, DICER1, FH, FLCN, KIF1B, LZTR1, MAX, MEN1, MET, MLH1, MSH2, MSH3, MSH6, MUTYH, NF1, NF2, NTHL1, PALB2, PHOX2B, PMS2, POT1, PRKAR1A, PTCH1, PTEN, RAD51C, RAD51D, RB1, RET, SDHA, SDHAF2, SDHB, SDHC, SDHD, SMAD4, SMARCA4, SMARCB1, SMARCE1, STK11, SUFU, TMEM127, TP53, TSC1, TSC2, and VHL (sequencing and deletion/duplication); EGFR, EGLN1, HOXB13, KIT, MITF, PDGFRA, POLD1, and POLE (sequencing only); EPCAM and GREM1  (deletion/duplication only).    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 Oncotype DX score 25   11/08/2022 -  Chemotherapy   Patient is on Treatment Plan : BREAST TC q21d       CURRENT THERAPY: Taxotere/Cytoxan  INTERVAL HISTORY: JULE WHITSEL 42 y.o. female returns for follow up and evaluation prior to receiving her third cycle of adjuvant chemotherapy.  She is s/p bilateral mastectomies.  She is tolerating treatment well and has no significant issues.     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  MEDICAL HISTORY: Past Medical History:  Diagnosis Date   Asthma    pregnancy only   Breast cancer (HCC) 08/21/2022   Former smoker 03/18/2006   Quit 2008   Hx of varicella     SURGICAL HISTORY: Past Surgical History:  Procedure Laterality Date   BREAST BIOPSY Right 08/21/2022   MM RT BREAST BX W LOC DEV 1ST LESION IMAGE BX SPEC STEREO GUIDE 08/21/2022 [facility]   BREAST  BIOPSY Right 08/21/2022   MM RT BREAST BX W LOC DEV EA AD LESION IMG BX SPEC STEREO GUIDE 08/21/2022 [facility]   BREAST BIOPSY Right 08/21/2022   [place] RT BREAST BX W LOC DEV 1ST LESION IMG BX SPEC US GUIDE 08/21/2022  [facility]   BREAST RECONSTRUCTION WITH PLACEMENT OF TISSUE EXPANDER AND ALLODERM Bilateral 09/17/2022   Procedure: BREAST RECONSTRUCTION WITH PLACEMENT OF TISSUE EXPANDER AND ALLODERM;  Surgeon: Glenna Fellows, MD;  Location: [facility];  Service: Plastics;  Laterality: Bilateral;   MASTECTOMY W/ SENTINEL NODE BIOPSY Right 09/17/2022   Procedure: RIGHT MASTECTOMY WITH SENTINEL LYMPH NODE BIOPSY;  Surgeon: Manus Rudd, MD;  Location: [facility];  Service: General;  Laterality: Right;   PORTACATH PLACEMENT N/A 11/07/2022   Procedure: ULTRASOUND-GUIDED PORT PLACEMENT;  Surgeon: Manus Rudd, MD;  Location: [facility];  Service: General;  Laterality: N/A;  LMA   SIMPLE MASTECTOMY WITH AXILLARY SENTINEL NODE BIOPSY Left 09/17/2022   Procedure: LEFT SIMPLE MASTECTOMY;  Surgeon: Manus Rudd, MD;  Location: [facility];  Service: General;  Laterality: Left;   WISDOM TOOTH EXTRACTION      SOCIAL HISTORY: Social History   Socioeconomic History   Marital status: Married    Spouse name: Not on file   Number of children: Not on file   Years of education: Not on file   Highest education level: Not on file  Occupational History   Not on file  Tobacco Use   Smoking status: Never    Passive exposure: Past   Smokeless tobacco: Never  Vaping Use   Vaping status: Never Used  Substance and Sexual Activity   Alcohol use: No   Drug use: No   Sexual activity: Yes    Birth control/protection: None  Other Topics Concern   Not on file  Social History Narrative   ** Merged History Encounter **       Social Determinants of Health   Financial Resource Strain: Not on file  Food Insecurity: No Food Insecurity (09/04/2022)   Hunger Vital Sign    Worried About Running Out of Food in the Last Year: Never true    Ran Out of Food in the Last Year: Never true  Transportation Needs: No Transportation Needs (09/04/2022)   PRAPARE - Scientist, research (physical sciences) (Medical): No    Lack of Transportation (Non-Medical): No  Physical Activity: Not on file  Stress: Not on file  Social Connections: Unknown (07/31/2021)   [facility]   Social Network    Social Network: Not on file  Intimate Partner Violence: Not At Risk (09/04/2022)   Humiliation, Afraid, Rape, and Kick questionnaire    Fear of Current or Ex-Partner: No    Emotionally Abused: No    Physically Abused: No    Sexually Abused: No    FAMILY HISTORY: Family History  Problem Relation Age of Onset   Breast cancer Mother 52       DCIS (-/-)   Heart disease Father    Heart attack Father    Cystic fibrosis Brother    Skin cancer Maternal Aunt 70 - 69   Breast cancer Paternal Aunt 54 - 69   Colon cancer Maternal Grandfather 68 - 75   Breast cancer Other 50 - 59    Review of Systems  Constitutional:  Negative for appetite change, chills, fatigue, fever and unexpected weight change.  HENT:   Negative for hearing  loss, lump/mass and trouble swallowing.   Eyes:  Negative for eye problems and icterus.  Respiratory:  Negative for chest tightness, cough and shortness of breath.   Cardiovascular:  Negative for chest pain, leg swelling and palpitations.  Gastrointestinal:  Negative for abdominal distention, abdominal pain, constipation, diarrhea, nausea and vomiting.  Endocrine: Negative for hot flashes.  Genitourinary:  Negative for difficulty urinating.   Musculoskeletal:  Negative for arthralgias.  Skin:  Negative for itching and rash.  Neurological:  Negative for dizziness, extremity weakness, headaches and numbness.  Hematological:  Negative for adenopathy. Does not bruise/bleed easily.  Psychiatric/Behavioral:  Negative for depression. The patient is not nervous/anxious.       PHYSICAL EXAMINATION   Onc Performance Status - 12/20/22 1011       ECOG Perf Status   ECOG Perf Status Fully active, able to carry on all pre-disease  performance without restriction      KPS SCALE   KPS % SCORE Normal, no compliants, no evidence of disease             Vitals:   12/20/22 1010  BP: 119/67  Pulse: 79  Resp: 14  Temp: 98.1 F (36.7 C)  SpO2: 100%    Physical Exam Constitutional:      General: She is not in acute distress.    Appearance: Normal appearance. She is not toxic-appearing.  HENT:     Head: Normocephalic and atraumatic.     Mouth/Throat:     Mouth: Mucous membranes are moist.     Pharynx: Oropharynx is clear. No oropharyngeal exudate or posterior oropharyngeal erythema.  Eyes:     General: No scleral icterus. Cardiovascular:     Rate and Rhythm: Normal rate and regular rhythm.     Pulses: Normal pulses.     Heart sounds: Normal heart sounds.  Pulmonary:     Effort: Pulmonary effort is normal.     Breath sounds: Normal breath sounds.  Abdominal:     General: Abdomen is flat. Bowel sounds are normal. There is no distension.     Palpations: Abdomen is soft.     Tenderness: There is no abdominal tenderness.  Musculoskeletal:        General: No swelling.     Cervical back: Neck supple.  Lymphadenopathy:     Cervical: No cervical adenopathy.  Skin:    General: Skin is warm and dry.     Findings: No rash.  Neurological:     General: No focal deficit present.     Mental Status: She is alert.  Psychiatric:        Mood and Affect: Mood normal.        Behavior: Behavior normal.     LABORATORY DATA:  CBC    Component Value Date/Time   WBC 5.6 12/20/2022 0949   WBC 7.7 11/07/2022 0948   RBC 3.86 (L) 12/20/2022 0949   HGB 11.4 (L) 12/20/2022 0949   HCT 34.9 (L) 12/20/2022 0949   PLT 220 12/20/2022 0949   MCV 90.4 12/20/2022 0949   MCH 29.5 12/20/2022 0949   MCHC 32.7 12/20/2022 0949   RDW 15.3 12/20/2022 0949   LYMPHSABS 1.5 12/20/2022 0949   MONOABS 0.8 12/20/2022 0949   EOSABS 0.0 12/20/2022 0949   BASOSABS 0.1 12/20/2022 0949    CMP     Component Value Date/Time   NA  143 12/20/2022 0949   K 3.6 12/20/2022 0949   CL 108 12/20/2022 0949   CO2 29 12/20/2022 0949  GLUCOSE 94 12/20/2022 0949   BUN 11 12/20/2022 0949   CREATININE 0.59 12/20/2022 0949   CALCIUM 9.0 12/20/2022 0949   PROT 6.4 (L) 12/20/2022 0949   ALBUMIN 4.1 12/20/2022 0949   AST 13 (L) 12/20/2022 0949   ALT 23 12/20/2022 0949   ALKPHOS 41 12/20/2022 0949   BILITOT 0.5 12/20/2022 0949   GFRNONAA >60 12/20/2022 0949   GFRAA >90 02/01/2013 0415     ASSESSMENT and THERAPY PLAN:   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 Oncotype DX score 25 11/21/2022: CT CAP: No metastatic disease.  Suspected acute PE left lower lobe 11/21/2022: CT angiogram: No PE, pneumonia 11/27/2022: Bone scan: No metastatic disease.  Treatment plan: Adjuvant chemotherapy with Taxotere and Cytoxan every 3 weeks x 4 I also discussed offset clinical trial where there is randomization between complete estrogen blockade to chemotherapy Adjuvant antiestrogen therapy with complete estrogen blockade ------------------------------------------------------------------------------------------------------------------------------------ Current treatment: Cycle 3 Taxotere and Cytoxan Chemo toxicities: Tolerating well. Labs reviewed, She will proceed with treatment today without any dose reductions--so long as CMP is within parameters.   Participating in [place], study Return to clinic in 3 weeks for cycle 4 See [facility] mid-November Referral to gyn/onc surgery to discuss BSO as option compared to Goserelin.     All questions were answered. The patient knows to call the clinic with any problems, questions or concerns. We can certainly see the patient much sooner  if necessary.  Total encounter time:20 minutes*in face-to-face visit time, chart review, lab review, care coordination, order entry, and documentation of the encounter time.    Lillard Anes, NP 12/20/22 10:42 AM Medical Oncology and Hematology [facility] [address] [place], [place] [address] Tel. [phone]    Fax. [phone]  *Total Encounter Time as defined by the Centers for Medicare and Medicaid Services includes, in addition to the face-to-face time of a patient visit (documented in the note above) non-face-to-face time: obtaining and reviewing outside history, ordering and reviewing medications, tests or procedures, care coordination (communications with other health care professionals or caregivers) and documentation in the medical record.

## 2022-12-20 NOTE — Patient Instructions (Signed)
[facility]  Discharge Instructions: Thank you for choosing [facility] to provide your oncology and hematology care.   If you have a lab appointment with the [facility], please go directly to the [facility] and check in at the registration area.   Wear comfortable clothing and clothing appropriate for easy access to any Portacath or PICC line.   We strive to give you quality time with your provider. You may need to reschedule your appointment if you arrive late (15 or more minutes).  Arriving late affects you and other patients whose appointments are after yours.  Also, if you miss three or more appointments without notifying the office, you may be dismissed from the clinic at the provider's discretion.      For prescription refill requests, have your pharmacy contact our office and allow 72 hours for refills to be completed.    Today you received the following chemotherapy and/or immunotherapy agents: Docetaxel, Cyclophosphamide      To help prevent nausea and vomiting after your treatment, we encourage you to take your nausea medication as directed.  BELOW ARE SYMPTOMS THAT SHOULD BE REPORTED IMMEDIATELY: *FEVER GREATER THAN 100.4 F (38 C) OR HIGHER *CHILLS OR SWEATING *NAUSEA AND VOMITING THAT IS NOT CONTROLLED WITH YOUR NAUSEA MEDICATION *UNUSUAL SHORTNESS OF BREATH *UNUSUAL BRUISING OR BLEEDING *URINARY PROBLEMS (pain or burning when urinating, or frequent urination) *BOWEL PROBLEMS (unusual diarrhea, constipation, pain near the anus) TENDERNESS IN MOUTH AND THROAT WITH OR WITHOUT PRESENCE OF ULCERS (sore throat, sores in mouth, or a toothache) UNUSUAL RASH, SWELLING OR PAIN  UNUSUAL VAGINAL DISCHARGE OR ITCHING   Items with * indicate a potential emergency and should be followed up as soon as possible or go to the Emergency Department if any problems should occur.  Please show the CHEMOTHERAPY ALERT CARD or IMMUNOTHERAPY  ALERT CARD at check-in to the Emergency Department and triage nurse.  Should you have questions after your visit or need to cancel or reschedule your appointment, please contact [facility]  Dept: [phone]  and follow the prompts.  Office hours are 8:00 a.m. to 4:30 p.m. Monday - Friday. Please note that voicemails left after 4:00 p.m. may not be returned until the following business day.  We are closed weekends and major holidays. You have access to a nurse at all times for urgent questions. Please call the main number to the clinic Dept: [phone] and follow the prompts.   For any non-urgent questions, you may also contact your provider using MyChart. We now offer e-Visits for anyone 25 and older to request care online for non-urgent symptoms. For details visit [email or web address].   Also download the MyChart app! Go to the app store, search "MyChart", open the app, select [facility], and log in with your MyChart username and password.

## 2022-12-23 ENCOUNTER — Inpatient Hospital Stay: Payer: Self-pay

## 2022-12-23 ENCOUNTER — Telehealth: Payer: Self-pay

## 2022-12-23 DIAGNOSIS — C50211 Malignant neoplasm of upper-inner quadrant of right female breast: Secondary | ICD-10-CM

## 2022-12-23 MED ORDER — PEGFILGRASTIM-JMDB 6 MG/0.6ML ~~LOC~~ SOSY
6.0000 mg | PREFILLED_SYRINGE | Freq: Once | SUBCUTANEOUS | Status: AC
  Administered 2022-12-23: 6 mg via SUBCUTANEOUS
  Filled 2022-12-23: qty 0.6

## 2022-12-23 NOTE — Telephone Encounter (Signed)
LVM for patient to call office regarding scheduling a new patient appointment with Dr. Alvester Morin on 10/28

## 2022-12-24 ENCOUNTER — Other Ambulatory Visit: Payer: Self-pay

## 2022-12-24 ENCOUNTER — Encounter: Payer: Self-pay | Admitting: Hematology and Oncology

## 2022-12-24 NOTE — Telephone Encounter (Signed)
Spoke with [name] regarding her referral to GYN oncology. She has an appointment scheduled with Dr. Alvester Morin on 01/13/23 at 10:30. Patient agrees to date and time. She has been provided with office address and location. She is also aware of our mask and visitor policy. Patient verbalized understanding and will call with any questions.

## 2022-12-30 ENCOUNTER — Encounter: Payer: Self-pay | Admitting: Hematology and Oncology

## 2023-01-08 ENCOUNTER — Encounter: Payer: Self-pay | Admitting: Psychiatry

## 2023-01-09 ENCOUNTER — Other Ambulatory Visit: Payer: Self-pay | Admitting: Hematology and Oncology

## 2023-01-09 ENCOUNTER — Encounter: Payer: Self-pay | Admitting: *Deleted

## 2023-01-09 DIAGNOSIS — C50211 Malignant neoplasm of upper-inner quadrant of right female breast: Secondary | ICD-10-CM

## 2023-01-10 ENCOUNTER — Inpatient Hospital Stay: Payer: Self-pay

## 2023-01-10 ENCOUNTER — Other Ambulatory Visit: Payer: Self-pay

## 2023-01-10 ENCOUNTER — Inpatient Hospital Stay (HOSPITAL_BASED_OUTPATIENT_CLINIC_OR_DEPARTMENT_OTHER): Payer: Self-pay | Admitting: Physician Assistant

## 2023-01-10 DIAGNOSIS — C50211 Malignant neoplasm of upper-inner quadrant of right female breast: Secondary | ICD-10-CM

## 2023-01-10 LAB — CBC WITH DIFFERENTIAL (CANCER CENTER ONLY)
Abs Immature Granulocytes: 0.06 10*3/uL (ref 0.00–0.07)
Basophils Absolute: 0.1 10*3/uL (ref 0.0–0.1)
Basophils Relative: 0 %
Eosinophils Absolute: 0 10*3/uL (ref 0.0–0.5)
Eosinophils Relative: 0 %
HCT: 36.7 % (ref 36.0–46.0)
Hemoglobin: 12 g/dL (ref 12.0–15.0)
Immature Granulocytes: 1 %
Lymphocytes Relative: 5 %
Lymphs Abs: 0.7 10*3/uL (ref 0.7–4.0)
MCH: 29.6 pg (ref 26.0–34.0)
MCHC: 32.7 g/dL (ref 30.0–36.0)
MCV: 90.6 fL (ref 80.0–100.0)
Monocytes Absolute: 0.2 10*3/uL (ref 0.1–1.0)
Monocytes Relative: 1 %
Neutro Abs: 11.7 10*3/uL — ABNORMAL HIGH (ref 1.7–7.7)
Neutrophils Relative %: 93 %
Platelet Count: 284 10*3/uL (ref 150–400)
RBC: 4.05 MIL/uL (ref 3.87–5.11)
RDW: 16.5 % — ABNORMAL HIGH (ref 11.5–15.5)
WBC Count: 12.6 10*3/uL — ABNORMAL HIGH (ref 4.0–10.5)
nRBC: 0 % (ref 0.0–0.2)

## 2023-01-10 LAB — CMP (CANCER CENTER ONLY)
ALT: 38 U/L (ref 0–44)
AST: 22 U/L (ref 15–41)
Albumin: 4.5 g/dL (ref 3.5–5.0)
Alkaline Phosphatase: 55 U/L (ref 38–126)
Anion gap: 6 (ref 5–15)
BUN: 17 mg/dL (ref 6–20)
CO2: 30 mmol/L (ref 22–32)
Calcium: 9.7 mg/dL (ref 8.9–10.3)
Chloride: 106 mmol/L (ref 98–111)
Creatinine: 0.59 mg/dL (ref 0.44–1.00)
GFR, Estimated: >60 mL/min (ref >60)
Glucose, Bld: 104 mg/dL — ABNORMAL HIGH (ref 70–99)
Potassium: 4.2 mmol/L (ref 3.5–5.1)
Sodium: 142 mmol/L (ref 135–145)
Total Bilirubin: 0.5 mg/dL (ref 0.3–1.2)
Total Protein: 7 g/dL (ref 6.5–8.1)

## 2023-01-10 LAB — PREGNANCY, URINE: Preg Test, Ur: NEGATIVE

## 2023-01-10 MED ORDER — PALONOSETRON HCL INJECTION 0.25 MG/5ML
0.2500 mg | Freq: Once | INTRAVENOUS | Status: AC
  Administered 2023-01-10: 0.25 mg via INTRAVENOUS
  Filled 2023-01-10: qty 5

## 2023-01-10 MED ORDER — HEPARIN SOD (PORK) LOCK FLUSH 100 UNIT/ML IV SOLN
500.0000 [IU] | Freq: Once | INTRAVENOUS | Status: AC | PRN
  Administered 2023-01-10: 500 [IU]

## 2023-01-10 MED ORDER — SODIUM CHLORIDE 0.9 % IV SOLN: Freq: Once | INTRAVENOUS | Status: AC

## 2023-01-10 MED ORDER — DEXAMETHASONE SODIUM PHOSPHATE 10 MG/ML IJ SOLN
10.0000 mg | Freq: Once | INTRAMUSCULAR | Status: AC
  Administered 2023-01-10: 10 mg via INTRAVENOUS
  Filled 2023-01-10: qty 1

## 2023-01-10 MED ORDER — CYCLOPHOSPHAMIDE CHEMO INJECTION 1 GM
600.0000 mg/m2 | Freq: Once | INTRAMUSCULAR | Status: AC
  Administered 2023-01-10: 1000 mg via INTRAVENOUS
  Filled 2023-01-10: qty 50

## 2023-01-10 MED ORDER — DOCETAXEL CHEMO INJECTION 160 MG/16ML
75.0000 mg/m2 | Freq: Once | INTRAVENOUS | Status: AC
  Administered 2023-01-10: 126 mg via INTRAVENOUS
  Filled 2023-01-10: qty 12.6

## 2023-01-10 MED ORDER — SODIUM CHLORIDE 0.9% FLUSH
10.0000 mL | INTRAVENOUS | Status: DC | PRN
  Administered 2023-01-10: 10 mL

## 2023-01-10 NOTE — Patient Instructions (Signed)
[facility]  Discharge Instructions: Thank you for choosing [facility] to provide your oncology and hematology care.   If you have a lab appointment with the Cancer Center, please go directly to the Cancer Center and check in at the registration area.   Wear comfortable clothing and clothing appropriate for easy access to any Portacath or PICC line.   We strive to give you quality time with your provider. You may need to reschedule your appointment if you arrive late (15 or more minutes).  Arriving late affects you and other patients whose appointments are after yours.  Also, if you miss three or more appointments without notifying the office, you may be dismissed from the clinic at the provider's discretion.      For prescription refill requests, have your pharmacy contact our office and allow 72 hours for refills to be completed.    Today you received the following chemotherapy and/or immunotherapy agents; Docetaxel & Cytoxan      To help prevent nausea and vomiting after your treatment, we encourage you to take your nausea medication as directed.  BELOW ARE SYMPTOMS THAT SHOULD BE REPORTED IMMEDIATELY: *FEVER GREATER THAN 100.4 F (38 C) OR HIGHER *CHILLS OR SWEATING *NAUSEA AND VOMITING THAT IS NOT CONTROLLED WITH YOUR NAUSEA MEDICATION *UNUSUAL SHORTNESS OF BREATH *UNUSUAL BRUISING OR BLEEDING *URINARY PROBLEMS (pain or burning when urinating, or frequent urination) *BOWEL PROBLEMS (unusual diarrhea, constipation, pain near the anus) TENDERNESS IN MOUTH AND THROAT WITH OR WITHOUT PRESENCE OF ULCERS (sore throat, sores in mouth, or a toothache) UNUSUAL RASH, SWELLING OR PAIN  UNUSUAL VAGINAL DISCHARGE OR ITCHING   Items with * indicate a potential emergency and should be followed up as soon as possible or go to the Emergency Department if any problems should occur.  Please show the CHEMOTHERAPY ALERT CARD or IMMUNOTHERAPY ALERT  CARD at check-in to the Emergency Department and triage nurse.  Should you have questions after your visit or need to cancel or reschedule your appointment, please contact [facility]  Dept: [phone]  and follow the prompts.  Office hours are 8:00 a.m. to 4:30 p.m. Monday - Friday. Please note that voicemails left after 4:00 p.m. may not be returned until the following business day.  We are closed weekends and major holidays. You have access to a nurse at all times for urgent questions. Please call the main number to the clinic Dept: [phone] and follow the prompts.   For any non-urgent questions, you may also contact your provider using MyChart. We now offer e-Visits for anyone 18 and older to request care online for non-urgent symptoms. For details visit [email or web address].   Also download the MyChart app! Go to the app store, search "MyChart", open the app, select , and log in with your MyChart username and password.   

## 2023-01-10 NOTE — Progress Notes (Signed)
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 Report date is 09/17/2022.  The CancerNext-Expanded gene panel offered by [facility] and includes sequencing, rearrangement, and RNA analysis for the following 71 genes: AIP, ALK, APC, ATM, AXIN2, BAP1, BARD1, BMPR1A, BRCA1, BRCA2, BRIP1, CDC73, CDH1, CDK4, CDKN1B, CDKN2A, CHEK2, CTNNA1, DICER1, FH, FLCN, KIF1B, LZTR1, MAX, MEN1, MET, MLH1, MSH2, MSH3, MSH6, MUTYH, NF1, NF2, NTHL1, PALB2, PHOX2B, PMS2, POT1, PRKAR1A, PTCH1, PTEN, RAD51C, RAD51D, RB1, RET, SDHA, SDHAF2, SDHB, SDHC, SDHD, SMAD4, SMARCA4, SMARCB1, SMARCE1, STK11, SUFU, TMEM127, TP53, TSC1, TSC2, and VHL (sequencing and deletion/duplication); EGFR, EGLN1, HOXB13, KIT, MITF, PDGFRA, POLD1, and POLE (sequencing only); EPCAM and GREM1  (deletion/duplication only).    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 Oncotype DX score 25   11/08/2022 -  Chemotherapy   Patient is on Treatment Plan : BREAST TC q21d       CURRENT THERAPY: Taxotere/Cytoxan  INTERVAL HISTORY: Rachel Singleton 42 y.o. female returns for follow up and evaluation prior to receiving Cycle 4, Day 1 of adjuvant chemotherapy.  She reports she is tolerating chemotherapy without any new or significant toxicities. She has fatigue that lasted longer this past cycle, approximately one week. She is able to complete her ADLs on her own. She denies nausea, vomiting or bowel habit changes. She denies easy bruising or signs of bleeding. She denies fevers, chills, sweats, shortness of breath ,chest pain or cough. She has no other complaints. Rest of the ROS is below.   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  MEDICAL HISTORY: Past Medical History:  Diagnosis Date   Asthma    pregnancy only   Breast cancer (HCC)  08/21/2022   Former smoker 03/18/2006   Quit 2008   Hx of varicella     SURGICAL HISTORY: Past Surgical History:  Procedure Laterality Date   BREAST BIOPSY Right  08/21/2022   MM RT BREAST BX W LOC DEV 1ST LESION IMAGE BX SPEC STEREO GUIDE 08/21/2022 [facility]   BREAST BIOPSY Right 08/21/2022   MM RT BREAST BX W LOC DEV EA AD LESION IMG BX SPEC STEREO GUIDE 08/21/2022 [facility]   BREAST BIOPSY Right 08/21/2022   [place] RT BREAST BX W LOC DEV 1ST LESION IMG BX SPEC US GUIDE 08/21/2022 [facility]   BREAST RECONSTRUCTION WITH PLACEMENT OF TISSUE EXPANDER AND ALLODERM Bilateral 09/17/2022   Procedure: BREAST RECONSTRUCTION WITH PLACEMENT OF TISSUE EXPANDER AND ALLODERM;  Surgeon: Glenna Fellows, MD;  Location: [facility];  Service: Plastics;  Laterality: Bilateral;   MASTECTOMY W/ SENTINEL NODE BIOPSY Right 09/17/2022   Procedure: RIGHT MASTECTOMY WITH SENTINEL LYMPH NODE BIOPSY;  Surgeon: Manus Rudd, MD;  Location: [facility];  Service: General;  Laterality: Right;   PORTACATH PLACEMENT N/A 11/07/2022   Procedure: ULTRASOUND-GUIDED PORT PLACEMENT;  Surgeon: Manus Rudd, MD;  Location: [facility];  Service: General;  Laterality: N/A;  LMA   SIMPLE MASTECTOMY WITH AXILLARY SENTINEL NODE BIOPSY Left 09/17/2022   Procedure: LEFT SIMPLE MASTECTOMY;  Surgeon: Manus Rudd, MD;  Location: [facility];  Service: General;  Laterality: Left;   WISDOM TOOTH EXTRACTION      SOCIAL HISTORY: Social History   Socioeconomic History   Marital status: Married    Spouse name: Not on file   Number of children: Not on file   Years of education: Not on file   Highest education level: Not on file  Occupational History   Not on file  Tobacco Use   Smoking status: Never    Passive exposure: Past   Smokeless tobacco: Never  Vaping Use   Vaping status: Never Used  Substance and Sexual Activity   Alcohol use: No   Drug use: No   Sexual activity: Yes    Birth control/protection: None  Other Topics Concern   Not on file  Social History Narrative   ** Merged History Encounter **       Social Determinants  of Health   Financial Resource Strain: Not on file  Food Insecurity: No Food Insecurity (09/04/2022)   Hunger Vital Sign    Worried About Running Out of Food in the Last Year: Never true    Ran Out of Food in the Last Year: Never true  Transportation Needs: No Transportation Needs (09/04/2022)   PRAPARE - Administrator, Civil Service (Medical): No    Lack of Transportation (Non-Medical): No  Physical Activity: Not on file  Stress: Not on file  Social Connections: Unknown (07/31/2021)   [facility]   Social Network    Social Network: Not on file  Intimate Partner Violence: Not At Risk (09/04/2022)   Humiliation, Afraid, Rape, and Kick questionnaire    Fear of Current or Ex-Partner: No    Emotionally Abused: No    Physically Abused: No    Sexually Abused: No    FAMILY HISTORY: Family History  Problem Relation Age of Onset   Breast cancer Mother 61       DCIS (-/-)   Heart disease Father    Heart attack Father    Cystic fibrosis Brother    Skin cancer Maternal Aunt  60 - 69   Breast cancer Paternal Aunt 60 - 69   Colon cancer Maternal Grandfather 70 - 75   Breast cancer Other 50 - 59    Review of Systems  Constitutional:  Positive for fatigue. Negative for appetite change, chills, fever and unexpected weight change.  HENT:   Negative for hearing loss, lump/mass and trouble swallowing.   Eyes:  Negative for eye problems and icterus.  Respiratory:  Negative for chest tightness, cough and shortness of breath.   Cardiovascular:  Negative for chest pain, leg swelling and palpitations.  Gastrointestinal:  Negative for abdominal distention, abdominal pain, constipation, diarrhea, nausea and vomiting.  Endocrine: Negative for hot flashes.  Genitourinary:  Negative for difficulty urinating.   Musculoskeletal:  Negative for arthralgias.  Skin:  Negative for itching and rash.  Neurological:  Negative for dizziness, extremity weakness,  headaches and numbness.  Hematological:  Negative for adenopathy. Does not bruise/bleed easily.  Psychiatric/Behavioral:  Negative for depression. The patient is not nervous/anxious.       PHYSICAL EXAMINATION   Vitals:   01/10/23 1045  BP: 127/82  Pulse: 91  Resp: 18  Temp: 98.2 F (36.8 C)  SpO2: 100%    Physical Exam Constitutional:      General: She is not in acute distress.    Appearance: Normal appearance. She is not toxic-appearing.  HENT:     Head: Normocephalic and atraumatic.     Mouth/Throat:     Mouth: Mucous membranes are moist.     Pharynx: Oropharynx is clear. No oropharyngeal exudate or posterior oropharyngeal erythema.  Eyes:     General: No scleral icterus. Cardiovascular:     Rate and Rhythm: Normal rate and regular rhythm.     Heart sounds: Normal heart sounds.  Pulmonary:     Effort: Pulmonary effort is normal.     Breath sounds: Normal breath sounds.  Musculoskeletal:        General: No swelling.     Cervical back: Neck supple.  Skin:    General: Skin is warm and dry.     Findings: No rash.  Neurological:     General: No focal deficit present.     Mental Status: She is alert.  Psychiatric:        Mood and Affect: Mood normal.        Behavior: Behavior normal.     LABORATORY DATA:  CBC    Component Value Date/Time   WBC 12.6 (H) 01/10/2023 1023   WBC 7.7 11/07/2022 0948   RBC 4.05 01/10/2023 1023   HGB 12.0 01/10/2023 1023   HCT 36.7 01/10/2023 1023   PLT 284 01/10/2023 1023   MCV 90.6 01/10/2023 1023   MCH 29.6 01/10/2023 1023   MCHC 32.7 01/10/2023 1023   RDW 16.5 (H) 01/10/2023 1023   LYMPHSABS 0.7 01/10/2023 1023   MONOABS 0.2 01/10/2023 1023   EOSABS 0.0 01/10/2023 1023   BASOSABS 0.1 01/10/2023 1023    CMP     Component Value Date/Time   NA 142 01/10/2023 1023   K 4.2 01/10/2023 1023   CL 106 01/10/2023 1023   CO2 30 01/10/2023 1023   GLUCOSE 104 (H) 01/10/2023 1023   BUN 17 01/10/2023 1023   CREATININE 0.59  01/10/2023 1023   CALCIUM 9.7 01/10/2023 1023   PROT 7.0 01/10/2023 1023   ALBUMIN 4.5 01/10/2023 1023   AST 22 01/10/2023 1023   ALT 38 01/10/2023 1023   ALKPHOS 55 01/10/2023 1023   BILITOT  0.5 01/10/2023 1023   GFRNONAA >60 01/10/2023 1023   GFRAA >90 02/01/2013 0415     ASSESSMENT and THERAPY PLAN:  Rachel Singleton is a 42 y.o. female who returns for a follow up for continued management of right breast cancer.   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 Left mastectomy: ADH. 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 --Oncotype DX score 25 --11/21/2022: CT CAP: No metastatic disease.  Suspected acute PE left lower lobe --11/21/2022: CT angiogram: No PE, pneumonia --11/27/2022: Bone scan: No metastatic disease. --Treatment plan includes adjuvant chemotherapy with taxotere plus cytoxan q 3 weeks x 4 cycles, adjuvant antiestrogen therapy with complete estrogen blockade.  PLAN: --Due to start Cycle 4, Day 1 of Taxotere and Cytoxan today --Labs from today were reviewed and adequate for treatment. WBC 12.6, Hgb 12.0, Plt 284. Creatinine and LFTs are normal. --Proceed with treatment today without any dose modifications --RTC on 01/31/2023 with Dr. Pamelia Hoit   All questions were answered. The patient knows to call the clinic with any problems, questions or concerns. We can certainly see the patient much sooner if necessary.   I have spent a total of 30 minutes minutes of face-to-face and non-face-to-face time, preparing to see the patient, performing a medically appropriate examination, counseling and educating the patient,  documenting clinical information in the electronic health record, independently interpreting results and communicating results to the patient, and care  coordination.   Georga Kaufmann PA-C Dept of Hematology and Oncology [facility] at [facility] Phone: [phone]

## 2023-01-13 ENCOUNTER — Encounter: Payer: Self-pay | Admitting: *Deleted

## 2023-01-13 ENCOUNTER — Inpatient Hospital Stay (HOSPITAL_BASED_OUTPATIENT_CLINIC_OR_DEPARTMENT_OTHER): Payer: Self-pay | Admitting: Psychiatry

## 2023-01-13 ENCOUNTER — Encounter: Payer: Self-pay | Admitting: Hematology and Oncology

## 2023-01-13 ENCOUNTER — Inpatient Hospital Stay: Payer: Self-pay

## 2023-01-13 ENCOUNTER — Encounter: Payer: Self-pay | Admitting: Psychiatry

## 2023-01-13 VITALS — BP 120/67 | HR 99 | Temp 98.8°F | Resp 18

## 2023-01-13 VITALS — BP 114/73 | HR 92 | Resp 20 | Ht 67.0 in | Wt 129.4 lb

## 2023-01-13 DIAGNOSIS — C50919 Malignant neoplasm of unspecified site of unspecified female breast: Secondary | ICD-10-CM

## 2023-01-13 DIAGNOSIS — C50211 Malignant neoplasm of upper-inner quadrant of right female breast: Secondary | ICD-10-CM

## 2023-01-13 DIAGNOSIS — Z170 Estrogen receptor positive status [ER+]: Secondary | ICD-10-CM

## 2023-01-13 DIAGNOSIS — Z1721 Progesterone receptor positive status: Secondary | ICD-10-CM

## 2023-01-13 DIAGNOSIS — D259 Leiomyoma of uterus, unspecified: Secondary | ICD-10-CM

## 2023-01-13 MED ORDER — PEGFILGRASTIM-JMDB 6 MG/0.6ML ~~LOC~~ SOSY
6.0000 mg | PREFILLED_SYRINGE | Freq: Once | SUBCUTANEOUS | Status: AC
  Administered 2023-01-13: 6 mg via SUBCUTANEOUS

## 2023-01-13 NOTE — Patient Instructions (Signed)
It was a pleasure to see you in clinic today. - We discussed robotic assisted bilateral salpingo-oophorectomy for hormone suppression - We will get a pelvic ultrasound for preop planning based on my review of your CT scan - Let us know if you would like to move forward and we will help select a surgery date. We will get you a preop appointment with our team prior to that date when scheduled.  Thank you very much for allowing me to provide care for you today.  I appreciate your confidence in choosing our Gynecologic Oncology team at [facility].  If you have any questions about your visit today please call our office or send [place] a MyChart message and we will get back to you as soon as possible.

## 2023-01-13 NOTE — H&P (View-Only) (Signed)
GYNECOLOGIC ONCOLOGY NEW PATIENT CONSULTATION  Date of Service: 01/13/2023 Referring Provider: Lillard Anes, NP   ASSESSMENT AND PLAN: Rachel Singleton is a 42 y.o. woman with ER/PR+ breast cancer who presents for discussion of therapeutic BSO.  A long discussion was held with the patient regarding the rationale for therapeutic removal of the ovaries. She has an estrogen receptor positive breast cancer and her medical oncologist has discussion consideration of surgical ablation of ovarian function. If no BSO, she would be recommended to undergo GNRH agonist for ovarian suppression while on antiestrogen therapy. Surgical risks were also reviewed.  Based on her imaging and exam, her uterus may be slightly enlarged. But pt is asymptomatic. Hysterectomy not necessary for hormone reduction. Could be consider for pathology. Will get pelvic ultrasound to further evaluate preoperatively.  Patient is leaning toward BSO. She has her breast reconstruction (implant placement) scheduled for 02/28/23. She wishes to contact Dr. Earmon [place] office first prior to officially confirming desire to proceed with BSO.   Patient was consented for: Robotic assisted laparoscopic bilateral salpingo-oophorectomy on TBD.  The risks of surgery were discussed in detail and she understands these to including but not limited to bleeding requiring a blood transfusion, infection, injury to adjacent organs (including but not limited to the bowels, bladder, ureters, nerves, blood vessels), thromboembolic events, wound separation, hernia, unforseen complication, and possible need for re-exploration.  If the patient experiences any of these events, she understands that her hospitalization or recovery may be prolonged and that she may need to take additional medications for a prolonged period. The patient will receive DVT and antibiotic prophylaxis as indicated. She voiced a clear understanding. She had the opportunity to ask questions  and informed consent was obtained today. She wishes to proceed.  She does not require preoperative clearance. Her METs are >4.   Once date confirmed, we will have pt return for an RN/NP preop visit.   A copy of this note was sent to the patient's referring provider.  Clide Cliff, MD Gynecologic Oncology   Medical Decision Making I personally spent  TOTAL 45 minutes face-to-face and non-face-to-face in the care of this patient, which includes all pre, intra, and post visit time on the date of service.  ------------  CC: ER/PR+ breast cancer  HISTORY OF PRESENT ILLNESS:  Rachel Singleton is a 42 y.o. woman who is seen in consultation at the request of Lillard Anes, NP for evaluation of consideration for BSO in the setting of ER/PR+ breast cancer.  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  This was diagnosed in June 2024.  She underwent bilateral mastectomies in July 2024.  She has subsequently been treated with chemotherapy (Taxotere/Cytoxan) since August 2024.  She was also counseled on adjuvant antiestrogen therapy.  In the setting of this she was referred to gynecology for consideration of BSO.  She was suspected to have an acute PE in the left lower lobe on CT staging imaging on 11/21/2022, but CT angiogram showed no PE.  She has undergone germline genetic testing which only showed a POLE VUS.  No pathogenic mutations.  Today patient reports that she has completed her chemotherapy.  Her periods stopped since undergoing chemotherapy.  She reports that she has been counseled on antiestrogen therapy for at least 5 years and may be 10 years.  She otherwise denies pelvic pain or change in bowel or bladder habits.  She reports that her menstrual cycle was not particularly heavy when it  was occurring.   TREATMENT HISTORY: Oncology History  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)  09/01/2022 Initial Diagnosis    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 Report date is 09/17/2022.  The CancerNext-Expanded gene panel offered by [facility] and includes sequencing, rearrangement, and RNA analysis for the following 71 genes: AIP, ALK, APC, ATM, AXIN2, BAP1, BARD1, BMPR1A, BRCA1, BRCA2, BRIP1, CDC73, CDH1, CDK4, CDKN1B, CDKN2A, CHEK2, CTNNA1, DICER1, FH, FLCN, KIF1B, LZTR1, MAX, MEN1, MET, MLH1, MSH2, MSH3, MSH6, MUTYH, NF1, NF2, NTHL1, PALB2, PHOX2B, PMS2, POT1, PRKAR1A, PTCH1, PTEN, RAD51C, RAD51D, RB1, RET, SDHA, SDHAF2, SDHB, SDHC, SDHD, SMAD4, SMARCA4, SMARCB1, SMARCE1, STK11, SUFU, TMEM127, TP53, TSC1, TSC2, and VHL (sequencing and deletion/duplication); EGFR, EGLN1, HOXB13, KIT, MITF, PDGFRA, POLD1, and POLE (sequencing only); EPCAM and GREM1 (deletion/duplication only).    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 Oncotype DX score 25   11/08/2022 -  Chemotherapy   Patient is on Treatment Plan : BREAST TC q21d       PAST MEDICAL HISTORY: Past Medical History:  Diagnosis Date   Asthma    pregnancy only   Breast cancer (HCC) 08/21/2022   Former smoker 03/18/2006   Quit 2008   Hx of varicella     PAST SURGICAL HISTORY: Past Surgical History:  Procedure Laterality Date   BREAST BIOPSY  Right 08/21/2022   MM RT BREAST BX W LOC DEV 1ST LESION IMAGE BX SPEC STEREO GUIDE 08/21/2022 [facility]   BREAST BIOPSY Right 08/21/2022   MM RT BREAST BX W LOC DEV EA AD LESION IMG BX SPEC STEREO GUIDE 08/21/2022 [facility]   BREAST BIOPSY Right 08/21/2022   [place] RT BREAST BX W LOC DEV 1ST LESION IMG BX SPEC US GUIDE 08/21/2022 [facility]   BREAST RECONSTRUCTION WITH PLACEMENT OF TISSUE EXPANDER AND ALLODERM Bilateral 09/17/2022   Procedure: BREAST RECONSTRUCTION WITH PLACEMENT OF TISSUE EXPANDER AND ALLODERM;  Surgeon: Glenna Fellows, MD;  Location: [facility];  Service: Plastics;  Laterality: Bilateral;   MASTECTOMY W/ SENTINEL NODE BIOPSY Right 09/17/2022   Procedure: RIGHT MASTECTOMY WITH SENTINEL LYMPH NODE BIOPSY;  Surgeon: Manus Rudd, MD;  Location: [facility];  Service: General;  Laterality: Right;   PORTACATH PLACEMENT N/A 11/07/2022   Procedure: ULTRASOUND-GUIDED PORT PLACEMENT;  Surgeon: Manus Rudd, MD;  Location: [facility];  Service: General;  Laterality: N/A;  LMA   SIMPLE MASTECTOMY WITH AXILLARY SENTINEL NODE BIOPSY Left 09/17/2022   Procedure: LEFT SIMPLE MASTECTOMY;  Surgeon: Manus Rudd, MD;  Location: [facility];  Service: General;  Laterality: Left;   WISDOM TOOTH EXTRACTION      OB/GYN HISTORY: OB History  Gravida Para Term Preterm AB Living  5 4 4  0 0 4  SAB IAB Ectopic Multiple Live Births  0 0 0   4    #  Outcome Date GA Lbr Len/2nd Weight Sex Type Anes PTL Lv  5 Term 06/01/14 [redacted]w[redacted]d 02:01 / 00:52 7 lb 7.2 oz (3.38 kg) M Vag-Spont EPI  LIV  4 Term 05/08/12 [redacted]w[redacted]d 01:32 / 00:05 7 lb 9.2 oz (3.435 kg) F Vag-Spont EPI  LIV  3 Term 07/2010 [redacted]w[redacted]d 10:00 8 lb 4 oz (3.742 kg) F Vag-Spont EPI N LIV  2 Term 08/2008 [redacted]w[redacted]d 18:00 7 lb 14 oz (3.572 kg) F Vag-Spont EPI N LIV  1 Term      Vag-Spont   LIV      Age at menarche: 6 Age at menopause: periods recently stopped with chemo Hx of HRT: no Hx of STI:  no Last pap: 2019 Nest[facility] Ramp) History of abnormal pap smears: no  SCREENING STUDIES:  Last mammogram: 08/2022 Last colonoscopy: none  MEDICATIONS:  Current Outpatient Medications:    dexamethasone (DECADRON) 4 MG tablet, Take 1 tablet day before chemo and 1 tablet day after chemo with food, Disp: 8 tablet, Rfl: 0   lidocaine-prilocaine (EMLA) cream, Apply to affected area once, Disp: 30 g, Rfl: 3   ondansetron (ZOFRAN) 8 MG tablet, Take 1 tablet (8 mg total) by mouth every 8 (eight) hours as needed for nausea or vomiting. Start on the third day after chemotherapy., Disp: 30 tablet, Rfl: 1   prochlorperazine (COMPAZINE) 10 MG tablet, Take 1 tablet (10 mg total) by mouth every 6 (six) hours as needed for nausea or vomiting., Disp: 30 tablet, Rfl: 1   Ascorbic Acid (VITAMIN C) 1000 MG tablet, Take 1,000 mg by mouth daily. (Patient not taking: Reported on 01/08/2023), Disp: , Rfl:    b complex vitamins capsule, Take 1 capsule by mouth daily. (Patient not taking: Reported on 01/08/2023), Disp: , Rfl:    Calcium Citrate-Vitamin D (CALCIUM + D PO), Take 1 tablet by mouth daily. (Patient not taking: Reported on 01/08/2023), Disp: , Rfl:    magnesium 30 MG tablet, Take 30 mg by mouth 2 (two) times daily. (Patient not taking: Reported on 01/08/2023), Disp: , Rfl:    OVER THE COUNTER MEDICATION, Take 1 capsule by mouth daily. EstroCleanse (Patient not taking: Reported on 01/08/2023), Disp: , Rfl:    OVER THE COUNTER MEDICATION, Take 1 capsule by mouth daily. Sulforaphane (Patient not taking: Reported on 01/08/2023), Disp: , Rfl:    QUERCETIN PO, Take 1 tablet by mouth daily. (Patient not taking: Reported on 01/08/2023), Disp: , Rfl:    Turmeric 400 MG CAPS, Take 400 mg by mouth daily. (Patient not taking: Reported on 01/08/2023), Disp: , Rfl:    Zinc 30 MG CAPS, Take 30 mg by mouth daily. (Patient not taking: Reported on 01/08/2023), Disp: , Rfl:   ALLERGIES: Allergies  Allergen Reactions    Aluminum-Containing Compounds Hives and Rash    FAMILY HISTORY: Family History  Problem Relation Age of Onset   Breast cancer Mother 33       DCIS (-/-)   Heart disease Father    Heart attack Father    Cystic fibrosis Brother    Colon cancer Maternal Grandfather 19 - 75   Skin cancer Maternal Aunt 60 - 69   Breast cancer Paternal Aunt 90 - 69   Breast cancer Other 50 - 59   Ovarian cancer Neg Hx    Endometrial cancer Neg Hx     SOCIAL HISTORY: Social History   Socioeconomic History   Marital status: Married    Spouse name: Not on file   Number of  children: Not on file   Years of education: Not on file   Highest education level: Not on file  Occupational History   Not on file  Tobacco Use   Smoking status: Former    Types: Cigarettes    Passive exposure: Past   Smokeless tobacco: Never  Vaping Use   Vaping status: Never Used  Substance and Sexual Activity   Alcohol use: No   Drug use: No   Sexual activity: Yes    Birth control/protection: None  Other Topics Concern   Not on file  Social History Narrative   ** Merged History Encounter **       Social Determinants of Health   Financial Resource Strain: Not on file  Food Insecurity: No Food Insecurity (09/04/2022)   Hunger Vital Sign    Worried About Running Out of Food in the Last Year: Never true    Ran Out of Food in the Last Year: Never true  Transportation Needs: No Transportation Needs (09/04/2022)   PRAPARE - Administrator, Civil Service (Medical): No    Lack of Transportation (Non-Medical): No  Physical Activity: Not on file  Stress: Not on file  Social Connections: Unknown (07/31/2021)   [facility]   Social Network    Social Network: Not on file  Intimate Partner Violence: Not At Risk (09/04/2022)   Humiliation, Afraid, Rape, and Kick questionnaire    Fear of Current or Ex-Partner: No    Emotionally Abused: No    Physically Abused: No    Sexually  Abused: No    REVIEW OF SYSTEMS: New patient intake form was reviewed.  Complete 10-system review is negative except for the following: non3  PHYSICAL EXAM: BP 114/73 (BP Location: Left Arm, Patient Position: Sitting)   Pulse 92   Resp 20   Ht 5\' 7"  (1.702 m)   Wt 129 lb 6.4 oz (58.7 kg)   SpO2 99%   BMI 20.27 kg/m  Constitutional: No acute distress. Neuro/Psych: Alert, oriented.  Head and Neck: Normocephalic, atraumatic. Neck symmetric without masses. Sclera anicteric. Port in place Respiratory: Normal work of breathing. Clear to auscultation bilaterally. Cardiovascular: Regular rate and rhythm, no murmurs, rubs, or gallops. Abdomen: Normoactive bowel sounds. Soft, non-distended, non-tender to palpation. No masses appreciated.  Extremities: Grossly normal range of motion. Warm, well perfused. No edema bilaterally. Skin: No rashes or lesions. Lymphatic: No cervical, supraclavicular, or inguinal adenopathy. Genitourinary: External genitalia without lesions. Urethral meatus without lesions or prolapse. On speculum exam, vagina and cervix without lesions. Cervix deviated anteriorly Bimanual exam reveals normal cervix. Retroverted uterus, mild enlarged of uterine fundus in cul-de-sac. No adnexal mass appreciated. Exam chaperoned by Warner Mccreedy, NP   LABORATORY AND RADIOLOGIC DATA: Outside medical records were reviewed to synthesize the above history, along with the history and physical obtained during the visit.  Outside laboratory, pathology, and imaging reports were reviewed, with pertinent results below.  I personally reviewed the outside images.  WBC  Date Value Ref Range Status  11/07/2022 7.7 4.0 - 10.5 K/uL Final   WBC Count  Date Value Ref Range Status  01/10/2023 12.6 (H) 4.0 - 10.5 K/uL Final   Hemoglobin  Date Value Ref Range Status  01/10/2023 12.0 12.0 - 15.0 g/dL Final   HCT  Date Value Ref Range Status  01/10/2023 36.7 36.0 - 46.0 % Final   Platelet Count   Date Value Ref Range Status  01/10/2023 284 150 - 400 K/uL Final  Creatinine  Date Value Ref Range Status  01/10/2023 0.59 0.44 - 1.00 mg/dL Final   AST  Date Value Ref Range Status  01/10/2023 22 15 - 41 U/L Final   ALT  Date Value Ref Range Status  01/10/2023 38 0 - 44 U/L Final    CT CHEST ABDOMEN PELVIS W CONTRAST 11/21/2022  Narrative CLINICAL DATA:  Elevated tumor marker. Breast carcinoma. * Tracking Code: BO *  EXAM: CT CHEST, ABDOMEN, AND PELVIS WITH CONTRAST  TECHNIQUE: Multidetector CT imaging of the chest, abdomen and pelvis was performed following the standard protocol during bolus administration of intravenous contrast.  RADIATION DOSE REDUCTION: This exam was performed according to the departmental dose-optimization program which includes automated exposure control, adjustment of the mA and/or kV according to patient size and/or use of iterative reconstruction technique.  CONTRAST:  80mL OMNIPAQUE IOHEXOL 300 MG/ML  SOLN  COMPARISON:  None Available.  FINDINGS: CT CHEST FINDINGS  Cardiovascular: Potential filling defect within the proximal LEFT lower lobe pulmonary artery (image 214/3. Potential filling defect in the lingular pulmonary artery on image 219/3. study is not timed for specific evaluation of pulmonary arteries; however findings are suggestive of acute pulmonary embolism.  No additional acute cardiac findings.  Mediastinum/Nodes: No axillary or supraclavicular adenopathy. No mediastinal or hilar adenopathy. No pericardial fluid. Esophagus normal. Is  Lungs/Pleura: Focus of segmental nodularity and consolidation in the medial RIGHT lower lobe appears 120/4)  Musculoskeletal: Bilateral mastectomy and breast expander devices.  CT ABDOMEN AND PELVIS FINDINGS  Hepatobiliary: No focal hepatic lesion. No biliary ductal dilatation. Gallbladder is normal. Common bile duct is normal.  Pancreas: Pancreas is normal. No ductal dilatation.  No pancreatic inflammation.  Spleen: Normal spleen  Adrenals/urinary tract: Adrenal glands and kidneys are normal. The ureters and bladder normal.  Stomach/Bowel: Stomach, small bowel, appendix, and cecum are normal. The colon and rectosigmoid colon are normal.  Vascular/Lymphatic: Abdominal aorta is normal caliber. There is no retroperitoneal or periportal lymphadenopathy. No pelvic lymphadenopathy.  Reproductive: Uterus and adnexa unremarkable.  Other: No peritoneal disease  Musculoskeletal: No aggressive osseous lesion.  IMPRESSION: CHEST:  1. Suspected acute pulmonary embolism in the LEFT lower lobe and lingular pulmonary arteries. Study is not timed for specific evaluation of the pulmonary arteries; however findings are suggestive of acute pulmonary embolism. Recommend dedicated CT PA of the chest (PE protocol) for confirmation 2. Focus of segmental nodularity and consolidation in the medial RIGHT lower lobe is concerning for small focus of pneumonia. 3. No evidence of metastatic disease.  PELVIS:  No evidence of breast carcinoma metastatic disease.  Findings conveyed toVINAY GUDENA on 11/21/2022  at11:58.   Electronically Signed By: Genevive Bi M.D. On: 11/21/2022 12:00

## 2023-01-13 NOTE — Progress Notes (Signed)
GYNECOLOGIC ONCOLOGY NEW PATIENT CONSULTATION  Date of Service: 01/13/2023 Referring Provider: Lillard Anes, NP   ASSESSMENT AND PLAN: Rachel Singleton is a 42 y.o. woman with ER/PR+ breast cancer who presents for discussion of therapeutic BSO.  A long discussion was held with the patient regarding the rationale for therapeutic removal of the ovaries. She has an estrogen receptor positive breast cancer and her medical oncologist has discussion consideration of surgical ablation of ovarian function. If no BSO, she would be recommended to undergo GNRH agonist for ovarian suppression while on antiestrogen therapy. Surgical risks were also reviewed.  Based on her imaging and exam, her uterus may be slightly enlarged. But pt is asymptomatic. Hysterectomy not necessary for hormone reduction. Could be consider for pathology. Will get pelvic ultrasound to further evaluate preoperatively.  Patient is leaning toward BSO. She has her breast reconstruction (implant placement) scheduled for 02/28/23. She wishes to contact Dr. Earmon Phoenix office first prior to officially confirming desire to proceed with BSO.   Patient was consented for: Robotic assisted laparoscopic bilateral salpingo-oophorectomy on TBD.  The risks of surgery were discussed in detail and she understands these to including but not limited to bleeding requiring a blood transfusion, infection, injury to adjacent organs (including but not limited to the bowels, bladder, ureters, nerves, blood vessels), thromboembolic events, wound separation, hernia, unforseen complication, and possible need for re-exploration.  If the patient experiences any of these events, she understands that her hospitalization or recovery may be prolonged and that she may need to take additional medications for a prolonged period. The patient will receive DVT and antibiotic prophylaxis as indicated. She voiced a clear understanding. She had the opportunity to ask questions  and informed consent was obtained today. She wishes to proceed.  She does not require preoperative clearance. Her METs are >4.   Once date confirmed, we will have pt return for an RN/NP preop visit.   A copy of this note was sent to the patient's referring provider.  Clide Cliff, MD Gynecologic Oncology   Medical Decision Making I personally spent  TOTAL 45 minutes face-to-face and non-face-to-face in the care of this patient, which includes all pre, intra, and post visit time on the date of service.  ------------  CC: ER/PR+ breast cancer  HISTORY OF PRESENT ILLNESS:  Rachel Singleton is a 42 y.o. woman who is seen in consultation at the request of Lillard Anes, NP for evaluation of consideration for BSO in the setting of ER/PR+ breast cancer.  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  This was diagnosed in June 2024.  She underwent bilateral mastectomies in July 2024.  She has subsequently been treated with chemotherapy (Taxotere/Cytoxan) since August 2024.  She was also counseled on adjuvant antiestrogen therapy.  In the setting of this she was referred to gynecology for consideration of BSO.  She was suspected to have an acute PE in the left lower lobe on CT staging imaging on 11/21/2022, but CT angiogram showed no PE.  She has undergone germline genetic testing which only showed a POLE VUS.  No pathogenic mutations.  Today patient reports that she has completed her chemotherapy.  Her periods stopped since undergoing chemotherapy.  She reports that she has been counseled on antiestrogen therapy for at least 5 years and may be 10 years.  She otherwise denies pelvic pain or change in bowel or bladder habits.  She reports that her menstrual cycle was not particularly heavy when it  was occurring.   TREATMENT HISTORY: Oncology History  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)  09/01/2022 Initial Diagnosis    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 Report date is 09/17/2022.  The CancerNext-Expanded gene panel offered by [facility] and includes sequencing, rearrangement, and RNA analysis for the following 71 genes: AIP, ALK, APC, ATM, AXIN2, BAP1, BARD1, BMPR1A, BRCA1, BRCA2, BRIP1, CDC73, CDH1, CDK4, CDKN1B, CDKN2A, CHEK2, CTNNA1, DICER1, FH, FLCN, KIF1B, LZTR1, MAX, MEN1, MET, MLH1, MSH2, MSH3, MSH6, MUTYH, NF1, NF2, NTHL1, PALB2, PHOX2B, PMS2, POT1, PRKAR1A, PTCH1, PTEN, RAD51C, RAD51D, RB1, RET, SDHA, SDHAF2, SDHB, SDHC, SDHD, SMAD4, SMARCA4, SMARCB1, SMARCE1, STK11, SUFU, TMEM127, TP53, TSC1, TSC2, and VHL (sequencing and deletion/duplication); EGFR, EGLN1, HOXB13, KIT, MITF, PDGFRA, POLD1, and POLE (sequencing only); EPCAM and GREM1 (deletion/duplication only).    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 Oncotype DX score 25   11/08/2022 -  Chemotherapy   Patient is on Treatment Plan : BREAST TC q21d       PAST MEDICAL HISTORY: Past Medical History:  Diagnosis Date   Asthma    pregnancy only   Breast cancer (HCC) 08/21/2022   Former smoker 03/18/2006   Quit 2008   Hx of varicella     PAST SURGICAL HISTORY: Past Surgical History:  Procedure Laterality Date   BREAST BIOPSY  Right 08/21/2022   MM RT BREAST BX W LOC DEV 1ST LESION IMAGE BX SPEC STEREO GUIDE 08/21/2022 [facility]   BREAST BIOPSY Right 08/21/2022   MM RT BREAST BX W LOC DEV EA AD LESION IMG BX SPEC STEREO GUIDE 08/21/2022 [facility]   BREAST BIOPSY Right 08/21/2022   [place] RT BREAST BX W LOC DEV 1ST LESION IMG BX SPEC US GUIDE 08/21/2022 [facility]   BREAST RECONSTRUCTION WITH PLACEMENT OF TISSUE EXPANDER AND ALLODERM Bilateral 09/17/2022   Procedure: BREAST RECONSTRUCTION WITH PLACEMENT OF TISSUE EXPANDER AND ALLODERM;  Surgeon: Glenna Fellows, MD;  Location: [facility];  Service: Plastics;  Laterality: Bilateral;   MASTECTOMY W/ SENTINEL NODE BIOPSY Right 09/17/2022   Procedure: RIGHT MASTECTOMY WITH SENTINEL LYMPH NODE BIOPSY;  Surgeon: Manus Rudd, MD;  Location: [facility];  Service: General;  Laterality: Right;   PORTACATH PLACEMENT N/A 11/07/2022   Procedure: ULTRASOUND-GUIDED PORT PLACEMENT;  Surgeon: Manus Rudd, MD;  Location: [facility];  Service: General;  Laterality: N/A;  LMA   SIMPLE MASTECTOMY WITH AXILLARY SENTINEL NODE BIOPSY Left 09/17/2022   Procedure: LEFT SIMPLE MASTECTOMY;  Surgeon: Manus Rudd, MD;  Location: [facility];  Service: General;  Laterality: Left;   WISDOM TOOTH EXTRACTION      OB/GYN HISTORY: OB History  Gravida Para Term Preterm AB Living  5 4 4  0 0 4  SAB IAB Ectopic Multiple Live Births  0 0 0   4    #  Outcome Date GA Lbr Len/2nd Weight Sex Type Anes PTL Lv  5 Term 06/01/14 [redacted]w[redacted]d 02:01 / 00:52 7 lb 7.2 oz (3.38 kg) M Vag-Spont EPI  LIV  4 Term 05/08/12 [redacted]w[redacted]d 01:32 / 00:05 7 lb 9.2 oz (3.435 kg) F Vag-Spont EPI  LIV  3 Term 07/2010 [redacted]w[redacted]d 10:00 8 lb 4 oz (3.742 kg) F Vag-Spont EPI N LIV  2 Term 08/2008 [redacted]w[redacted]d 18:00 7 lb 14 oz (3.572 kg) F Vag-Spont EPI N LIV  1 Term      Vag-Spont   LIV      Age at menarche: 81 Age at menopause: periods recently stopped with chemo Hx of HRT: no Hx of STI:  no Last pap: 2019 Nestor Ramp) History of abnormal pap smears: no  SCREENING STUDIES:  Last mammogram: 08/2022 Last colonoscopy: none  MEDICATIONS:  Current Outpatient Medications:    dexamethasone (DECADRON) 4 MG tablet, Take 1 tablet day before chemo and 1 tablet day after chemo with food, Disp: 8 tablet, Rfl: 0   lidocaine-prilocaine (EMLA) cream, Apply to affected area once, Disp: 30 g, Rfl: 3   ondansetron (ZOFRAN) 8 MG tablet, Take 1 tablet (8 mg total) by mouth every 8 (eight) hours as needed for nausea or vomiting. Start on the third day after chemotherapy., Disp: 30 tablet, Rfl: 1   prochlorperazine (COMPAZINE) 10 MG tablet, Take 1 tablet (10 mg total) by mouth every 6 (six) hours as needed for nausea or vomiting., Disp: 30 tablet, Rfl: 1   Ascorbic Acid (VITAMIN C) 1000 MG tablet, Take 1,000 mg by mouth daily. (Patient not taking: Reported on 01/08/2023), Disp: , Rfl:    b complex vitamins capsule, Take 1 capsule by mouth daily. (Patient not taking: Reported on 01/08/2023), Disp: , Rfl:    Calcium Citrate-Vitamin D (CALCIUM + D PO), Take 1 tablet by mouth daily. (Patient not taking: Reported on 01/08/2023), Disp: , Rfl:    magnesium 30 MG tablet, Take 30 mg by mouth 2 (two) times daily. (Patient not taking: Reported on 01/08/2023), Disp: , Rfl:    OVER THE COUNTER MEDICATION, Take 1 capsule by mouth daily. EstroCleanse (Patient not taking: Reported on 01/08/2023), Disp: , Rfl:    OVER THE COUNTER MEDICATION, Take 1 capsule by mouth daily. Sulforaphane (Patient not taking: Reported on 01/08/2023), Disp: , Rfl:    QUERCETIN PO, Take 1 tablet by mouth daily. (Patient not taking: Reported on 01/08/2023), Disp: , Rfl:    Turmeric 400 MG CAPS, Take 400 mg by mouth daily. (Patient not taking: Reported on 01/08/2023), Disp: , Rfl:    Zinc 30 MG CAPS, Take 30 mg by mouth daily. (Patient not taking: Reported on 01/08/2023), Disp: , Rfl:   ALLERGIES: Allergies  Allergen Reactions    Aluminum-Containing Compounds Hives and Rash    FAMILY HISTORY: Family History  Problem Relation Age of Onset   Breast cancer Mother 86       DCIS (-/-)   Heart disease Father    Heart attack Father    Cystic fibrosis Brother    Colon cancer Maternal Grandfather 78 - 75   Skin cancer Maternal Aunt 60 - 69   Breast cancer Paternal Aunt 73 - 69   Breast cancer Other 50 - 59   Ovarian cancer Neg Hx    Endometrial cancer Neg Hx     SOCIAL HISTORY: Social History   Socioeconomic History   Marital status: Married    Spouse name: Not on file   Number of  children: Not on file   Years of education: Not on file   Highest education level: Not on file  Occupational History   Not on file  Tobacco Use   Smoking status: Former    Types: Cigarettes    Passive exposure: Past   Smokeless tobacco: Never  Vaping Use   Vaping status: Never Used  Substance and Sexual Activity   Alcohol use: No   Drug use: No   Sexual activity: Yes    Birth control/protection: None  Other Topics Concern   Not on file  Social History Narrative   ** Merged History Encounter **       Social Determinants of Health   Financial Resource Strain: Not on file  Food Insecurity: No Food Insecurity (09/04/2022)   Hunger Vital Sign    Worried About Running Out of Food in the Last Year: Never true    Ran Out of Food in the Last Year: Never true  Transportation Needs: No Transportation Needs (09/04/2022)   PRAPARE - Administrator, Civil Service (Medical): No    Lack of Transportation (Non-Medical): No  Physical Activity: Not on file  Stress: Not on file  Social Connections: Unknown (07/31/2021)   [facility]   Social Network    Social Network: Not on file  Intimate Partner Violence: Not At Risk (09/04/2022)   Humiliation, Afraid, Rape, and Kick questionnaire    Fear of Current or Ex-Partner: No    Emotionally Abused: No    Physically Abused: No    Sexually  Abused: No    REVIEW OF SYSTEMS: New patient intake form was reviewed.  Complete 10-system review is negative except for the following: non3  PHYSICAL EXAM: BP 114/73 (BP Location: Left Arm, Patient Position: Sitting)   Pulse 92   Resp 20   Ht 5\' 7"  (1.702 m)   Wt 129 lb 6.4 oz (58.7 kg)   SpO2 99%   BMI 20.27 kg/m  Constitutional: No acute distress. Neuro/Psych: Alert, oriented.  Head and Neck: Normocephalic, atraumatic. Neck symmetric without masses. Sclera anicteric. Port in place Respiratory: Normal work of breathing. Clear to auscultation bilaterally. Cardiovascular: Regular rate and rhythm, no murmurs, rubs, or gallops. Abdomen: Normoactive bowel sounds. Soft, non-distended, non-tender to palpation. No masses appreciated.  Extremities: Grossly normal range of motion. Warm, well perfused. No edema bilaterally. Skin: No rashes or lesions. Lymphatic: No cervical, supraclavicular, or inguinal adenopathy. Genitourinary: External genitalia without lesions. Urethral meatus without lesions or prolapse. On speculum exam, vagina and cervix without lesions. Cervix deviated anteriorly Bimanual exam reveals normal cervix. Retroverted uterus, mild enlarged of uterine fundus in cul-de-sac. No adnexal mass appreciated. Exam chaperoned by Warner Mccreedy, NP   LABORATORY AND RADIOLOGIC DATA: Outside medical records were reviewed to synthesize the above history, along with the history and physical obtained during the visit.  Outside laboratory, pathology, and imaging reports were reviewed, with pertinent results below.  I personally reviewed the outside images.  WBC  Date Value Ref Range Status  11/07/2022 7.7 4.0 - 10.5 K/uL Final   WBC Count  Date Value Ref Range Status  01/10/2023 12.6 (H) 4.0 - 10.5 K/uL Final   Hemoglobin  Date Value Ref Range Status  01/10/2023 12.0 12.0 - 15.0 g/dL Final   HCT  Date Value Ref Range Status  01/10/2023 36.7 36.0 - 46.0 % Final   Platelet Count   Date Value Ref Range Status  01/10/2023 284 150 - 400 K/uL Final  Creatinine  Date Value Ref Range Status  01/10/2023 0.59 0.44 - 1.00 mg/dL Final   AST  Date Value Ref Range Status  01/10/2023 22 15 - 41 U/L Final   ALT  Date Value Ref Range Status  01/10/2023 38 0 - 44 U/L Final    CT CHEST ABDOMEN PELVIS W CONTRAST 11/21/2022  Narrative CLINICAL DATA:  Elevated tumor marker. Breast carcinoma. * Tracking Code: BO *  EXAM: CT CHEST, ABDOMEN, AND PELVIS WITH CONTRAST  TECHNIQUE: Multidetector CT imaging of the chest, abdomen and pelvis was performed following the standard protocol during bolus administration of intravenous contrast.  RADIATION DOSE REDUCTION: This exam was performed according to the departmental dose-optimization program which includes automated exposure control, adjustment of the mA and/or kV according to patient size and/or use of iterative reconstruction technique.  CONTRAST:  80mL OMNIPAQUE IOHEXOL 300 MG/ML  SOLN  COMPARISON:  None Available.  FINDINGS: CT CHEST FINDINGS  Cardiovascular: Potential filling defect within the proximal LEFT lower lobe pulmonary artery (image 214/3. Potential filling defect in the lingular pulmonary artery on image 219/3. study is not timed for specific evaluation of pulmonary arteries; however findings are suggestive of acute pulmonary embolism.  No additional acute cardiac findings.  Mediastinum/Nodes: No axillary or supraclavicular adenopathy. No mediastinal or hilar adenopathy. No pericardial fluid. Esophagus normal. Is  Lungs/Pleura: Focus of segmental nodularity and consolidation in the medial RIGHT lower lobe appears 120/4)  Musculoskeletal: Bilateral mastectomy and breast expander devices.  CT ABDOMEN AND PELVIS FINDINGS  Hepatobiliary: No focal hepatic lesion. No biliary ductal dilatation. Gallbladder is normal. Common bile duct is normal.  Pancreas: Pancreas is normal. No ductal dilatation.  No pancreatic inflammation.  Spleen: Normal spleen  Adrenals/urinary tract: Adrenal glands and kidneys are normal. The ureters and bladder normal.  Stomach/Bowel: Stomach, small bowel, appendix, and cecum are normal. The colon and rectosigmoid colon are normal.  Vascular/Lymphatic: Abdominal aorta is normal caliber. There is no retroperitoneal or periportal lymphadenopathy. No pelvic lymphadenopathy.  Reproductive: Uterus and adnexa unremarkable.  Other: No peritoneal disease  Musculoskeletal: No aggressive osseous lesion.  IMPRESSION: CHEST:  1. Suspected acute pulmonary embolism in the LEFT lower lobe and lingular pulmonary arteries. Study is not timed for specific evaluation of the pulmonary arteries; however findings are suggestive of acute pulmonary embolism. Recommend dedicated CT PA of the chest (PE protocol) for confirmation 2. Focus of segmental nodularity and consolidation in the medial RIGHT lower lobe is concerning for small focus of pneumonia. 3. No evidence of metastatic disease.  PELVIS:  No evidence of breast carcinoma metastatic disease.  Findings conveyed toVINAY GUDENA on 11/21/2022  at11:58.   Electronically Signed By: Genevive Bi M.D. On: 11/21/2022 12:00

## 2023-01-13 NOTE — Progress Notes (Signed)
Received mychart message from pt stating she would like to proceed with bilateral oophorectomy with Dr. Alvester Morin and also have her port removed during her surgery with Dr. Leta Baptist.  Per MD okay to proceed with both surgeries.  RN sent in basket to Dr. Alvester Morin and called Dr. Maude Leriche office and spoke to nurse for verbal okay.

## 2023-01-14 ENCOUNTER — Ambulatory Visit: Payer: Self-pay | Admitting: Surgery

## 2023-01-15 ENCOUNTER — Ambulatory Visit (HOSPITAL_COMMUNITY)
Admission: RE | Admit: 2023-01-15 | Discharge: 2023-01-15 | Disposition: A | Discharge status: Home / Self Care | Payer: Self-pay | Source: Ambulatory Visit | Attending: Psychiatry | Admitting: Psychiatry

## 2023-01-15 DIAGNOSIS — D259 Leiomyoma of uterus, unspecified: Secondary | ICD-10-CM | POA: Insufficient documentation

## 2023-01-16 ENCOUNTER — Other Ambulatory Visit: Payer: Self-pay

## 2023-01-27 ENCOUNTER — Encounter: Payer: Self-pay | Admitting: Hematology and Oncology

## 2023-01-27 ENCOUNTER — Telehealth: Payer: Self-pay | Admitting: *Deleted

## 2023-01-27 NOTE — Telephone Encounter (Signed)
-----   Message from Doylene Bode sent at 01/27/2023 10:55 AM EST ----- Based on OR availability, Dec 3 is not going to work as a surgery date. There is an opening on February 11, 2023 or the next date after that would be December 17. If we get a cancellation, we will reach out to see if she would like that date.

## 2023-01-27 NOTE — Telephone Encounter (Signed)
Attempted to reach patient in regards to OR availability. Left voicemail requesting call back.

## 2023-01-27 NOTE — Telephone Encounter (Signed)
Spoke with Ms. Meinders and relayed message from Warner Mccreedy, NP based on OR availability, Dec 3 is not going to work as a surgery date. There is an opening on November 26 or the next date after that would be December 17th. If we get a cancellation, we will reach out to see if that date works better.   Patient states she will take the November 26 th. Date. No further concerns or questions at this time. Advised patient that [facility] Pre admission testing will be calling to set up an appointment. Patient thanked the office for calling.

## 2023-01-31 ENCOUNTER — Other Ambulatory Visit: Payer: Self-pay | Admitting: Gynecologic Oncology

## 2023-01-31 ENCOUNTER — Inpatient Hospital Stay: Payer: Self-pay | Attending: Hematology & Oncology | Admitting: Hematology and Oncology

## 2023-01-31 VITALS — BP 120/68 | HR 91 | Temp 97.3°F | Resp 18 | Ht 67.0 in | Wt 131.3 lb

## 2023-01-31 DIAGNOSIS — C50211 Malignant neoplasm of upper-inner quadrant of right female breast: Secondary | ICD-10-CM | POA: Insufficient documentation

## 2023-01-31 DIAGNOSIS — Z1721 Progesterone receptor positive status: Secondary | ICD-10-CM

## 2023-01-31 DIAGNOSIS — Z170 Estrogen receptor positive status [ER+]: Secondary | ICD-10-CM | POA: Insufficient documentation

## 2023-01-31 DIAGNOSIS — Z9221 Personal history of antineoplastic chemotherapy: Secondary | ICD-10-CM | POA: Insufficient documentation

## 2023-01-31 DIAGNOSIS — Z79811 Long term (current) use of aromatase inhibitors: Secondary | ICD-10-CM | POA: Insufficient documentation

## 2023-01-31 DIAGNOSIS — Z9013 Acquired absence of bilateral breasts and nipples: Secondary | ICD-10-CM | POA: Insufficient documentation

## 2023-01-31 MED ORDER — ANASTROZOLE 1 MG PO TABS: 1.0000 mg | ORAL_TABLET | Freq: Every day | ORAL | 3 refills | Status: DC

## 2023-01-31 NOTE — Assessment & Plan Note (Signed)
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 Oncotype DX score 25 11/21/2022: CT CAP: No metastatic disease.  Suspected acute PE left lower lobe 11/21/2022: CT angiogram: No PE, pneumonia 11/27/2022: Bone scan: No metastatic disease.   Treatment plan: Adjuvant chemotherapy with Taxotere and Cytoxan every 3 weeks x 4 completed 01/10/2023 I also discussed offset clinical trial where there is randomization between complete estrogen blockade to chemotherapy Adjuvant antiestrogen therapy with complete estrogen blockade ------------------------------------------------------------------------------------------------------------------------------------ Positive Signatera test:  CT CAP 11/21/2022: Suspected acute PE (CT angiogram chest: No PE).  No metastatic disease Bone scan 11/27/2022: Benign  Treatment plan: Adjuvant antiestrogen therapy with complete estrogen blockade with combination of Zoladex with anastrozole

## 2023-01-31 NOTE — Progress Notes (Signed)
Patient Care Team: Rachel James, MD as PCP - General (Family Medicine) Donnelly Angelica, RN as Oncology Nurse Navigator Pershing Proud, RN as Oncology Nurse Navigator Serena Croissant, MD as Consulting Physician (Hematology and Oncology) Manus Rudd, MD as Consulting Physician (General Surgery) Dorothy Puffer, MD as Consulting Physician (Radiation Oncology)  DIAGNOSIS:  Encounter Diagnosis  Name Primary?   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 Report date is 09/17/2022.  The CancerNext-Expanded gene panel offered by [facility] and includes sequencing, rearrangement, and RNA analysis for the following 71 genes: AIP, ALK, APC, ATM, AXIN2, BAP1, BARD1, BMPR1A, BRCA1, BRCA2, BRIP1, CDC73, CDH1, CDK4, CDKN1B, CDKN2A, CHEK2, CTNNA1, DICER1, FH, FLCN, KIF1B, LZTR1, MAX, MEN1, MET, MLH1, MSH2, MSH3, MSH6, MUTYH, NF1, NF2, NTHL1, PALB2, PHOX2B, PMS2, POT1, PRKAR1A, PTCH1, PTEN, RAD51C, RAD51D, RB1, RET, SDHA, SDHAF2, SDHB, SDHC, SDHD, SMAD4, SMARCA4, SMARCB1, SMARCE1, STK11, SUFU, TMEM127, TP53, TSC1, TSC2, and VHL (sequencing and deletion/duplication); EGFR, EGLN1, HOXB13, KIT, MITF, PDGFRA, POLD1, and POLE (sequencing only); EPCAM and GREM1 (deletion/duplication  only).    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 Oncotype DX score 25   11/08/2022 -  Chemotherapy   Patient is on Treatment Plan : BREAST TC q21d       CHIEF COMPLIANT: Follow-up to discuss antiestrogen treatment plan  HISTORY OF PRESENT ILLNESS:   History of Present Illness   The patient, a premenopausal individual with a recent history of breast cancer, presents three weeks post-chemotherapy. She reports feeling "pretty good" overall, but notes persistent fatigue and muscle soreness, which she attributes to the Taxotere component of her chemotherapy regimen. The patient expresses concern about these symptoms, questioning if they are normal post-chemotherapy effects.  The patient also discusses her upcoming treatment plan, which includes hormone therapy and removal of the ovaries. She expresses understanding of the benefits of ovarian function suppression and is scheduled for oophorectomy in the near future.  The patient expresses interest in potential preventative measures, including the use of supplements and lifestyle modifications. She specifically inquires about the use of ivermectin and fenbendazole, and expresses a desire to maintain a healthy diet and exercise regimen. The patient also discusses concerns about environmental estrogens and their potential impact on her condition.         ALLERGIES:  is allergic to aluminum-containing compounds.  MEDICATIONS:  Current Outpatient Medications  Medication Sig Dispense Refill   anastrozole (ARIMIDEX) 1 MG tablet Take 1 tablet (1 mg total) by mouth daily. 90 tablet 3   Ascorbic Acid (VITAMIN C) 1000 MG tablet Take 1,000 mg by mouth daily. (Patient not taking: Reported on 01/08/2023)     b complex  vitamins capsule Take 1  capsule by mouth daily. (Patient not taking: Reported on 01/08/2023)     Calcium Citrate-Vitamin D (CALCIUM + D PO) Take 1 tablet by mouth daily. (Patient not taking: Reported on 01/08/2023)     dexamethasone (DECADRON) 4 MG tablet Take 1 tablet day before chemo and 1 tablet day after chemo with food 8 tablet 0   lidocaine-prilocaine (EMLA) cream Apply to affected area once 30 g 3   magnesium 30 MG tablet Take 30 mg by mouth 2 (two) times daily. (Patient not taking: Reported on 01/08/2023)     ondansetron (ZOFRAN) 8 MG tablet Take 1 tablet (8 mg total) by mouth every 8 (eight) hours as needed for nausea or vomiting. Start on the third day after chemotherapy. 30 tablet 1   OVER THE COUNTER MEDICATION Take 1 capsule by mouth daily. EstroCleanse (Patient not taking: Reported on 01/08/2023)     OVER THE COUNTER MEDICATION Take 1 capsule by mouth daily. Sulforaphane (Patient not taking: Reported on 01/08/2023)     prochlorperazine (COMPAZINE) 10 MG tablet Take 1 tablet (10 mg total) by mouth every 6 (six) hours as needed for nausea or vomiting. 30 tablet 1   QUERCETIN PO Take 1 tablet by mouth daily. (Patient not taking: Reported on 01/08/2023)     Turmeric 400 MG CAPS Take 400 mg by mouth daily. (Patient not taking: Reported on 01/08/2023)     Zinc 30 MG CAPS Take 30 mg by mouth daily. (Patient not taking: Reported on 01/08/2023)     No current facility-administered medications for this visit.    PHYSICAL EXAMINATION: ECOG PERFORMANCE STATUS: 1 - Symptomatic but completely ambulatory  Vitals:   01/31/23 1100  BP: 120/68  Pulse: 91  Resp: 18  Temp: (!) 97.3 F (36.3 C)  SpO2: 100%   Filed Weights   01/31/23 1100  Weight: 131 lb 4.8 oz (59.6 kg)      LABORATORY DATA:  I have reviewed the data as listed    Latest Ref Rng & Units 01/10/2023   10:23 AM 12/20/2022    9:49 AM 11/29/2022   10:31 AM  CMP  Glucose 70 - 99 mg/dL 086  94  97   BUN 6 - 20 mg/dL 17  11  11    Creatinine 0.44 -  1.00 mg/dL 5.78  4.69  6.29   Sodium 135 - 145 mmol/L 142  143  141   Potassium 3.5 - 5.1 mmol/L 4.2  3.6  4.0   Chloride 98 - 111 mmol/L 106  108  105   CO2 22 - 32 mmol/L 30  29  29    Calcium 8.9 - 10.3 mg/dL 9.7  9.0  9.3   Total Protein 6.5 - 8.1 g/dL 7.0  6.4  7.1   Total Bilirubin 0.3 - 1.2 mg/dL 0.5  0.5  0.5   Alkaline Phos 38 - 126 U/L 55  41  47   AST 15 - 41 U/L 22  13  11    ALT 0 - 44 U/L 38  23  16     Lab Results  Component Value Date   WBC 12.6 (H) 01/10/2023   HGB 12.0 01/10/2023   HCT 36.7 01/10/2023   MCV 90.6 01/10/2023   PLT 284 01/10/2023   NEUTROABS 11.7 (H) 01/10/2023    ASSESSMENT & PLAN:  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 Oncotype DX score 25 11/21/2022: CT CAP: No metastatic disease.  Suspected acute PE left lower lobe 11/21/2022: CT angiogram: No PE, pneumonia 11/27/2022: Bone scan: No metastatic disease.   Treatment plan: Adjuvant chemotherapy with Taxotere and Cytoxan every 3 weeks x 4 completed 01/10/2023  Adjuvant antiestrogen therapy with anastrozole (oophorectomy planned for 02/11/2023) ------------------------------------------------------------------------------------------------------------------------------------ Positive Signatera test:  CT CAP 11/21/2022: Suspected acute PE (CT angiogram chest: No PE).  No metastatic disease Bone scan 11/27/2022: Benign    Assessment and Plan    Breast Cancer Post-Chemotherapy Completed chemotherapy three weeks ago. Reports proximal muscle weakness and soreness, likely due to Taxotere. -Expect symptoms to improve over time.  Premenopausal Hormone Therapy Discussed ovarian function suppression for improved outcomes. Oophorectomy  scheduled for February 11, 2023. -Start Anastrozole 1mg  daily after recovery from oophorectomy, around February 25, 2023. -Continue Anastrozole for seven years.  Bone Health Risk of osteoporosis due to lack of estrogen. -Advise patient to take Vitamin D3 2000 international units per day. -Encourage dietary calcium intake. -Check bone density every two years. -Encourage weight-bearing exercises to improve bone density.  Lipid Health Potential for increased cholesterol levels with aromatase inhibitors. -Monitor cholesterol levels with primary care provider.  Vaginal Health Potential for vaginal dryness due to lack of estrogen. -Advise use of coconut oil for moisturization. -Consider use of topical estrogen creams if necessary.  Breast Cancer Surveillance Discussed potential use of Signatera test for minimal residual disease detection. -Plan to perform Signatera test every six months once a negative result is obtained. -Consider addition of Ribociclib if patient qualifies and is willing after further discussion in three months.  Follow-up -See nurse practitioner Lillia Abed in three months for survivorship care plan visit. -Consider Ribociclib addition at that time if patient qualifies and is willing. -Perform Signatera test before next visit.       No orders of the defined types were placed in this encounter.  The patient has a good understanding of the overall plan. she agrees with it. she will call with any problems that may develop before the next visit here. Total time spent: 30 mins including face to face time and time spent for planning, charting and co-ordination of care   Tamsen Meek, MD 01/31/23

## 2023-02-02 ENCOUNTER — Other Ambulatory Visit: Payer: Self-pay

## 2023-02-03 ENCOUNTER — Encounter (HOSPITAL_BASED_OUTPATIENT_CLINIC_OR_DEPARTMENT_OTHER): Payer: Self-pay | Admitting: Surgery

## 2023-02-05 NOTE — Progress Notes (Signed)
Sent patient text message to notify that she has a pre-surgical soap to pick up today between 7am-3pm at [facility]. Callback number included for any questions.

## 2023-02-06 ENCOUNTER — Other Ambulatory Visit: Payer: Self-pay

## 2023-02-06 ENCOUNTER — Ambulatory Visit (HOSPITAL_BASED_OUTPATIENT_CLINIC_OR_DEPARTMENT_OTHER): Payer: Self-pay | Admitting: Anesthesiology

## 2023-02-06 ENCOUNTER — Encounter (HOSPITAL_BASED_OUTPATIENT_CLINIC_OR_DEPARTMENT_OTHER): Payer: Self-pay | Admitting: Surgery

## 2023-02-06 ENCOUNTER — Ambulatory Visit (HOSPITAL_BASED_OUTPATIENT_CLINIC_OR_DEPARTMENT_OTHER)
Admission: RE | Admit: 2023-02-06 | Discharge: 2023-02-06 | Disposition: A | Discharge status: Home / Self Care | Payer: Self-pay | Source: Ambulatory Visit | Attending: Surgery | Admitting: Surgery

## 2023-02-06 ENCOUNTER — Encounter (HOSPITAL_BASED_OUTPATIENT_CLINIC_OR_DEPARTMENT_OTHER)
Admission: RE | Disposition: A | Discharge status: Home / Self Care | Payer: Self-pay | Source: Ambulatory Visit | Attending: Surgery

## 2023-02-06 DIAGNOSIS — Z9221 Personal history of antineoplastic chemotherapy: Secondary | ICD-10-CM | POA: Insufficient documentation

## 2023-02-06 DIAGNOSIS — Z95828 Presence of other vascular implants and grafts: Secondary | ICD-10-CM

## 2023-02-06 DIAGNOSIS — Z853 Personal history of malignant neoplasm of breast: Secondary | ICD-10-CM | POA: Insufficient documentation

## 2023-02-06 DIAGNOSIS — Z9013 Acquired absence of bilateral breasts and nipples: Secondary | ICD-10-CM | POA: Insufficient documentation

## 2023-02-06 DIAGNOSIS — Z452 Encounter for adjustment and management of vascular access device: Secondary | ICD-10-CM

## 2023-02-06 DIAGNOSIS — Z87891 Personal history of nicotine dependence: Secondary | ICD-10-CM | POA: Insufficient documentation

## 2023-02-06 HISTORY — PX: PORT-A-CATH REMOVAL: SHX5289

## 2023-02-06 LAB — POCT PREGNANCY, URINE: Preg Test, Ur: NEGATIVE

## 2023-02-06 SURGERY — REMOVAL PORT-A-CATH
Anesthesia: Monitor Anesthesia Care

## 2023-02-06 MED ORDER — MIDAZOLAM HCL 5 MG/5ML IJ SOLN
INTRAMUSCULAR | Status: DC | PRN
  Administered 2023-02-06: 2 mg via INTRAVENOUS

## 2023-02-06 MED ORDER — ACETAMINOPHEN 500 MG PO TABS: 1000.0000 mg | ORAL_TABLET | ORAL | Status: AC

## 2023-02-06 MED ORDER — ACETAMINOPHEN 500 MG PO TABS
ORAL_TABLET | ORAL | Status: AC
  Filled 2023-02-06: qty 2

## 2023-02-06 MED ORDER — BUPIVACAINE-EPINEPHRINE 0.25% -1:200000 IJ SOLN
INTRAMUSCULAR | Status: DC | PRN
  Administered 2023-02-06: 6 mL

## 2023-02-06 MED ORDER — MIDAZOLAM HCL 2 MG/2ML IJ SOLN
INTRAMUSCULAR | Status: AC
  Filled 2023-02-06: qty 2

## 2023-02-06 MED ORDER — LACTATED RINGERS IV SOLN: INTRAVENOUS | Status: DC

## 2023-02-06 MED ORDER — ONDANSETRON HCL 4 MG/2ML IJ SOLN
INTRAMUSCULAR | Status: DC | PRN
  Administered 2023-02-06: 4 mg via INTRAVENOUS

## 2023-02-06 MED ORDER — CHLORHEXIDINE GLUCONATE CLOTH 2 % EX PADS: 6.0000 | MEDICATED_PAD | Freq: Once | CUTANEOUS | Status: DC

## 2023-02-06 MED ORDER — ACETAMINOPHEN 500 MG PO TABS
1000.0000 mg | ORAL_TABLET | Freq: Once | ORAL | Status: AC
  Administered 2023-02-06: 1000 mg via ORAL

## 2023-02-06 MED ORDER — ONDANSETRON HCL 4 MG/2ML IJ SOLN
INTRAMUSCULAR | Status: AC
  Filled 2023-02-06: qty 2

## 2023-02-06 MED ORDER — FENTANYL CITRATE (PF) 100 MCG/2ML IJ SOLN: 25.0000 ug | INTRAMUSCULAR | Status: DC | PRN

## 2023-02-06 MED ORDER — PROPOFOL 10 MG/ML IV BOLUS
INTRAVENOUS | Status: DC | PRN
Start: 2023-02-06 — End: 2023-02-06
  Administered 2023-02-06: 30 mg via INTRAVENOUS
  Administered 2023-02-06: 20 mg via INTRAVENOUS

## 2023-02-06 MED ORDER — PROPOFOL 10 MG/ML IV BOLUS
INTRAVENOUS | Status: AC
  Filled 2023-02-06: qty 20

## 2023-02-06 MED ORDER — PROPOFOL 500 MG/50ML IV EMUL
INTRAVENOUS | Status: DC | PRN
  Administered 2023-02-06: 75 ug/kg/min via INTRAVENOUS

## 2023-02-06 MED ORDER — SODIUM CHLORIDE 0.9 % IV SOLN: INTRAVENOUS | Status: DC | PRN

## 2023-02-06 MED ORDER — FENTANYL CITRATE (PF) 100 MCG/2ML IJ SOLN
INTRAMUSCULAR | Status: DC | PRN
  Administered 2023-02-06: 50 ug via INTRAVENOUS

## 2023-02-06 MED ORDER — FENTANYL CITRATE (PF) 100 MCG/2ML IJ SOLN
INTRAMUSCULAR | Status: AC
  Filled 2023-02-06: qty 2

## 2023-02-06 SURGICAL SUPPLY — 35 items
APPLICATOR COTTON TIP 6 STRL (MISCELLANEOUS) IMPLANT
APPLICATOR COTTON TIP 6IN STRL (MISCELLANEOUS)
BENZOIN TINCTURE PRP APPL 2/3 (GAUZE/BANDAGES/DRESSINGS) ×1 IMPLANT
BLADE HEX COATED 2.75 (ELECTRODE) IMPLANT
BLADE SURG 15 STRL LF DISP TIS (BLADE) ×1 IMPLANT
CANISTER SUCT 1200ML W/VALVE (MISCELLANEOUS) IMPLANT
CHLORAPREP W/TINT 26 (MISCELLANEOUS) ×1 IMPLANT
COVER BACK TABLE 60X90IN (DRAPES) ×1 IMPLANT
COVER MAYO STAND STRL (DRAPES) ×1 IMPLANT
DRAPE LAPAROTOMY 100X72 PEDS (DRAPES) ×1 IMPLANT
DRAPE UTILITY XL STRL (DRAPES) ×1 IMPLANT
DRSG TEGADERM 2-3/8X2-3/4 SM (GAUZE/BANDAGES/DRESSINGS) IMPLANT
DRSG TEGADERM 4X4.75 (GAUZE/BANDAGES/DRESSINGS) IMPLANT
ELECT REM PT RETURN 9FT ADLT (ELECTROSURGICAL) ×1
ELECTRODE REM PT RTRN 9FT ADLT (ELECTROSURGICAL) ×1 IMPLANT
GAUZE SPONGE 2X2 STRL 8-PLY (GAUZE/BANDAGES/DRESSINGS) IMPLANT
GAUZE SPONGE 4X4 12PLY STRL LF (GAUZE/BANDAGES/DRESSINGS) IMPLANT
GLOVE BIO SURGEON STRL SZ7 (GLOVE) ×1 IMPLANT
GLOVE BIOGEL PI IND STRL 7.5 (GLOVE) ×1 IMPLANT
GOWN STRL REUS W/ TWL LRG LVL3 (GOWN DISPOSABLE) ×2 IMPLANT
NDL HYPO 25X1 1.5 SAFETY (NEEDLE) ×1 IMPLANT
NEEDLE HYPO 25X1 1.5 SAFETY (NEEDLE) ×1
NS IRRIG 1000ML POUR BTL (IV SOLUTION) IMPLANT
PACK BASIN DAY SURGERY FS (CUSTOM PROCEDURE TRAY) ×1 IMPLANT
PENCIL SMOKE EVACUATOR (MISCELLANEOUS) ×1 IMPLANT
SPIKE FLUID TRANSFER (MISCELLANEOUS) IMPLANT
SPONGE T-LAP 4X18 ~~LOC~~+RFID (SPONGE) ×1 IMPLANT
STRIP CLOSURE SKIN 1/2X4 (GAUZE/BANDAGES/DRESSINGS) ×1 IMPLANT
SUT MON AB 4-0 PC3 18 (SUTURE) ×1 IMPLANT
SUT VIC AB 3-0 SH 27X BRD (SUTURE) ×1 IMPLANT
SYR BULB EAR ULCER 3OZ GRN STR (SYRINGE) IMPLANT
SYR CONTROL 10ML LL (SYRINGE) ×1 IMPLANT
TOWEL GREEN STERILE FF (TOWEL DISPOSABLE) ×2 IMPLANT
TUBE CONNECTING 20X1/4 (TUBING) IMPLANT
YANKAUER SUCT BULB TIP NO VENT (SUCTIONS) IMPLANT

## 2023-02-06 NOTE — Patient Instructions (Addendum)
SURGICAL WAITING ROOM VISITATION  Patients having surgery or a procedure may have no more than 2 support people in the waiting area - these visitors may rotate.    Children under the age of 4 must have an adult with them who is not the patient.  Due to an increase in RSV and influenza rates and associated hospitalizations, children ages 86 and under may not visit patients in [facility] hospitals.  If the patient needs to stay at the hospital during part of their recovery, the visitor guidelines for inpatient rooms apply. Pre-op nurse will coordinate an appropriate time for 1 support person to accompany patient in pre-op.  This support person may not rotate.    Please refer to the [facility] website for the visitor guidelines for Inpatients (after your surgery is over and you are in a regular room).    Your procedure is scheduled on: 02/11/23   Report to [facility] Main Entrance    Report to admitting at 11:45 AM   Call this number if you have problems the morning of surgery [phone]   Do not eat food :After Midnight.   After Midnight you may have the following liquids until 11:00 AM DAY OF SURGERY  Water Non-Citrus Juices (without pulp, NO RED-Apple, White grape, White cranberry) Black Coffee (NO MILK/CREAM OR CREAMERS, sugar ok)  Clear Tea (NO MILK/CREAM OR CREAMERS, sugar ok) regular and decaf                             Plain Jell-O (NO RED)                                           Fruit ices (not with fruit pulp, NO RED)                                     Popsicles (NO RED)                                                               Sports drinks like Gatorade (NO RED)                     If you have questions, please contact your surgeon's office.   FOLLOW BOWEL PREP AND ANY ADDITIONAL PRE OP INSTRUCTIONS YOU RECEIVED FROM YOUR SURGEON'S OFFICE!!!     Oral Hygiene is also important to reduce your risk of infection.                                     Remember - BRUSH YOUR TEETH THE MORNING OF SURGERY WITH YOUR REGULAR TOOTHPASTE  DENTURES WILL BE REMOVED PRIOR TO SURGERY PLEASE DO NOT APPLY "Poly grip" OR ADHESIVES!!!   Do NOT smoke after Midnight   Stop all vitamins and herbal supplements 7 days before surgery.   Take these medicines the morning of surgery with A SIP OF WATER: Zofran, Compazine   DO NOT TAKE ANY ORAL  DIABETIC MEDICATIONS DAY OF YOUR SURGERY  Bring CPAP mask and tubing day of surgery.                              You may not have any metal on your body including hair pins, jewelry, and body piercing             Do not wear make-up, lotions, powders, perfumes, or deodorant  Do not wear nail polish including gel and S&S, artificial/acrylic nails, or any other type of covering on natural nails including finger and toenails. If you have artificial nails, gel coating, etc. that needs to be removed by a nail salon please have this removed prior to surgery or surgery may need to be canceled/ delayed if the surgeon/ anesthesia feels like they are unable to be safely monitored.   Do not shave  48 hours prior to surgery.    Do not bring valuables to the hospital. [facility] IS NOT             RESPONSIBLE   FOR VALUABLES.   Contacts, glasses, dentures or bridgework may not be worn into surgery.   Bring small overnight bag day of surgery.   DO NOT BRING YOUR HOME MEDICATIONS TO THE HOSPITAL. PHARMACY WILL DISPENSE MEDICATIONS LISTED ON YOUR MEDICATION LIST TO YOU DURING YOUR ADMISSION IN THE HOSPITAL!    Patients discharged on the day of surgery will not be allowed to drive home.  Someone NEEDS to stay with you for the first 24 hours after anesthesia.   Special Instructions: Bring a copy of your healthcare power of attorney and living will documents the day of surgery if you haven't scanned them before.              Please read over the following fact sheets you were given: IF YOU HAVE QUESTIONS ABOUT YOUR PRE-OP  INSTRUCTIONS PLEASE CALL [name]    If you received a COVID test during your pre-op visit  it is requested that you wear a mask when out in public, stay away from anyone that may not be feeling well and notify your surgeon if you develop symptoms. If you test positive for Covid or have been in contact with anyone that has tested positive in the last 10 days please notify you surgeon.    [place] - Preparing for Surgery Before surgery, you can play an important role.  Because skin is not sterile, your skin needs to be as free of germs as possible.  You can reduce the number of germs on your skin by washing with CHG (chlorahexidine gluconate) soap before surgery.  CHG is an antiseptic cleaner which kills germs and bonds with the skin to continue killing germs even after washing. Please DO NOT use if you have an allergy to CHG or antibacterial soaps.  If your skin becomes reddened/irritated stop using the CHG and inform your nurse when you arrive at Short Stay. Do not shave (including legs and underarms) for at least 48 hours prior to the first CHG shower.  You may shave your face/neck.  Please follow these instructions carefully:  1.  Shower with CHG Soap the night before surgery and the  morning of surgery.  2.  If you choose to wash your hair, wash your hair first as usual with your normal  shampoo.  3.  After you shampoo, rinse your hair and body thoroughly to remove the shampoo.  4.  Use CHG as you would any other liquid soap.  You can apply chg directly to the skin and wash.  Gently with a scrungie or clean washcloth.  5.  Apply the CHG Soap to your body ONLY FROM THE NECK DOWN.   Do   not use on face/ open                           Wound or open sores. Avoid contact with eyes, ears mouth and   genitals (private parts).                       Wash face,  Genitals (private parts) with your normal soap.             6.  Wash thoroughly, paying special  attention to the area where your    surgery  will be performed.  7.  Thoroughly rinse your body with warm water from the neck down.  8.  DO NOT shower/wash with your normal soap after using and rinsing off the CHG Soap.                9.  Pat yourself dry with a clean towel.            10.  Wear clean pajamas.            11.  Place clean sheets on your bed the night of your first shower and do not  sleep with pets. Day of Surgery : Do not apply any lotions/deodorants the morning of surgery.  Please wear clean clothes to the hospital/surgery center.  FAILURE TO FOLLOW THESE INSTRUCTIONS MAY RESULT IN THE CANCELLATION OF YOUR SURGERY  PATIENT SIGNATURE_________________________________  NURSE SIGNATURE__________________________________  ________________________________________________________________________ WHAT IS A BLOOD TRANSFUSION? Blood Transfusion Information  A transfusion is the replacement of blood or some of its parts. Blood is made up of multiple cells which provide different functions. Red blood cells carry oxygen and are used for blood loss replacement. White blood cells fight against infection. Platelets control bleeding. Plasma helps clot blood. Other blood products are available for specialized needs, such as hemophilia or other clotting disorders. BEFORE THE TRANSFUSION  Who gives blood for transfusions?  Healthy volunteers who are fully evaluated to make sure their blood is safe. This is blood bank blood. Transfusion therapy is the safest it has ever been in the practice of medicine. Before blood is taken from a donor, a complete history is taken to make sure that person has no history of diseases nor engages in risky social behavior (examples are intravenous drug use or sexual activity with multiple partners). The donor's travel history is screened to minimize risk of transmitting infections, such as malaria. The donated blood is tested for signs of infectious diseases,  such as HIV and hepatitis. The blood is then tested to be sure it is compatible with you in order to minimize the chance of a transfusion reaction. If you or a relative donates blood, this is often done in anticipation of surgery and is not appropriate for emergency situations. It takes many days to process the donated blood. RISKS AND COMPLICATIONS Although transfusion therapy is very safe and saves many lives, the main dangers of transfusion include:  Getting an infectious disease. Developing a transfusion reaction. This is an allergic reaction to something in the blood you were given. Every precaution is taken to prevent this. The decision to have  a blood transfusion has been considered carefully by your caregiver before blood is given. Blood is not given unless the benefits outweigh the risks. AFTER THE TRANSFUSION Right after receiving a blood transfusion, you will usually feel much better and more energetic. This is especially true if your red blood cells have gotten low (anemic). The transfusion raises the level of the red blood cells which carry oxygen, and this usually causes an energy increase. The nurse administering the transfusion will monitor you carefully for complications. HOME CARE INSTRUCTIONS  No special instructions are needed after a transfusion. You may find your energy is better. Speak with your caregiver about any limitations on activity for underlying diseases you may have. SEEK MEDICAL CARE IF:  Your condition is not improving after your transfusion. You develop redness or irritation at the intravenous (IV) site. SEEK IMMEDIATE MEDICAL CARE IF:  Any of the following symptoms occur over the next 12 hours: Shaking chills. You have a temperature by mouth above 102 F (38.9 C), not controlled by medicine. Chest, back, or muscle pain. People around you feel you are not acting correctly or are confused. Shortness of breath or difficulty breathing. Dizziness and  fainting. You get a rash or develop hives. You have a decrease in urine output. Your urine turns a dark color or changes to pink, red, or brown. Any of the following symptoms occur over the next 10 days: You have a temperature by mouth above 102 F (38.9 C), not controlled by medicine. Shortness of breath. Weakness after normal activity. The white part of the eye turns yellow (jaundice). You have a decrease in the amount of urine or are urinating less often. Your urine turns a dark color or changes to pink, red, or brown. Document Released: 03/01/2000 Document Revised: 05/27/2011 Document Reviewed: 10/19/2007 [facility] Patient Information 2014 [place], [place].  _______________________________________________________________________

## 2023-02-06 NOTE — Discharge Instructions (Addendum)
[facility] Office Phone Number [phone]   POST OP INSTRUCTIONS  Always review your discharge instruction sheet given to you by the facility where your surgery was performed.  IF YOU HAVE DISABILITY OR FAMILY LEAVE FORMS, YOU MUST BRING THEM TO THE OFFICE FOR PROCESSING.  DO NOT GIVE THEM TO YOUR DOCTOR.  Take your usually prescribed medications unless otherwise directed You should eat very light the first 24 hours after surgery, such as soup, crackers, pudding, etc.  Resume your normal diet the day after surgery. Most patients will experience some swelling and bruising around the surgical site.  Ice packs will help.  Swelling and bruising can take several days to resolve.  You may remove your bandages 48 hours after surgery, and you may shower at that time.  You will have steri-strips (small skin tapes) in place directly over the incision.  These strips should be left on the skin for 7-10 days.   ACTIVITIES:  You may resume regular daily activities (gradually increasing) beginning the next day.   You may have sexual intercourse when it is comfortable. You may drive when you no longer are taking prescription pain medication, you can comfortably wear a seatbelt, and you can safely maneuver your car and apply brakes. RETURN TO WORK:  1 week You should see your doctor in the office for a follow-up appointment as instructed  WHEN TO CALL YOUR DOCTOR: Fever over 101.0 Nausea and/or vomiting. Extreme swelling or bruising. Continued bleeding from incision. Increased pain, redness, or drainage from the incision.  The clinic staff is available to answer your questions during regular business hours.  Please don't hesitate to call and ask to speak to one of the nurses for clinical concerns.  If you have a medical emergency, go to the nearest emergency room or call 911.  A surgeon from [facility] is always on call at the hospital.  For further questions, please visit  [email or web address]    Post Anesthesia Home Care Instructions  Activity: Get plenty of rest for the remainder of the day. A responsible individual must stay with you for 24 hours following the procedure.  For the next 24 hours, DO NOT: -Drive a car -Advertising copywriter -Drink alcoholic beverages -Take any medication unless instructed by your physician -Make any legal decisions or sign important papers.  Meals: Start with liquid foods such as gelatin or soup. Progress to regular foods as tolerated. Avoid greasy, spicy, heavy foods. If nausea and/or vomiting occur, drink only clear liquids until the nausea and/or vomiting subsides. Call your physician if vomiting continues.  Special Instructions/Symptoms: Your throat may feel dry or sore from the anesthesia or the breathing tube placed in your throat during surgery. If this causes discomfort, gargle with warm salt water. The discomfort should disappear within 24 hours.  If you had a scopolamine patch placed behind your ear for the management of post- operative nausea and/or vomiting:  1. The medication in the patch is effective for 72 hours, after which it should be removed.  Wrap patch in a tissue and discard in the trash. Wash hands thoroughly with soap and water. 2. You may remove the patch earlier than 72 hours if you experience unpleasant side effects which may include dry mouth, dizziness or visual disturbances. 3. Avoid touching the patch. Wash your hands with soap and water after contact with the patch.    No Tylenol until after 5pm today if needed

## 2023-02-06 NOTE — Anesthesia Procedure Notes (Signed)
Procedure Name: MAC Date/Time: 02/06/2023 12:02 PM  Performed by: Thornell Mule, CRNAPre-anesthesia Checklist: Patient identified, Emergency Drugs available, Suction available and Patient being monitored Oxygen Delivery Method: Simple face mask

## 2023-02-06 NOTE — Op Note (Signed)
Pre-op diagnosis: Right breast cancer status postmastectomy and chemotherapy Postop diagnosis: Same Procedure performed: Port removal Surgeon:Linkon Siverson K Ajeet Casasola Anesthesia: Local MAC Indications: This is a 43 year old female recently diagnosed with invasive ductal carcinoma of the right breast.  She is status post right mastectomy and left risk-reducing mastectomy with reconstruction.  She also had port placement for chemotherapy.  She has completed chemotherapy and now presents for port removal.  Description of procedure: The patient is brought to the operating room and placed in the supine position on the operating room table.  She was given some intravenous sedation.  Her right chest was prepped with ChloraPrep and draped in sterile fashion.  A timeout was taken to ensure the proper patient and proper procedure.  We infiltrated local anesthetic around her port.  Her incision was opened.  We dissected down to the help of the port.  The 2 stay sutures were removed.  The port was then removed.  Direct pressure was held against the internal jugular vein insertion site for several minutes.  No bleeding was noted.  We excised part of the capsule that had been around the port.  We then closed the wound with a deep layer of 3-0 Vicryl.  4-0 Monocryl was used to close the skin.  Benzoin and Steri-Strips were applied.  The patient was then awakened and brought to the recovery room in stable condition.  All sponge, instrument, and needle counts are correct.  Wilmon Arms. Corliss Skains, MD, [facility] Surgery  General Surgery   02/06/2023 12:35 PM

## 2023-02-06 NOTE — H&P (Signed)
Rachel Singleton is an 42 y.o. female.   Chief Complaint: breast cancer HPI: 42 year old female s/p recent diagnosis of right invasive ductal carcinoma s/p right mastectomy/ left risk-reducing mastectomy with reconstruction 09/17/22.  Oncotype score 25.  The patient has completed chemotherapy and presents now for port removal.  Past Medical History:  Diagnosis Date   Asthma    pregnancy only   Breast cancer (HCC) 08/21/2022   Former smoker 03/18/2006   Quit 2008   Hx of varicella     Past Surgical History:  Procedure Laterality Date   BREAST BIOPSY Right 08/21/2022   MM RT BREAST BX W LOC DEV 1ST LESION IMAGE BX SPEC STEREO GUIDE 08/21/2022 [facility]   BREAST BIOPSY Right 08/21/2022   MM RT BREAST BX W LOC DEV EA AD LESION IMG BX SPEC STEREO GUIDE 08/21/2022 [facility]   BREAST BIOPSY Right 08/21/2022   [place] RT BREAST BX W LOC DEV 1ST LESION IMG BX SPEC US GUIDE 08/21/2022 [facility]   BREAST RECONSTRUCTION WITH PLACEMENT OF TISSUE EXPANDER AND ALLODERM Bilateral 09/17/2022   Procedure: BREAST RECONSTRUCTION WITH PLACEMENT OF TISSUE EXPANDER AND ALLODERM;  Surgeon: Glenna Fellows, MD;  Location: [facility];  Service: Plastics;  Laterality: Bilateral;   MASTECTOMY W/ SENTINEL NODE BIOPSY Right 09/17/2022   Procedure: RIGHT MASTECTOMY WITH SENTINEL LYMPH NODE BIOPSY;  Surgeon: Manus Rudd, MD;  Location: [facility];  Service: General;  Laterality: Right;   PORTACATH PLACEMENT N/A 11/07/2022   Procedure: ULTRASOUND-GUIDED PORT PLACEMENT;  Surgeon: Manus Rudd, MD;  Location: [facility];  Service: General;  Laterality: N/A;  LMA   SIMPLE MASTECTOMY WITH AXILLARY SENTINEL NODE BIOPSY Left 09/17/2022   Procedure: LEFT SIMPLE MASTECTOMY;  Surgeon: Manus Rudd, MD;  Location: [facility];  Service: General;  Laterality: Left;   WISDOM TOOTH EXTRACTION      Family History  Problem Relation Age of Onset   Breast cancer Mother 68        DCIS (-/-)   Heart disease Father    Heart attack Father    Cystic fibrosis Brother    Colon cancer Maternal Grandfather 74 - 75   Skin cancer Maternal Aunt 60 - 69   Breast cancer Paternal Aunt 49 - 69   Breast cancer Other 50 - 59   Ovarian cancer Neg Hx    Endometrial cancer Neg Hx    Social History:  reports that she has quit smoking. Her smoking use included cigarettes. She has been exposed to tobacco smoke. She has never used smokeless tobacco. She reports that she does not drink alcohol and does not use drugs.  Allergies:  Allergies  Allergen Reactions   Aluminum-Containing Compounds Hives and Rash    Medications Prior to Admission  Medication Sig Dispense Refill   ondansetron (ZOFRAN) 8 MG tablet Take 1 tablet (8 mg total) by mouth every 8 (eight) hours as needed for nausea or vomiting. Start on the third day after chemotherapy. 30 tablet 1   anastrozole (ARIMIDEX) 1 MG tablet Take 1 tablet (1 mg total) by mouth daily. 90 tablet 3   Ascorbic Acid (VITAMIN C) 1000 MG tablet Take 1,000 mg by mouth daily. (Patient not taking: Reported on 01/08/2023)     b complex vitamins capsule Take 1 capsule by mouth daily. (Patient not taking: Reported on 01/08/2023)     Calcium Citrate-Vitamin D (CALCIUM + D PO) Take 1 tablet by mouth daily. (Patient not taking: Reported on  01/08/2023)     dexamethasone (DECADRON) 4 MG tablet Take 1 tablet day before chemo and 1 tablet day after chemo with food (Patient not taking: Reported on 02/03/2023) 8 tablet 0   lidocaine-prilocaine (EMLA) cream Apply to affected area once 30 g 3   magnesium 30 MG tablet Take 30 mg by mouth 2 (two) times daily. (Patient not taking: Reported on 01/08/2023)     OVER THE COUNTER MEDICATION Take 1 capsule by mouth daily. EstroCleanse (Patient not taking: Reported on 01/08/2023)     OVER THE COUNTER MEDICATION Take 1 capsule by mouth daily. Sulforaphane (Patient not taking: Reported on 01/08/2023)     prochlorperazine  (COMPAZINE) 10 MG tablet Take 1 tablet (10 mg total) by mouth every 6 (six) hours as needed for nausea or vomiting. 30 tablet 1   QUERCETIN PO Take 1 tablet by mouth daily. (Patient not taking: Reported on 01/08/2023)     Turmeric 400 MG CAPS Take 400 mg by mouth daily. (Patient not taking: Reported on 01/08/2023)     Zinc 30 MG CAPS Take 30 mg by mouth daily. (Patient not taking: Reported on 01/08/2023)      Results for orders placed or performed during the hospital encounter of 02/06/23 (from the past 48 hour(s))  Pregnancy, urine POC     Status: None   Collection Time: 02/06/23 10:48 AM  Result Value Ref Range   Preg Test, Ur NEGATIVE NEGATIVE    Comment:        THE SENSITIVITY OF THIS METHODOLOGY IS >24 mIU/mL    Height 5\' 7"  (1.702 m), weight 59 kg. Physical Exam  Chest: scars maturing bilateral chest expanded Rippling noted upper poles  Port right chest  Assessment/Plan Right breast cancer s/p chemotherapy.  Port removal.  The surgical procedure has been discussed with the patient.  Potential risks, benefits, alternative treatments, and expected outcomes have been explained.  All of the patient's questions at this time have been answered.  The likelihood of reaching the patient's treatment goal is good.  The patient understand the proposed surgical procedure and wishes to proceed.   [name], MD 02/06/2023, 10:53 AM

## 2023-02-06 NOTE — Anesthesia Preprocedure Evaluation (Addendum)
Anesthesia Evaluation  Patient identified by MRN, date of birth, ID band Patient awake    Reviewed: Allergy & Precautions, NPO status , Patient's Chart, lab work & pertinent test results  Airway Mallampati: I  TM Distance: >3 FB Neck ROM: Full    Dental no notable dental hx. (+) Teeth Intact, Dental Advisory Given   Pulmonary asthma , former smoker   Pulmonary exam normal breath sounds clear to auscultation       Cardiovascular negative cardio ROS Normal cardiovascular exam Rhythm:Regular Rate:Normal     Neuro/Psych negative neurological ROS  negative psych ROS   GI/Hepatic negative GI ROS, Neg liver ROS,,,  Endo/Other  negative endocrine ROS    Renal/GU negative Renal ROS  negative genitourinary   Musculoskeletal negative musculoskeletal ROS (+)    Abdominal   Peds  Hematology negative hematology ROS (+)   Anesthesia Other Findings Right breast CA  Reproductive/Obstetrics                             Anesthesia Physical Anesthesia Plan  ASA: 2  Anesthesia Plan: MAC   Post-op Pain Management: Tylenol PO (pre-op)*   Induction: Intravenous  PONV Risk Score and Plan: 2 and Propofol infusion, Treatment may vary due to age or medical condition, Midazolam, Ondansetron and Dexamethasone  Airway Management Planned: Natural Airway  Additional Equipment:   Intra-op Plan:   Post-operative Plan:   Informed Consent: I have reviewed the patients History and Physical, chart, labs and discussed the procedure including the risks, benefits and alternatives for the proposed anesthesia with the patient or authorized representative who has indicated his/her understanding and acceptance.     Dental advisory given  Plan Discussed with: CRNA  Anesthesia Plan Comments:        Anesthesia Quick Evaluation

## 2023-02-06 NOTE — Anesthesia Postprocedure Evaluation (Signed)
Anesthesia Post Note  Patient: Rachel Singleton  Procedure(s) Performed: REMOVAL PORT-A-CATH     Patient location during evaluation: PACU Anesthesia Type: MAC Level of consciousness: awake Pain management: pain level controlled Vital Signs Assessment: post-procedure vital signs reviewed and stable Respiratory status: spontaneous breathing, nonlabored ventilation and respiratory function stable Cardiovascular status: stable and blood pressure returned to baseline Postop Assessment: no apparent nausea or vomiting Anesthetic complications: no   No notable events documented.  Last Vitals:  Vitals:   02/06/23 1245 02/06/23 1300  BP: (!) 93/46 (!) 96/59  Pulse: 93 87  Resp: 15 18  Temp:    SpO2: 100% 100%    Last Pain:  Vitals:   02/06/23 1300  TempSrc:   PainSc: 0-No pain                 Linton Rump

## 2023-02-06 NOTE — Progress Notes (Signed)
COVID Vaccine Completed:  Date of COVID positive in last 90 days:  PCP - Deatra James, MD Cardiologist -  Oncologist- Serena Croissant, MD  Chest x-ray - 11/07/22 Epic EKG -  Stress Test -  ECHO -  Cardiac Cath -  Pacemaker/ICD device last checked: Spinal Cord Stimulator:  Bowel Prep -   Sleep Study -  CPAP -   Fasting Blood Sugar -  Checks Blood Sugar _____ times a day  Last dose of GLP1 agonist-  N/A GLP1 instructions:  Hold 7 days before surgery    Last dose of SGLT-2 inhibitors-  N/A SGLT-2 instructions:  Hold 3 days before surgery    Blood Thinner Instructions:  Time Aspirin Instructions: Last Dose:  Activity level:  Can go up a flight of stairs and perform activities of daily living without stopping and without symptoms of chest pain or shortness of breath.  Able to exercise without symptoms  Unable to go up a flight of stairs without symptoms of     Anesthesia review: chemotherapy  Patient denies shortness of breath, fever, cough and chest pain at PAT appointment  Patient verbalized understanding of instructions that were given to them at the PAT appointment. Patient was also instructed that they will need to review over the PAT instructions again at home before surgery.

## 2023-02-06 NOTE — Transfer of Care (Signed)
Immediate Anesthesia Transfer of Care Note  Patient: Rachel Singleton  Procedure(s) Performed: REMOVAL PORT-A-CATH  Patient Location: PACU  Anesthesia Type:MAC  Level of Consciousness: drowsy and patient cooperative  Airway & Oxygen Therapy: Patient Spontanous Breathing and Patient connected to face mask oxygen  Post-op Assessment: Report given to RN and Post -op Vital signs reviewed and stable  Post vital signs: Reviewed and stable  Last Vitals:  Vitals Value Taken Time  BP    Temp    Pulse 96 02/06/23 1237  Resp 15 02/06/23 1237  SpO2 100 % 02/06/23 1237  Vitals shown include unfiled device data.  Last Pain:  Vitals:   02/06/23 1054  TempSrc: Temporal  PainSc: 0-No pain         Complications: No notable events documented.

## 2023-02-07 ENCOUNTER — Encounter (HOSPITAL_COMMUNITY): Payer: Self-pay

## 2023-02-07 ENCOUNTER — Encounter (HOSPITAL_COMMUNITY)
Admission: RE | Admit: 2023-02-07 | Discharge: 2023-02-07 | Disposition: A | Discharge status: Home / Self Care | Payer: Self-pay | Source: Ambulatory Visit | Attending: Psychiatry | Admitting: Psychiatry

## 2023-02-07 ENCOUNTER — Other Ambulatory Visit: Payer: Self-pay

## 2023-02-07 DIAGNOSIS — Z01812 Encounter for preprocedural laboratory examination: Secondary | ICD-10-CM | POA: Insufficient documentation

## 2023-02-07 DIAGNOSIS — C50911 Malignant neoplasm of unspecified site of right female breast: Secondary | ICD-10-CM | POA: Insufficient documentation

## 2023-02-07 HISTORY — DX: Pneumonia, unspecified organism: J18.9

## 2023-02-07 LAB — COMPREHENSIVE METABOLIC PANEL
ALT: 19 U/L (ref 0–44)
AST: 18 U/L (ref 15–41)
Albumin: 4 g/dL (ref 3.5–5.0)
Alkaline Phosphatase: 42 U/L (ref 38–126)
Anion gap: 7 (ref 5–15)
BUN: 17 mg/dL (ref 6–20)
CO2: 26 mmol/L (ref 22–32)
Calcium: 8.7 mg/dL — ABNORMAL LOW (ref 8.9–10.3)
Chloride: 102 mmol/L (ref 98–111)
Creatinine, Ser: 0.57 mg/dL (ref 0.44–1.00)
GFR, Estimated: >60 mL/min (ref >60)
Glucose, Bld: 86 mg/dL (ref 70–99)
Potassium: 3.8 mmol/L (ref 3.5–5.1)
Sodium: 135 mmol/L (ref 135–145)
Total Bilirubin: 0.8 mg/dL (ref <1.2)
Total Protein: 6.7 g/dL (ref 6.5–8.1)

## 2023-02-07 LAB — CBC
HCT: 34.4 % — ABNORMAL LOW (ref 36.0–46.0)
Hemoglobin: 11 g/dL — ABNORMAL LOW (ref 12.0–15.0)
MCH: 29.7 pg (ref 26.0–34.0)
MCHC: 32 g/dL (ref 30.0–36.0)
MCV: 93 fL (ref 80.0–100.0)
Platelets: 266 10*3/uL (ref 150–400)
RBC: 3.7 MIL/uL — ABNORMAL LOW (ref 3.87–5.11)
RDW: 15.9 % — ABNORMAL HIGH (ref 11.5–15.5)
WBC: 5.3 10*3/uL (ref 4.0–10.5)
nRBC: 0 % (ref 0.0–0.2)

## 2023-02-07 NOTE — Patient Instructions (Signed)
SURGICAL WAITING ROOM VISITATION  Patients having surgery or a procedure may have no more than 2 support people in the waiting area - these visitors may rotate.    Children under the age of 36 must have an adult with them who is not the patient.  Due to an increase in RSV and influenza rates and associated hospitalizations, children ages 65 and under may not visit patients in [facility] hospitals.  If the patient needs to stay at the hospital during part of their recovery, the visitor guidelines for inpatient rooms apply. Pre-op nurse will coordinate an appropriate time for 1 support person to accompany patient in pre-op.  This support person may not rotate.    Please refer to the [facility] website for the visitor guidelines for Inpatients (after your surgery is over and you are in a regular room).    Your procedure is scheduled on: 02/11/23   Report to [facility] Main Entrance    Report to admitting at 11:45 AM   Call this number if you have problems the morning of surgery [phone]   Do not eat food :After Midnight.   After Midnight you may have the following liquids until 11:00 AM DAY OF SURGERY  Water Non-Citrus Juices (without pulp, NO RED-Apple, White grape, White cranberry) Black Coffee (NO MILK/CREAM OR CREAMERS, sugar ok)  Clear Tea (NO MILK/CREAM OR CREAMERS, sugar ok) regular and decaf                             Plain Jell-O (NO RED)                                           Fruit ices (not with fruit pulp, NO RED)                                     Popsicles (NO RED)                                                               Sports drinks like Gatorade (NO RED)                      If you have questions, please contact your surgeon's office.   FOLLOW BOWEL PREP AND ANY ADDITIONAL PRE OP INSTRUCTIONS YOU RECEIVED FROM YOUR SURGEON'S OFFICE!!!     Oral Hygiene is also important to reduce your risk of infection.                                     Remember - BRUSH YOUR TEETH THE MORNING OF SURGERY WITH YOUR REGULAR TOOTHPASTE  DENTURES WILL BE REMOVED PRIOR TO SURGERY PLEASE DO NOT APPLY "Poly grip" OR ADHESIVES!!!   Do NOT smoke after Midnight   Stop all vitamins and herbal supplements 7 days before surgery.   Take these medicines the morning of surgery with A SIP OF WATER: Zofran and Compazine  You may not have any metal on your body including hair pins, jewelry, and body piercing             Do not wear make-up, lotions, powders, perfumes, or deodorant  Do not wear nail polish including gel and S&S, artificial/acrylic nails, or any other type of covering on natural nails including finger and toenails. If you have artificial nails, gel coating, etc. that needs to be removed by a nail salon please have this removed prior to surgery or surgery may need to be canceled/ delayed if the surgeon/ anesthesia feels like they are unable to be safely monitored.   Do not shave  48 hours prior to surgery.    Do not bring valuables to the hospital. [facility] IS NOT             RESPONSIBLE   FOR VALUABLES.   Contacts, glasses, dentures or bridgework may not be worn into surgery.  DO NOT BRING YOUR HOME MEDICATIONS TO THE HOSPITAL. PHARMACY WILL DISPENSE MEDICATIONS LISTED ON YOUR MEDICATION LIST TO YOU DURING YOUR ADMISSION IN THE HOSPITAL!    Patients discharged on the day of surgery will not be allowed to drive home.  Someone NEEDS to stay with you for the first 24 hours after anesthesia.              Please read over the following fact sheets you were given: IF YOU HAVE QUESTIONS ABOUT YOUR PRE-OP INSTRUCTIONS PLEASE CALL [phone] Contras    If you received a COVID test during your pre-op visit  it is requested that you wear a mask when out in public, stay away from anyone that may not be feeling well and notify your surgeon if you develop symptoms. If you test positive for Covid or have been in  contact with anyone that has tested positive in the last 10 days please notify you surgeon.    [place] - Preparing for Surgery Before surgery, you can play an important role.  Because skin is not sterile, your skin needs to be as free of germs as possible.  You can reduce the number of germs on your skin by washing with CHG (chlorahexidine gluconate) soap before surgery.  CHG is an antiseptic cleaner which kills germs and bonds with the skin to continue killing germs even after washing. Please DO NOT use if you have an allergy to CHG or antibacterial soaps.  If your skin becomes reddened/irritated stop using the CHG and inform your nurse when you arrive at Short Stay. Do not shave (including legs and underarms) for at least 48 hours prior to the first CHG shower.  You may shave your face/neck.  Please follow these instructions carefully:  1.  Shower with CHG Soap the night before surgery and the  morning of surgery.  2.  If you choose to wash your hair, wash your hair first as usual with your normal  shampoo.  3.  After you shampoo, rinse your hair and body thoroughly to remove the shampoo.                             4.  Use CHG as you would any other liquid soap.  You can apply chg directly to the skin and wash.  Gently with a scrungie or clean washcloth.  5.  Apply the CHG Soap to your body ONLY FROM THE NECK DOWN.   Do   not use on  face/ open                           Wound or open sores. Avoid contact with eyes, ears mouth and   genitals (private parts).                       Wash face,  Genitals (private parts) with your normal soap.             6.  Wash thoroughly, paying special attention to the area where your    surgery  will be performed.  7.  Thoroughly rinse your body with warm water from the neck down.  8.  DO NOT shower/wash with your normal soap after using and rinsing off the CHG Soap.                9.  Pat yourself dry with a clean towel.            10.  Wear clean  pajamas.            11.  Place clean sheets on your bed the night of your first shower and do not  sleep with pets. Day of Surgery : Do not apply any lotions/deodorants the morning of surgery.  Please wear clean clothes to the hospital/surgery center.  FAILURE TO FOLLOW THESE INSTRUCTIONS MAY RESULT IN THE CANCELLATION OF YOUR SURGERY  PATIENT SIGNATURE_________________________________  NURSE SIGNATURE__________________________________  ________________________________________________________________________ WHAT IS A BLOOD TRANSFUSION? Blood Transfusion Information  A transfusion is the replacement of blood or some of its parts. Blood is made up of multiple cells which provide different functions. Red blood cells carry oxygen and are used for blood loss replacement. White blood cells fight against infection. Platelets control bleeding. Plasma helps clot blood. Other blood products are available for specialized needs, such as hemophilia or other clotting disorders. BEFORE THE TRANSFUSION  Who gives blood for transfusions?  Healthy volunteers who are fully evaluated to make sure their blood is safe. This is blood bank blood. Transfusion therapy is the safest it has ever been in the practice of medicine. Before blood is taken from a donor, a complete history is taken to make sure that person has no history of diseases nor engages in risky social behavior (examples are intravenous drug use or sexual activity with multiple partners). The donor's travel history is screened to minimize risk of transmitting infections, such as malaria. The donated blood is tested for signs of infectious diseases, such as HIV and hepatitis. The blood is then tested to be sure it is compatible with you in order to minimize the chance of a transfusion reaction. If you or a relative donates blood, this is often done in anticipation of surgery and is not appropriate for emergency situations. It takes many days to process  the donated blood. RISKS AND COMPLICATIONS Although transfusion therapy is very safe and saves many lives, the main dangers of transfusion include:  Getting an infectious disease. Developing a transfusion reaction. This is an allergic reaction to something in the blood you were given. Every precaution is taken to prevent this. The decision to have a blood transfusion has been considered carefully by your caregiver before blood is given. Blood is not given unless the benefits outweigh the risks. AFTER THE TRANSFUSION Right after receiving a blood transfusion, you will usually feel much better and more energetic. This is especially true if your red blood cells have gotten  low (anemic). The transfusion raises the level of the red blood cells which carry oxygen, and this usually causes an energy increase. The nurse administering the transfusion will monitor you carefully for complications. HOME CARE INSTRUCTIONS  No special instructions are needed after a transfusion. You may find your energy is better. Speak with your caregiver about any limitations on activity for underlying diseases you may have. SEEK MEDICAL CARE IF:  Your condition is not improving after your transfusion. You develop redness or irritation at the intravenous (IV) site. SEEK IMMEDIATE MEDICAL CARE IF:  Any of the following symptoms occur over the next 12 hours: Shaking chills. You have a temperature by mouth above 102 F (38.9 C), not controlled by medicine. Chest, back, or muscle pain. People around you feel you are not acting correctly or are confused. Shortness of breath or difficulty breathing. Dizziness and fainting. You get a rash or develop hives. You have a decrease in urine output. Your urine turns a dark color or changes to pink, red, or brown. Any of the following symptoms occur over the next 10 days: You have a temperature by mouth above 102 F (38.9 C), not controlled by medicine. Shortness of  breath. Weakness after normal activity. The white part of the eye turns yellow (jaundice). You have a decrease in the amount of urine or are urinating less often. Your urine turns a dark color or changes to pink, red, or brown. Document Released: 03/01/2000 Document Revised: 05/27/2011 Document Reviewed: 10/19/2007 [facility] Patient Information 2014 [place], [place].  _______________________________________________________________________

## 2023-02-07 NOTE — Progress Notes (Signed)
COVID Vaccine Completed: no   Date of COVID positive in last 90 days: no   PCP - Deatra James, MD Cardiologist - n/a Oncologist- Serena Croissant, MD   Chest x-ray - 11/07/22 Epic EKG - n/a Stress Test - n/a ECHO - n/a Cardiac Cath - n/a Pacemaker/ICD device last checked: n/a Spinal Cord Stimulator: n/a   Bowel Prep - no   Sleep Study - n/a CPAP -    Fasting Blood Sugar - n/a Checks Blood Sugar _____ times a day   Last dose of GLP1 agonist-  N/A GLP1 instructions:  Hold 7 days before surgery    Last dose of SGLT-2 inhibitors-  N/A SGLT-2 instructions:  Hold 3 days before surgery      Blood Thinner Instructions:  n/a Aspirin Instructions: Last Dose:   Activity level:  Can go up a flight of stairs and perform activities of daily living without stopping and without symptoms of chest pain or shortness of breath.                       Anesthesia review: chemotherapy   Patient denies shortness of breath, fever, cough and chest pain at PAT appointment   Patient verbalized understanding of instructions that were given to them at the PAT appointment. Patient was also instructed that they will need to review over the PAT instructions again at home before surgery.

## 2023-02-10 ENCOUNTER — Other Ambulatory Visit: Payer: Self-pay | Admitting: Gynecologic Oncology

## 2023-02-10 ENCOUNTER — Encounter: Payer: Self-pay | Admitting: Gynecologic Oncology

## 2023-02-10 ENCOUNTER — Telehealth: Payer: Self-pay

## 2023-02-10 ENCOUNTER — Encounter (HOSPITAL_BASED_OUTPATIENT_CLINIC_OR_DEPARTMENT_OTHER): Payer: Self-pay | Admitting: Surgery

## 2023-02-10 DIAGNOSIS — C50911 Malignant neoplasm of unspecified site of right female breast: Secondary | ICD-10-CM

## 2023-02-10 MED ORDER — OXYCODONE HCL 5 MG PO TABS: 5.0000 mg | ORAL_TABLET | ORAL | 0 refills | Status: DC | PRN

## 2023-02-10 MED ORDER — SENNOSIDES-DOCUSATE SODIUM 8.6-50 MG PO TABS: 2.0000 | ORAL_TABLET | Freq: Every day | ORAL | 0 refills | Status: DC

## 2023-02-10 NOTE — H&P (Signed)
  Subjective Patient ID: Rachel Singleton is a 42 y.o. female.     HPI   Nearly 5 months post op bilateral mastectomies with immediate expander based reconstruction. Scheduled for implant exchange next month. Underwent port removal last week and scheduled for BSO this week.    Presented with palpable mass right breast. Diagnostic MMG/US showed 3 cm mass right breast 2 o'clock on top of the pectoralis muscle. Diffuse calcifications throughout the right breast are asymmetrically increased compared to the left side noted. [place] axilla right normal. Biopsies labeled right breast upper central and right breast lower central both benign. Biopsy labeled right breast 2 o clock IDC ER/PR+, Her2-.    MRI demonstrated biopsy-proven malignancy 1.9 cm right UIQ abuts the anterior margin of the pectoralis muscle with no visible intervening fat plane.   Patient elected for bilateral mastectomies. 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    Oncotype 25. Completed adjuvant chemotherapy.   Staging scans 11/2022 negative for distant disease.   Genetics VUS POLE. Mother with breast ca. She underwent lumpectomy and eventual saline implant to that side.   Prior 34 B. Right mastectomy 221 g Left mastectomy 268 g   Lives with spouse and 5 children ages 11-14. 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    Review of Systems       Objective Physical Exam  Cardiovascular: Normal rate, regular rhythm and normal heart sounds.    Pulmonary/Chest Effort normal and breath sounds normal.      Chest: scars maturing bilateral chest expanded Rippling noted upper poles   Abd: minimal soft tissue donor site       Assessment/Plan Malignant neoplasm of upper-inner quadrant of right breast in female, estrogen receptor positive (CMS/HCC) S/p bilateral NSM right SLN bilateral prepectoral TE/ADM (Alloderm) reconstruction Adjuvant  chemotherapy     Plan removal bilateral expanders and placement implants.   Reviewed saline vs silicone, shaped vs round, textured vs smooth. Recommend HCG or capacity filled silicone implants as they may offer reduced risk visible rippling. Reviewed MRI or [place] surveillance for rupture with silicone implants. Reviewed risks BIA ALCL with textured devices, new reports SCC implant associated with breast implants. Reviewed shared risks rupture contracture need for additional surgery. Counseled implants are not permanent devices. Reviewed size in part guided by width chest, cannot assure her cup size. Patient has elected for saline, plan smooth round. Completed Natrelle Saline physician patient checklist.   Reviewed fat grafting purpose. Reviewed variable take fat, may need to repeat, fat necrosis that presents as masses. She has minimal donor site for this. No plans for this.   Additional risks including but not limited to bleeding hematoma seroma infection wound healing problems asymmetry unacceptable cosmetic result damage to adjacent structures need for additional procedures blood clots in legs or lungs reviewed.    Rx for Bactrim given. Patient will let me know if she needs pain medication or muscle relaxant.   Natrelle 133S FV -11T 300 ml tissue expanders placed bilateral initial fill volume RIGHT 250 ml saline LEFT 235 ml saline.     Glenna Fellows, MD [facility] Plastic [facility] Reconstructive Surgery  Office/ physician access line after hours [phone]

## 2023-02-10 NOTE — Telephone Encounter (Signed)
Telephone call to check on pre-operative status.  Patient compliant with pre-operative instructions.  Reinforced nothing to eat after midnight. Clear liquids until 11:00. Patient to arrive at 11:45.  No questions or concerns voiced.  Instructed to call for any needs.

## 2023-02-11 ENCOUNTER — Ambulatory Visit (HOSPITAL_BASED_OUTPATIENT_CLINIC_OR_DEPARTMENT_OTHER): Payer: Self-pay | Admitting: Registered Nurse

## 2023-02-11 ENCOUNTER — Encounter (HOSPITAL_COMMUNITY)
Admission: RE | Disposition: A | Discharge status: Home / Self Care | Payer: Self-pay | Source: Ambulatory Visit | Attending: Psychiatry

## 2023-02-11 ENCOUNTER — Other Ambulatory Visit: Payer: Self-pay

## 2023-02-11 ENCOUNTER — Encounter (HOSPITAL_COMMUNITY): Payer: Self-pay | Admitting: Psychiatry

## 2023-02-11 ENCOUNTER — Ambulatory Visit (HOSPITAL_COMMUNITY)
Admission: RE | Admit: 2023-02-11 | Discharge: 2023-02-11 | Disposition: A | Discharge status: Home / Self Care | Payer: Self-pay | Source: Ambulatory Visit | Attending: Psychiatry | Admitting: Psychiatry

## 2023-02-11 ENCOUNTER — Ambulatory Visit (HOSPITAL_COMMUNITY): Payer: Self-pay | Admitting: Registered Nurse

## 2023-02-11 DIAGNOSIS — Z87891 Personal history of nicotine dependence: Secondary | ICD-10-CM | POA: Insufficient documentation

## 2023-02-11 DIAGNOSIS — Z4002 Encounter for prophylactic removal of ovary: Secondary | ICD-10-CM

## 2023-02-11 DIAGNOSIS — C50911 Malignant neoplasm of unspecified site of right female breast: Secondary | ICD-10-CM

## 2023-02-11 DIAGNOSIS — C50919 Malignant neoplasm of unspecified site of unspecified female breast: Secondary | ICD-10-CM

## 2023-02-11 DIAGNOSIS — Z9013 Acquired absence of bilateral breasts and nipples: Secondary | ICD-10-CM | POA: Insufficient documentation

## 2023-02-11 DIAGNOSIS — C50211 Malignant neoplasm of upper-inner quadrant of right female breast: Secondary | ICD-10-CM | POA: Insufficient documentation

## 2023-02-11 DIAGNOSIS — Z170 Estrogen receptor positive status [ER+]: Secondary | ICD-10-CM | POA: Insufficient documentation

## 2023-02-11 DIAGNOSIS — Z1721 Progesterone receptor positive status: Secondary | ICD-10-CM | POA: Insufficient documentation

## 2023-02-11 HISTORY — PX: ROBOTIC ASSISTED BILATERAL SALPINGO OOPHERECTOMY: SHX6078

## 2023-02-11 LAB — TYPE AND SCREEN
ABO/RH(D): A POS
Antibody Screen: NEGATIVE

## 2023-02-11 LAB — ABO/RH: ABO/RH(D): A POS

## 2023-02-11 LAB — POCT PREGNANCY, URINE: Preg Test, Ur: NEGATIVE

## 2023-02-11 SURGERY — SALPINGO-OOPHORECTOMY, BILATERAL, ROBOT-ASSISTED
Anesthesia: General | Laterality: Bilateral

## 2023-02-11 MED ORDER — FENTANYL CITRATE (PF) 250 MCG/5ML IJ SOLN
INTRAMUSCULAR | Status: AC
  Filled 2023-02-11: qty 5

## 2023-02-11 MED ORDER — PROPOFOL 10 MG/ML IV BOLUS
INTRAVENOUS | Status: DC | PRN
  Administered 2023-02-11: 120 mg via INTRAVENOUS

## 2023-02-11 MED ORDER — DEXAMETHASONE SODIUM PHOSPHATE 4 MG/ML IJ SOLN
4.0000 mg | INTRAMUSCULAR | Status: DC
Start: 2023-02-12 — End: 2023-02-11

## 2023-02-11 MED ORDER — DEXAMETHASONE SODIUM PHOSPHATE 4 MG/ML IJ SOLN
4.0000 mg | INTRAMUSCULAR | Status: DC
Start: 2023-02-11 — End: 2023-02-11

## 2023-02-11 MED ORDER — SUGAMMADEX SODIUM 200 MG/2ML IV SOLN
INTRAVENOUS | Status: DC | PRN
  Administered 2023-02-11: 300 mg via INTRAVENOUS

## 2023-02-11 MED ORDER — DEXAMETHASONE SODIUM PHOSPHATE 10 MG/ML IJ SOLN
INTRAMUSCULAR | Status: DC | PRN
  Administered 2023-02-11: 5 mg via INTRAVENOUS

## 2023-02-11 MED ORDER — ONDANSETRON HCL 4 MG/2ML IJ SOLN
INTRAMUSCULAR | Status: AC
  Filled 2023-02-11: qty 2

## 2023-02-11 MED ORDER — ORAL CARE MOUTH RINSE: 15.0000 mL | Freq: Once | OROMUCOSAL | Status: AC

## 2023-02-11 MED ORDER — ROCURONIUM BROMIDE 10 MG/ML (PF) SYRINGE
PREFILLED_SYRINGE | INTRAVENOUS | Status: DC | PRN
  Administered 2023-02-11: 60 mg via INTRAVENOUS

## 2023-02-11 MED ORDER — OXYCODONE HCL 5 MG PO TABS
5.0000 mg | ORAL_TABLET | Freq: Once | ORAL | Status: AC | PRN
  Administered 2023-02-11: 5 mg via ORAL

## 2023-02-11 MED ORDER — BUPIVACAINE HCL 0.25 % IJ SOLN
INTRAMUSCULAR | Status: AC
  Filled 2023-02-11: qty 1

## 2023-02-11 MED ORDER — OXYCODONE HCL 5 MG PO TABS
ORAL_TABLET | ORAL | Status: AC
  Filled 2023-02-11: qty 1

## 2023-02-11 MED ORDER — MIDAZOLAM HCL 2 MG/2ML IJ SOLN
INTRAMUSCULAR | Status: AC
Start: 2023-02-11 — End: ?
  Filled 2023-02-11: qty 2

## 2023-02-11 MED ORDER — ACETAMINOPHEN 500 MG PO TABS
1000.0000 mg | ORAL_TABLET | ORAL | Status: AC
  Administered 2023-02-11: 1000 mg via ORAL
  Filled 2023-02-11: qty 2

## 2023-02-11 MED ORDER — GABAPENTIN 300 MG PO CAPS
300.0000 mg | ORAL_CAPSULE | ORAL | Status: AC
Start: 2023-02-11 — End: 2023-02-11
  Administered 2023-02-11: 300 mg via ORAL
  Filled 2023-02-11: qty 1

## 2023-02-11 MED ORDER — HYDROMORPHONE HCL 1 MG/ML IJ SOLN
INTRAMUSCULAR | Status: AC
  Administered 2023-02-11: 0.5 mg via INTRAVENOUS
  Filled 2023-02-11: qty 1

## 2023-02-11 MED ORDER — ONDANSETRON HCL 4 MG/2ML IJ SOLN
4.0000 mg | Freq: Once | INTRAMUSCULAR | Status: AC | PRN
  Administered 2023-02-11: 4 mg via INTRAVENOUS

## 2023-02-11 MED ORDER — ONDANSETRON HCL 4 MG/2ML IJ SOLN
INTRAMUSCULAR | Status: DC | PRN
  Administered 2023-02-11: 4 mg via INTRAVENOUS

## 2023-02-11 MED ORDER — SCOPOLAMINE 1 MG/3DAYS TD PT72
1.0000 | MEDICATED_PATCH | TRANSDERMAL | Status: DC
  Administered 2023-02-11: 1.5 mg via TRANSDERMAL
  Filled 2023-02-11: qty 1

## 2023-02-11 MED ORDER — PHENYLEPHRINE 80 MCG/ML (10ML) SYRINGE FOR IV PUSH (FOR BLOOD PRESSURE SUPPORT)
PREFILLED_SYRINGE | INTRAVENOUS | Status: DC | PRN
  Administered 2023-02-11: 160 ug via INTRAVENOUS
  Administered 2023-02-11: 80 ug via INTRAVENOUS
  Administered 2023-02-11: 160 ug via INTRAVENOUS

## 2023-02-11 MED ORDER — LACTATED RINGERS IV SOLN: INTRAVENOUS | Status: DC

## 2023-02-11 MED ORDER — MIDAZOLAM HCL 5 MG/5ML IJ SOLN
INTRAMUSCULAR | Status: DC | PRN
  Administered 2023-02-11: 2 mg via INTRAVENOUS

## 2023-02-11 MED ORDER — HEPARIN SODIUM (PORCINE) 5000 UNIT/ML IJ SOLN
5000.0000 [IU] | INTRAMUSCULAR | Status: AC
  Administered 2023-02-11: 5000 [IU] via SUBCUTANEOUS
  Filled 2023-02-11: qty 1

## 2023-02-11 MED ORDER — BUPIVACAINE HCL 0.25 % IJ SOLN
INTRAMUSCULAR | Status: DC | PRN
  Administered 2023-02-11: 21 mL

## 2023-02-11 MED ORDER — PROPOFOL 10 MG/ML IV BOLUS
INTRAVENOUS | Status: AC
  Filled 2023-02-11: qty 20

## 2023-02-11 MED ORDER — OXYCODONE HCL 5 MG/5ML PO SOLN: 5.0000 mg | Freq: Once | ORAL | Status: AC | PRN

## 2023-02-11 MED ORDER — LIDOCAINE HCL (CARDIAC) PF 100 MG/5ML IV SOSY
PREFILLED_SYRINGE | INTRAVENOUS | Status: DC | PRN
  Administered 2023-02-11: 60 mg via INTRAVENOUS

## 2023-02-11 MED ORDER — DEXMEDETOMIDINE HCL IN NACL 80 MCG/20ML IV SOLN
INTRAVENOUS | Status: DC | PRN
  Administered 2023-02-11 (×2): 4 ug via INTRAVENOUS

## 2023-02-11 MED ORDER — STERILE WATER FOR IRRIGATION IR SOLN
Status: DC | PRN
  Administered 2023-02-11: 1000 mL

## 2023-02-11 MED ORDER — HYDROMORPHONE HCL 1 MG/ML IJ SOLN
0.2500 mg | INTRAMUSCULAR | Status: DC | PRN
  Administered 2023-02-11 (×2): 0.5 mg via INTRAVENOUS

## 2023-02-11 MED ORDER — ROCURONIUM BROMIDE 10 MG/ML (PF) SYRINGE
PREFILLED_SYRINGE | INTRAVENOUS | Status: AC
  Filled 2023-02-11: qty 10

## 2023-02-11 MED ORDER — DEXAMETHASONE SODIUM PHOSPHATE 10 MG/ML IJ SOLN
INTRAMUSCULAR | Status: AC
  Filled 2023-02-11: qty 1

## 2023-02-11 MED ORDER — LIDOCAINE HCL (PF) 2 % IJ SOLN
INTRAMUSCULAR | Status: AC
  Filled 2023-02-11: qty 5

## 2023-02-11 MED ORDER — FENTANYL CITRATE (PF) 100 MCG/2ML IJ SOLN
INTRAMUSCULAR | Status: DC | PRN
  Administered 2023-02-11 (×3): 50 ug via INTRAVENOUS

## 2023-02-11 MED ORDER — SODIUM CHLORIDE 0.9% FLUSH: 3.0000 mL | Freq: Two times a day (BID) | INTRAVENOUS | Status: DC

## 2023-02-11 MED ORDER — AMISULPRIDE (ANTIEMETIC) 5 MG/2ML IV SOLN: 10.0000 mg | Freq: Once | INTRAVENOUS | Status: DC | PRN

## 2023-02-11 MED ORDER — CHLORHEXIDINE GLUCONATE 0.12 % MT SOLN
15.0000 mL | Freq: Once | OROMUCOSAL | Status: AC
  Administered 2023-02-11: 15 mL via OROMUCOSAL

## 2023-02-11 SURGICAL SUPPLY — 72 items
APPLICATOR SURGIFLO ENDO (HEMOSTASIS) IMPLANT
BAG LAPAROSCOPIC 12 15 PORT 16 (BASKET) IMPLANT
BAG RETRIEVAL 12/15 (BASKET)
BLADE SURG SZ10 CARB STEEL (BLADE) IMPLANT
COVER BACK TABLE 60X90IN (DRAPES) ×1 IMPLANT
COVER TIP SHEARS 8 DVNC (MISCELLANEOUS) ×1 IMPLANT
DERMABOND ADVANCED .7 DNX12 (GAUZE/BANDAGES/DRESSINGS) ×1 IMPLANT
DRAPE ARM DVNC X/XI (DISPOSABLE) ×4 IMPLANT
DRAPE COLUMN DVNC XI (DISPOSABLE) ×1 IMPLANT
DRAPE SHEET LG 3/4 BI-LAMINATE (DRAPES) ×1 IMPLANT
DRAPE SURG IRRIG POUCH 19X23 (DRAPES) ×1 IMPLANT
DRIVER NDL MEGA SUTCUT DVNCXI (INSTRUMENTS) ×1 IMPLANT
DRIVER NDLE MEGA SUTCUT DVNCXI (INSTRUMENTS) ×1
DRSG OPSITE POSTOP 4X6 (GAUZE/BANDAGES/DRESSINGS) IMPLANT
DRSG OPSITE POSTOP 4X8 (GAUZE/BANDAGES/DRESSINGS) IMPLANT
ELECT PENCIL ROCKER SW 15FT (MISCELLANEOUS) IMPLANT
ELECT REM PT RETURN 15FT ADLT (MISCELLANEOUS) ×1 IMPLANT
FORCEPS BPLR FENES DVNC XI (FORCEP) ×1 IMPLANT
FORCEPS PROGRASP DVNC XI (FORCEP) ×1 IMPLANT
GAUZE 4X4 16PLY ~~LOC~~+RFID DBL (SPONGE) ×1 IMPLANT
GLOVE BIO SURGEON STRL SZ 6 (GLOVE) ×4 IMPLANT
GLOVE BIO SURGEON STRL SZ 6.5 (GLOVE) ×1 IMPLANT
GLOVE BIOGEL PI IND STRL 6.5 (GLOVE) ×2 IMPLANT
GOWN STRL REUS W/ TWL LRG LVL3 (GOWN DISPOSABLE) ×4 IMPLANT
GRASPER SUT TROCAR 14GX15 (MISCELLANEOUS) IMPLANT
HOLDER FOLEY CATH W/STRAP (MISCELLANEOUS) IMPLANT
IRRIG SUCT STRYKERFLOW 2 WTIP (MISCELLANEOUS) ×1
IRRIGATION SUCT STRKRFLW 2 WTP (MISCELLANEOUS) ×1 IMPLANT
KIT PROCEDURE DVNC SI (MISCELLANEOUS) IMPLANT
KIT TURNOVER KIT A (KITS) IMPLANT
LIGASURE IMPACT 36 18CM CVD LR (INSTRUMENTS) IMPLANT
MANIPULATOR ADVINCU DEL 3.0 PL (MISCELLANEOUS) IMPLANT
MANIPULATOR ADVINCU DEL 3.5 PL (MISCELLANEOUS) IMPLANT
MANIPULATOR UTERINE 4.5 ZUMI (MISCELLANEOUS) IMPLANT
NDL HYPO 21X1.5 SAFETY (NEEDLE) ×1 IMPLANT
NDL INSUFFLATION 14GA 120MM (NEEDLE) IMPLANT
NDL SPNL 20GX3.5 QUINCKE YW (NEEDLE) IMPLANT
NEEDLE HYPO 21X1.5 SAFETY (NEEDLE) ×1
NEEDLE INSUFFLATION 14GA 120MM (NEEDLE)
NEEDLE SPNL 20GX3.5 QUINCKE YW (NEEDLE)
OBTURATOR OPTICAL STND 8 DVNC (TROCAR) ×1
OBTURATOR OPTICALSTD 8 DVNC (TROCAR) ×1 IMPLANT
PACK ROBOT GYN CUSTOM WL (TRAY / TRAY PROCEDURE) ×1 IMPLANT
PAD ARMBOARD 7.5X6 YLW CONV (MISCELLANEOUS) ×1 IMPLANT
PAD POSITIONING PINK XL (MISCELLANEOUS) ×1 IMPLANT
PORT ACCESS TROCAR AIRSEAL 12 (TROCAR) IMPLANT
SCISSORS MNPLR CVD DVNC XI (INSTRUMENTS) ×1 IMPLANT
SCRUB CHG 4% DYNA-HEX 4OZ (MISCELLANEOUS) ×2 IMPLANT
SEAL UNIV 5-12 XI (MISCELLANEOUS) ×4 IMPLANT
SET TRI-LUMEN FLTR TB AIRSEAL (TUBING) ×1 IMPLANT
SPIKE FLUID TRANSFER (MISCELLANEOUS) ×1 IMPLANT
SPONGE T-LAP 18X18 ~~LOC~~+RFID (SPONGE) IMPLANT
SURGIFLO W/THROMBIN 8M KIT (HEMOSTASIS) IMPLANT
SUT MNCRL AB 4-0 PS2 18 (SUTURE) IMPLANT
SUT PDS AB 1 TP1 54 (SUTURE) IMPLANT
SUT VIC AB 0 CT1 27XBRD ANTBC (SUTURE) IMPLANT
SUT VIC AB 2-0 CT1 TAPERPNT 27 (SUTURE) IMPLANT
SUT VIC AB 4-0 PS2 18 (SUTURE) ×2 IMPLANT
SUT VICRYL 0 27 CT2 27 ABS (SUTURE) ×1 IMPLANT
SUT VLOC 180 0 9IN GS21 (SUTURE) IMPLANT
SYR 10ML LL (SYRINGE) IMPLANT
SYS BAG RETRIEVAL 10MM (BASKET) ×1
SYS WOUND ALEXIS 18CM MED (MISCELLANEOUS)
SYSTEM BAG RETRIEVAL 10MM (BASKET) IMPLANT
SYSTEM WOUND ALEXIS 18CM MED (MISCELLANEOUS) IMPLANT
TOWEL OR NON WOVEN STRL DISP B (DISPOSABLE) IMPLANT
TRAP SPECIMEN MUCUS 40CC (MISCELLANEOUS) IMPLANT
TRAY FOLEY MTR SLVR 16FR STAT (SET/KITS/TRAYS/PACK) ×1 IMPLANT
TROCAR PORT AIRSEAL 5X120 (TROCAR) IMPLANT
UNDERPAD 30X36 HEAVY ABSORB (UNDERPADS AND DIAPERS) ×2 IMPLANT
WATER STERILE IRR 1000ML POUR (IV SOLUTION) ×1 IMPLANT
YANKAUER SUCT BULB TIP 10FT TU (MISCELLANEOUS) IMPLANT

## 2023-02-11 NOTE — Transfer of Care (Signed)
Immediate Anesthesia Transfer of Care Note  Patient: Forde Dandy  Procedure(s) Performed: XI ROBOTIC ASSISTED BILATERAL SALPINGO OOPHORECTOMY (Bilateral)  Patient Location: PACU  Anesthesia Type:General  Level of Consciousness: awake, alert , oriented, and patient cooperative  Airway & Oxygen Therapy: Patient connected to face mask oxygen  Post-op Assessment: Report given to RN, Post -op Vital signs reviewed and stable, and Patient moving all extremities X 4  Post vital signs: Reviewed and stable  Last Vitals:  Vitals Value Taken Time  BP 106/64   Temp    Pulse 84 02/11/23 1607  Resp 17 02/11/23 1607  SpO2 100 % 02/11/23 1607  Vitals shown include unfiled device data.  Last Pain:  Vitals:   02/11/23 1234  TempSrc:   PainSc: 0-No pain      Patients Stated Pain Goal: 6 (02/11/23 1234)  Complications: There were no known notable events for this encounter.

## 2023-02-11 NOTE — Anesthesia Postprocedure Evaluation (Signed)
Anesthesia Post Note  Patient: Rachel Singleton  Procedure(s) Performed: XI ROBOTIC ASSISTED BILATERAL SALPINGO OOPHORECTOMY (Bilateral)     Patient location during evaluation: PACU Anesthesia Type: General Level of consciousness: awake and alert and oriented Pain management: pain level controlled Vital Signs Assessment: post-procedure vital signs reviewed and stable Respiratory status: spontaneous breathing, nonlabored ventilation and respiratory function stable Cardiovascular status: blood pressure returned to baseline and stable Postop Assessment: no apparent nausea or vomiting Anesthetic complications: no   There were no known notable events for this encounter.  Last Vitals:  Vitals:   02/11/23 1645 02/11/23 1648  BP: 109/65   Pulse: 62 (!) 59  Resp: 13 12  Temp:    SpO2: 100% 100%    Last Pain:  Vitals:   02/11/23 1648  TempSrc:   PainSc: 6                  Khalia Gong A.

## 2023-02-11 NOTE — Discharge Instructions (Addendum)
AFTER SURGERY INSTRUCTIONS   Return to work: 4-6 weeks if applicable   Activity: 1. Be up and out of the bed during the day.  Take a nap if needed.  You may walk up steps but be careful and use the hand rail.  Stair climbing will tire you more than you think, you may need to stop part way and rest.    2. No lifting or straining for 4-6 weeks over 10 pounds. No pushing, pulling, straining for 4-6 weeks.   3. No driving for around 6-64 days based on when the following criteria have been met: Do not drive if you are taking narcotic pain medicine and make sure that your reaction time has returned.    4. You can shower as soon as the next day after surgery. Shower daily.  Use your regular soap and water (not directly on the incision) and pat your incision(s) dry afterwards; don't rub.  No tub baths or submerging your body in water until cleared by your surgeon. If you have the soap that was given to you by pre-surgical testing that was used before surgery, you do not need to use it afterwards because this can irritate your incisions.    5. No sexual activity and nothing in the vagina for 6 weeks.   6. You may experience a small amount of clear drainage from your incisions, which is normal.  If the drainage persists, increases, or changes color please call the office.   7. Do not use creams, lotions, or ointments such as neosporin on your incisions after surgery until advised by your surgeon because they can cause removal of the dermabond glue on your incisions.     8. You may experience vaginal spotting after surgery. The spotting is normal but if you experience heavy bleeding, call our office.   9. Take Tylenol or ibuprofen first for pain if you are able to take these medications and only use narcotic pain medication for severe pain not relieved by the Tylenol or Ibuprofen.  Monitor your Tylenol intake to a max of 4,000 mg in a 24 hour period. You can alternate these medications after surgery.    Diet: 1. Low sodium Heart Healthy Diet is recommended but you are cleared to resume your normal (before surgery) diet after your procedure.   2. It is safe to use a laxative, such as Miralax or Colace, if you have difficulty moving your bowels. You have been prescribed Sennakot-S to take at bedtime every evening after surgery to keep bowel movements regular and to prevent constipation.     Wound Care: 1. Keep clean and dry.  Shower daily.   Reasons to call the Doctor: Fever - Oral temperature greater than 100.4 degrees Fahrenheit Foul-smelling vaginal discharge Difficulty urinating Nausea and vomiting Increased pain at the site of the incision that is unrelieved with pain medicine. Difficulty breathing with or without chest pain New calf pain especially if only on one side Sudden, continuing increased vaginal bleeding with or without clots.   Contacts: For questions or concerns you should contact:   Dr. Clide Cliff at [phone]   Warner Mccreedy, NP at [phone]   After Hours: call [phone] and have the GYN Oncologist paged/contacted (after 5 pm or on the weekends). You will speak with an after hours RN and let he or she know you have had surgery.   Messages sent via mychart are for non-urgent matters and are not responded to after hours so for urgent needs, please  call the after hours number.

## 2023-02-11 NOTE — Anesthesia Preprocedure Evaluation (Addendum)
Anesthesia Evaluation  Patient identified by MRN, date of birth, ID band Patient awake    Reviewed: Allergy & Precautions, NPO status , Patient's Chart, lab work & pertinent test results  Airway Mallampati: II  TM Distance: >3 FB     Dental no notable dental hx. (+) Teeth Intact, Dental Advisory Given   Pulmonary asthma , former smoker   Pulmonary exam normal breath sounds clear to auscultation       Cardiovascular negative cardio ROS Normal cardiovascular exam Rhythm:Regular Rate:Normal     Neuro/Psych negative neurological ROS  negative psych ROS   GI/Hepatic negative GI ROS, Neg liver ROS,,,  Endo/Other  Hx/o Right Breast Ca S/P mastectomy ChemoRx  Renal/GU negative Renal ROS  negative genitourinary   Musculoskeletal negative musculoskeletal ROS (+)    Abdominal   Peds  Hematology negative hematology ROS (+)   Anesthesia Other Findings   Reproductive/Obstetrics negative OB ROS                              Anesthesia Physical Anesthesia Plan  ASA: 2  Anesthesia Plan: General   Post-op Pain Management: Precedex, Dilaudid IV and Ofirmev IV (intra-op)*   Induction: Intravenous  PONV Risk Score and Plan: 4 or greater and Treatment may vary due to age or medical condition, Midazolam, Scopolamine patch - Pre-op, Dexamethasone and Ondansetron  Airway Management Planned: Oral ETT  Additional Equipment: None  Intra-op Plan:   Post-operative Plan: Extubation in OR  Informed Consent: I have reviewed the patients History and Physical, chart, labs and discussed the procedure including the risks, benefits and alternatives for the proposed anesthesia with the patient or authorized representative who has indicated his/her understanding and acceptance.     Dental advisory given  Plan Discussed with: Anesthesiologist and CRNA  Anesthesia Plan Comments:          Anesthesia  Quick Evaluation

## 2023-02-11 NOTE — Op Note (Signed)
GYNECOLOGIC ONCOLOGY OPERATIVE NOTE  Date of Service: 02/11/2023  Preoperative Diagnosis: ER/PR+ breast cancer  Postoperative Diagnosis: Same  Procedures: Robotic assisted laparoscopic bilateral salpingo-oophorectomy  Surgeon: Clide Cliff, MD  Assistants: Antionette Char, MD and (an MD assistant was necessary for tissue manipulation, management of robotic instrumentation, retraction and positioning due to the complexity of the case and hospital policies)  Anesthesia: General  Estimated Blood Loss: 25 mL    Fluids: 1000 ml, crystalloid  Urine Output: 150 ml, clear yellow  Findings: On entry to abdomen, normal upper abdominal survey with smooth diaphragm, liver, stomach and normal appearing omentum and bowel. Normal small uterus with normal appearing bilateral fallopian tubes and ovaries.   Specimens:  ID Type Source Tests Collected by Time Destination  1 : Bilateral Fallopian Tubes and Ovaries Tissue PATH Gyn benign resection SURGICAL PATHOLOGY Clide Cliff, MD 02/11/2023 1439     Complications:  None  Indications for Procedure: Rachel Singleton is a 42 y.o. woman with hormone receptor positive breast cancer who desires therapeutic BSO.  Prior to the procedure, all risks, benefits, and alternatives were discussed and informed surgical consent was signed.  Procedure: Patient was taken to the operating room where general anesthesia was achieved.  She was positioned in dorsal lithotomy and prepped and draped.  A foley catheter was inserted into the bladder.   A 12 mm incision was made in the left upper quadrant near Palmer's point.  The abdomen was entered with a 5 mm OptiView trocar under direct visualization.  The abdomen was insufflated, the patient placed in steep Trendelenburg, and additional trocars were placed as follows: an 8mm robotic trocar superior to the umbilicus, one 8 mm robotic trocar in the right abdomen, and one 8 mm robotic trocar in the left abdomen.  The  left upper quadrant trocar was removed and replaced with a 12 mm airseal trocar.  All trocars were placed under direct visualization.  The bowels were moved into the upper abdomen.  The DaVinci robotic surgical system was brought to the patient's bedside and docked.  The right retroperitoneum was entered.  The right ureter was identified.  The right infundibulopelvic ligament was isolated, cauterized, and transected.  The broad ligament was incised to the uterine cornu.  The utero-ovarian ligament and the proximal fallopian tube were isolated, cauterized, and transected.  The same procedure was performed on the contralateral side.  Both specimens were placed into an Endo-Catch bag and removed through the 10 mm trocar.  The pelvis was inspected and all operative sites were found to be hemostatic.  All instruments were removed and the robot was taken from the patient's bedside. The fascia at the 12 mm incision was closed with 0 Vicryl with the assistance of a PMI device. The abdomen was desufflated and all ports were removed. The skin at all incisions was closed with 4-0 Vicryl to reapproximate the subcutaneous tissue and 4-0 monocryl in a subcuticular fashion followed by surgical glue.  Patient tolerated the procedure well. Sponge, lap, and instrument counts were correct.  No perioperative antibiotic prophylaxis was indicated for this procedure.  She was extubated and taken to the PACU in stable condition.  Clide Cliff, MD Gynecologic Oncology

## 2023-02-11 NOTE — Anesthesia Procedure Notes (Signed)
Procedure Name: Intubation Date/Time: 02/11/2023 3:14 PM  Performed by: Sharyn Dross, CRNAPre-anesthesia Checklist: Patient identified, Emergency Drugs available, Suction available and Patient being monitored Patient Re-evaluated:Patient Re-evaluated prior to induction Oxygen Delivery Method: Circle system utilized Preoxygenation: Pre-oxygenation with 100% oxygen Induction Type: IV induction Ventilation: Mask ventilation without difficulty Laryngoscope Size: Mac and 4 Grade View: Grade I Tube type: Oral Tube size: 7.0 mm Number of attempts: 1 Airway Equipment and Method: Stylet and Oral airway Placement Confirmation: ETT inserted through vocal cords under direct vision, positive ETCO2 and breath sounds checked- equal and bilateral Secured at: 22 cm Tube secured with: Tape Dental Injury: Teeth and Oropharynx as per pre-operative assessment

## 2023-02-11 NOTE — Interval H&P Note (Signed)
History and Physical Interval Note:  02/11/2023 1:57 PM  Rachel Singleton  has presented today for surgery, with the diagnosis of ER/PR POSITIVE BREAST CANCER.  The various methods of treatment have been discussed with the patient and family. After consideration of risks, benefits and other options for treatment, the patient has consented to  Procedure(s): XI ROBOTIC ASSISTED BILATERAL SALPINGO OOPHORECTOMY (Bilateral) as a surgical intervention.  The patient's history has been reviewed, patient examined, no change in status, stable for surgery.  I have reviewed the patient's chart and labs.  Questions were answered to the patient's satisfaction.     Sebastion Jun

## 2023-02-12 ENCOUNTER — Encounter (HOSPITAL_COMMUNITY): Payer: Self-pay | Admitting: Psychiatry

## 2023-02-12 ENCOUNTER — Telehealth: Payer: Self-pay | Admitting: *Deleted

## 2023-02-12 NOTE — Telephone Encounter (Signed)
Spoke with Ms. Lant this morning. She states she is eating, drinking and urinating well. She has not had a BM yet but is passing gas. She is taking senokot as prescribed and encouraged her to drink plenty of water. She denies fever or chills. Incisions are dry and intact. She rates her pain 5/10. Her pain is controlled with oxycodone yesterday and today she has only had ibuprofen.     Instructed to call office with any fever, chills, purulent drainage, uncontrolled pain or any other questions or concerns. Patient verbalizes understanding.   Pt aware of post op appointments as well as the office number [phone] and after hours number [phone] to call if she has any questions or concerns

## 2023-02-14 LAB — SURGICAL PATHOLOGY

## 2023-02-18 ENCOUNTER — Encounter: Payer: Self-pay | Admitting: Hematology and Oncology

## 2023-02-21 ENCOUNTER — Other Ambulatory Visit: Payer: Self-pay

## 2023-02-21 ENCOUNTER — Encounter (HOSPITAL_BASED_OUTPATIENT_CLINIC_OR_DEPARTMENT_OTHER): Payer: Self-pay | Admitting: Plastic Surgery

## 2023-02-24 ENCOUNTER — Encounter: Payer: Self-pay | Admitting: Psychiatry

## 2023-02-24 ENCOUNTER — Other Ambulatory Visit: Payer: Self-pay

## 2023-02-24 ENCOUNTER — Inpatient Hospital Stay: Payer: Self-pay | Attending: Hematology & Oncology | Admitting: Psychiatry

## 2023-02-24 VITALS — BP 105/69 | HR 80 | Temp 98.7°F | Resp 20 | Wt 131.2 lb

## 2023-02-24 DIAGNOSIS — Z90722 Acquired absence of ovaries, bilateral: Secondary | ICD-10-CM

## 2023-02-24 DIAGNOSIS — D259 Leiomyoma of uterus, unspecified: Secondary | ICD-10-CM

## 2023-02-24 DIAGNOSIS — C50911 Malignant neoplasm of unspecified site of right female breast: Secondary | ICD-10-CM

## 2023-02-24 NOTE — Progress Notes (Signed)
Gynecologic Oncology Return Clinic Visit  Date of Service: 02/24/2023 Referring Provider:  Lillard Anes, NP   Assessment & Plan: Rachel Singleton is a 42 y.o. woman with ER/PR+ breast cancer who is s/p therapeutic RA-BSO on 02/11/23.  Postop: - Pt recovering well from surgery and healing appropriately postoperatively - Intraoperative findings and pathology results reviewed. - Ongoing postoperative expectations and precautions reviewed. Restrictions lifted. - Okay to return to routine care.  RTC PRN.  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 Report date is 09/17/2022.  The CancerNext-Expanded gene panel offered by [facility] and includes sequencing, rearrangement, and RNA analysis for the following 71 genes: AIP, ALK, APC, ATM, AXIN2, BAP1, BARD1, BMPR1A, BRCA1, BRCA2, BRIP1, CDC73, CDH1, CDK4, CDKN1B, CDKN2A, CHEK2, CTNNA1, DICER1, FH, FLCN, KIF1B, LZTR1, MAX, MEN1, MET, MLH1, MSH2, MSH3, MSH6, MUTYH, NF1, NF2, NTHL1, PALB2, PHOX2B, PMS2, POT1, PRKAR1A, PTCH1, PTEN, RAD51C, RAD51D, RB1, RET, SDHA, SDHAF2, SDHB, SDHC, SDHD, SMAD4, SMARCA4, SMARCB1, SMARCE1, STK11, SUFU, TMEM127, TP53, TSC1, TSC2, and VHL (sequencing and deletion/duplication); EGFR,  EGLN1, HOXB13, KIT, MITF, PDGFRA, POLD1, and POLE (sequencing only); EPCAM and GREM1 (deletion/duplication only).    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 Oncotype DX score 25   11/08/2022 -  Chemotherapy   Patient is on Treatment Plan : BREAST TC q21d       Interval History: Pt reports that she is recovering well from surgery. She is not having pain pain. She is eating and drinking well. She is voiding without issue and having regular bowel movements.    Past Medical/Surgical History: Past Medical History:  Diagnosis Date   Asthma    pregnancy only   Breast cancer (HCC) 08/21/2022   Former smoker 03/18/2006   Quit 2008   Hx of varicella    Pneumonia    few months ago    Past Surgical History:  Procedure Laterality Date   BREAST BIOPSY Right 08/21/2022   MM RT BREAST BX W LOC DEV 1ST LESION IMAGE BX SPEC STEREO GUIDE 08/21/2022 [facility]   BREAST BIOPSY Right 08/21/2022   MM RT BREAST BX W LOC DEV EA AD LESION IMG BX SPEC STEREO GUIDE 08/21/2022 [facility]   BREAST BIOPSY Right 08/21/2022   [place] RT BREAST BX W LOC DEV 1ST LESION IMG BX SPEC US GUIDE 08/21/2022 [facility]   BREAST RECONSTRUCTION WITH PLACEMENT OF TISSUE EXPANDER AND ALLODERM Bilateral 09/17/2022   Procedure: BREAST RECONSTRUCTION WITH PLACEMENT OF TISSUE EXPANDER AND ALLODERM;  Surgeon: Glenna Fellows, MD;  Location: [facility];  Service: Plastics;  Laterality: Bilateral;   MASTECTOMY W/ SENTINEL NODE BIOPSY Right 09/17/2022   Procedure: RIGHT MASTECTOMY WITH SENTINEL  LYMPH NODE BIOPSY;  Surgeon: Manus Rudd, MD;  Location: [facility];  Service: General;  Laterality: Right;   PORT-A-CATH REMOVAL N/A 02/06/2023   Procedure: REMOVAL PORT-A-CATH;   Surgeon: Manus Rudd, MD;  Location: [facility];  Service: General;  Laterality: N/A;   PORTACATH PLACEMENT N/A 11/07/2022   Procedure: ULTRASOUND-GUIDED PORT PLACEMENT;  Surgeon: Manus Rudd, MD;  Location: [facility];  Service: General;  Laterality: N/A;  LMA   ROBOTIC ASSISTED BILATERAL SALPINGO OOPHERECTOMY Bilateral 02/11/2023   Procedure: XI ROBOTIC ASSISTED BILATERAL SALPINGO OOPHORECTOMY;  Surgeon: Clide Cliff, MD;  Location: [facility];  Service: Gynecology;  Laterality: Bilateral;   SIMPLE MASTECTOMY WITH AXILLARY SENTINEL NODE BIOPSY Left 09/17/2022   Procedure: LEFT SIMPLE MASTECTOMY;  Surgeon: Manus Rudd, MD;  Location: [facility];  Service: General;  Laterality: Left;   WISDOM TOOTH EXTRACTION      Family History  Problem Relation Age of Onset   Breast cancer Mother 58       DCIS (-/-)   Heart disease Father    Heart attack Father    Cystic fibrosis Brother    Colon cancer Maternal Grandfather 99 - 75   Skin cancer Maternal Aunt 60 - 69   Breast cancer Paternal Aunt 60 - 69   Breast cancer Other 50 - 59   Ovarian cancer Neg Hx    Endometrial cancer Neg Hx     Social History   Socioeconomic History   Marital status: Married    Spouse name: Not on file   Number of children: Not on file   Years of education: Not on file   Highest education level: Not on file  Occupational History   Not on file  Tobacco Use   Smoking status: Former    Types: Cigarettes    Passive exposure: Past   Smokeless tobacco: Never  Vaping Use   Vaping status: Never Used  Substance and Sexual Activity   Alcohol use: No   Drug use: No   Sexual activity: Yes    Birth control/protection: None  Other Topics Concern   Not on file  Social History Narrative   ** Merged History Encounter **       Social Determinants of Health   Financial Resource Strain: Not on file  Food Insecurity: No Food Insecurity (09/04/2022)   Hunger Vital Sign    Worried About  Running Out of Food in the Last Year: Never true    Ran Out of Food in the Last Year: Never true  Transportation Needs: No Transportation Needs (09/04/2022)   PRAPARE - Administrator, Civil Service (Medical): No    Lack of Transportation (Non-Medical): No  Physical Activity: Not on file  Stress: Not on file  Social Connections: Unknown (07/31/2021)   Received from [facility]   Social Network    Social Network: Not on file    Current Medications:  Current Outpatient Medications:    anastrozole (ARIMIDEX) 1 MG tablet, Take 1 tablet (1 mg total) by mouth daily. (Patient not taking: Reported on 02/18/2023), Disp: 90 tablet, Rfl: 3   oxyCODONE (OXY IR/ROXICODONE) 5 MG immediate release tablet, Take 1 tablet (5 mg total) by mouth every 4 (four) hours as needed for severe pain (pain score 7-10). For AFTER surgery only, do not take and drive, Disp: 15 tablet, Rfl: 0  Review of Symptoms: Complete 10-system review is negative except as above in Interval History.  Physical Exam: LMP  10/31/2022 (Approximate) Comment: Pregnancy test negative (-) 02/11/23 General: Alert, oriented, no acute distress. HEENT: Normocephalic, atraumatic. Neck symmetric without masses. Sclera anicteric.  Chest: Normal work of breathing.   Abdomen: Soft, nontender.  Normoactive bowel sounds.  No masses appreciated.  Well-healing incisions. Extremities: Grossly normal range of motion.  Warm, well perfused.  No edema bilaterally. Skin: No rashes or lesions noted.  Laboratory & Radiologic Studies: Surgical pathology (02/11/23): A. OVARIES AND FALLOPIAN TUBES, BILATERAL, SALPINGO OOPHORECTOMY:   Bilateral fallopian tubes:            Benign fimbriated fallopian tubes with paratubal cysts.            Negative for malignancy.        Bilateral ovaries:            Benign ovarian parenchyma with hemorrhage follicle cysts.            Negative for malignancy.

## 2023-02-24 NOTE — Patient Instructions (Signed)
It was a pleasure to see you in clinic today. - Safe to return to normal activities - Return to routine care  Thank you very much for allowing me to provide care for you today.  I appreciate your confidence in choosing our Gynecologic Oncology team at [facility].  If you have any questions about your visit today please call our office or send [place] a MyChart message and we will get back to you as soon as possible.

## 2023-02-27 ENCOUNTER — Telehealth: Payer: Self-pay | Admitting: *Deleted

## 2023-02-27 ENCOUNTER — Other Ambulatory Visit: Payer: Self-pay | Admitting: Gynecologic Oncology

## 2023-02-27 ENCOUNTER — Encounter: Payer: Self-pay | Admitting: Psychiatry

## 2023-02-27 DIAGNOSIS — N898 Other specified noninflammatory disorders of vagina: Secondary | ICD-10-CM

## 2023-02-27 MED ORDER — FLUCONAZOLE 100 MG PO TABS: 100.0000 mg | ORAL_TABLET | Freq: Once | ORAL | 0 refills | Status: AC

## 2023-02-27 NOTE — Telephone Encounter (Signed)
-----   Message from Doylene Bode sent at 02/27/2023  3:59 PM EST ----- I would take the diflucan tonight or after surgery, not the morning of surgery

## 2023-02-27 NOTE — Progress Notes (Signed)
See mychart message. Patient messaged about vaginal discharge and itching.

## 2023-02-27 NOTE — Telephone Encounter (Addendum)
Spoke with Ms. Curbow in regards to message left through MyChart. Pt states she started having a clear yellow discharge two days ago after sexual intercourse. Pt is two weeks post op on 12/10.   Pt denies the discharge has an odor. Reports no fever, chills, pain, bleeding and all urinary symptoms. Pt reports having some vaginal itching, "feels like a yeast infection".   Pt advised that diflucan has been called into her pharmacy, but she can wait until after her surgery tomorrow to take it.  Pt also advised to call the office with any increased vaginal discharge that has an odor, bleeding and or accompanies any new symptoms. Also reinforced nothing in the vagina for 6-8 weeks and follow post op activity restrictions per after visit summary. Pt verbalized understanding and thanked the office for calling.

## 2023-02-28 ENCOUNTER — Encounter (HOSPITAL_BASED_OUTPATIENT_CLINIC_OR_DEPARTMENT_OTHER)
Admission: RE | Disposition: A | Discharge status: Home / Self Care | Payer: Self-pay | Source: Ambulatory Visit | Attending: Plastic Surgery

## 2023-02-28 ENCOUNTER — Other Ambulatory Visit: Payer: Self-pay

## 2023-02-28 ENCOUNTER — Ambulatory Visit (HOSPITAL_BASED_OUTPATIENT_CLINIC_OR_DEPARTMENT_OTHER): Payer: Self-pay | Admitting: Anesthesiology

## 2023-02-28 ENCOUNTER — Encounter (HOSPITAL_BASED_OUTPATIENT_CLINIC_OR_DEPARTMENT_OTHER): Payer: Self-pay | Admitting: Plastic Surgery

## 2023-02-28 ENCOUNTER — Ambulatory Visit (HOSPITAL_BASED_OUTPATIENT_CLINIC_OR_DEPARTMENT_OTHER)
Admission: RE | Admit: 2023-02-28 | Discharge: 2023-02-28 | Disposition: A | Discharge status: Home / Self Care | Payer: Self-pay | Source: Ambulatory Visit | Attending: Plastic Surgery | Admitting: Plastic Surgery

## 2023-02-28 DIAGNOSIS — Z1721 Progesterone receptor positive status: Secondary | ICD-10-CM | POA: Insufficient documentation

## 2023-02-28 DIAGNOSIS — Z01818 Encounter for other preprocedural examination: Secondary | ICD-10-CM

## 2023-02-28 DIAGNOSIS — Z9013 Acquired absence of bilateral breasts and nipples: Secondary | ICD-10-CM | POA: Insufficient documentation

## 2023-02-28 DIAGNOSIS — Z87891 Personal history of nicotine dependence: Secondary | ICD-10-CM | POA: Insufficient documentation

## 2023-02-28 DIAGNOSIS — Z421 Encounter for breast reconstruction following mastectomy: Secondary | ICD-10-CM | POA: Insufficient documentation

## 2023-02-28 DIAGNOSIS — Z803 Family history of malignant neoplasm of breast: Secondary | ICD-10-CM | POA: Insufficient documentation

## 2023-02-28 DIAGNOSIS — Z9221 Personal history of antineoplastic chemotherapy: Secondary | ICD-10-CM | POA: Insufficient documentation

## 2023-02-28 DIAGNOSIS — C50211 Malignant neoplasm of upper-inner quadrant of right female breast: Secondary | ICD-10-CM | POA: Insufficient documentation

## 2023-02-28 DIAGNOSIS — Z170 Estrogen receptor positive status [ER+]: Secondary | ICD-10-CM | POA: Insufficient documentation

## 2023-02-28 HISTORY — PX: REMOVAL OF BILATERAL TISSUE EXPANDERS WITH PLACEMENT OF BILATERAL BREAST IMPLANTS: SHX6431

## 2023-02-28 LAB — POCT PREGNANCY, URINE: Preg Test, Ur: NEGATIVE

## 2023-02-28 SURGERY — REMOVAL, TISSUE EXPANDER, BREAST, BILATERAL, WITH BILATERAL IMPLANT IMPLANT INSERTION
Anesthesia: General | Site: Chest | Laterality: Bilateral

## 2023-02-28 MED ORDER — FENTANYL CITRATE (PF) 100 MCG/2ML IJ SOLN
INTRAMUSCULAR | Status: DC | PRN
  Administered 2023-02-28 (×2): 50 ug via INTRAVENOUS

## 2023-02-28 MED ORDER — CHLORHEXIDINE GLUCONATE CLOTH 2 % EX PADS: 6.0000 | MEDICATED_PAD | Freq: Once | CUTANEOUS | Status: DC

## 2023-02-28 MED ORDER — ACETAMINOPHEN 500 MG PO TABS
1000.0000 mg | ORAL_TABLET | ORAL | Status: AC
  Administered 2023-02-28: 1000 mg via ORAL

## 2023-02-28 MED ORDER — CELECOXIB 200 MG PO CAPS
ORAL_CAPSULE | ORAL | Status: AC
  Filled 2023-02-28: qty 1

## 2023-02-28 MED ORDER — DEXAMETHASONE SODIUM PHOSPHATE 10 MG/ML IJ SOLN
INTRAMUSCULAR | Status: AC
  Filled 2023-02-28: qty 1

## 2023-02-28 MED ORDER — SODIUM CHLORIDE 0.9 % IV SOLN
INTRAVENOUS | Status: DC | PRN
  Administered 2023-02-28: 500 mL

## 2023-02-28 MED ORDER — CEFAZOLIN SODIUM-DEXTROSE 2-4 GM/100ML-% IV SOLN
2.0000 g | INTRAVENOUS | Status: AC
  Administered 2023-02-28: 2 g via INTRAVENOUS

## 2023-02-28 MED ORDER — CELECOXIB 200 MG PO CAPS
200.0000 mg | ORAL_CAPSULE | ORAL | Status: AC
Start: 2023-02-28 — End: 2023-02-28
  Administered 2023-02-28: 200 mg via ORAL

## 2023-02-28 MED ORDER — LACTATED RINGERS IV SOLN: INTRAVENOUS | Status: DC

## 2023-02-28 MED ORDER — POVIDONE-IODINE 10 % EX SOLN
CUTANEOUS | Status: DC | PRN
  Administered 2023-02-28: 1 via TOPICAL

## 2023-02-28 MED ORDER — ONDANSETRON HCL 4 MG/2ML IJ SOLN
INTRAMUSCULAR | Status: AC
  Filled 2023-02-28: qty 2

## 2023-02-28 MED ORDER — BUPIVACAINE HCL (PF) 0.5 % IJ SOLN
INTRAMUSCULAR | Status: AC
  Filled 2023-02-28: qty 30

## 2023-02-28 MED ORDER — ACETAMINOPHEN 500 MG PO TABS
ORAL_TABLET | ORAL | Status: AC
  Filled 2023-02-28: qty 2

## 2023-02-28 MED ORDER — DEXAMETHASONE SODIUM PHOSPHATE 4 MG/ML IJ SOLN
INTRAMUSCULAR | Status: DC | PRN
  Administered 2023-02-28: 5 mg via INTRAVENOUS

## 2023-02-28 MED ORDER — LIDOCAINE 2% (20 MG/ML) 5 ML SYRINGE
INTRAMUSCULAR | Status: AC
  Filled 2023-02-28: qty 5

## 2023-02-28 MED ORDER — GABAPENTIN 300 MG PO CAPS
300.0000 mg | ORAL_CAPSULE | ORAL | Status: AC
  Administered 2023-02-28: 300 mg via ORAL

## 2023-02-28 MED ORDER — LIDOCAINE HCL (CARDIAC) PF 100 MG/5ML IV SOSY
PREFILLED_SYRINGE | INTRAVENOUS | Status: DC | PRN
  Administered 2023-02-28: 60 mg via INTRAVENOUS

## 2023-02-28 MED ORDER — SULFAMETHOXAZOLE-TRIMETHOPRIM 800-160 MG PO TABS
1.0000 | ORAL_TABLET | Freq: Two times a day (BID) | ORAL | 0 refills | Status: DC
Start: 2023-02-28 — End: 2023-04-02

## 2023-02-28 MED ORDER — PROPOFOL 10 MG/ML IV BOLUS
INTRAVENOUS | Status: DC | PRN
  Administered 2023-02-28: 150 mg via INTRAVENOUS

## 2023-02-28 MED ORDER — CEFAZOLIN SODIUM-DEXTROSE 2-4 GM/100ML-% IV SOLN
INTRAVENOUS | Status: AC
  Filled 2023-02-28: qty 100

## 2023-02-28 MED ORDER — ATROPINE SULFATE 0.4 MG/ML IV SOLN
INTRAVENOUS | Status: AC
  Filled 2023-02-28: qty 2

## 2023-02-28 MED ORDER — FENTANYL CITRATE (PF) 100 MCG/2ML IJ SOLN
INTRAMUSCULAR | Status: AC
  Filled 2023-02-28: qty 2

## 2023-02-28 MED ORDER — GABAPENTIN 300 MG PO CAPS
ORAL_CAPSULE | ORAL | Status: AC
  Filled 2023-02-28: qty 1

## 2023-02-28 MED ORDER — MIDAZOLAM HCL 5 MG/5ML IJ SOLN
INTRAMUSCULAR | Status: DC | PRN
  Administered 2023-02-28: 2 mg via INTRAVENOUS

## 2023-02-28 MED ORDER — 0.9 % SODIUM CHLORIDE (POUR BTL) OPTIME
TOPICAL | Status: DC | PRN
Start: 2023-02-28 — End: 2023-02-28
  Administered 2023-02-28: 1000 mL

## 2023-02-28 MED ORDER — DIPHENHYDRAMINE HCL 50 MG/ML IJ SOLN
INTRAMUSCULAR | Status: DC | PRN
  Administered 2023-02-28: 12.5 mg via INTRAVENOUS

## 2023-02-28 MED ORDER — SUCCINYLCHOLINE CHLORIDE 200 MG/10ML IV SOSY
PREFILLED_SYRINGE | INTRAVENOUS | Status: AC
  Filled 2023-02-28: qty 10

## 2023-02-28 MED ORDER — EPHEDRINE SULFATE (PRESSORS) 50 MG/ML IJ SOLN
INTRAMUSCULAR | Status: DC | PRN
  Administered 2023-02-28: 10 mg via INTRAVENOUS

## 2023-02-28 MED ORDER — PHENYLEPHRINE 80 MCG/ML (10ML) SYRINGE FOR IV PUSH (FOR BLOOD PRESSURE SUPPORT)
PREFILLED_SYRINGE | INTRAVENOUS | Status: AC
  Filled 2023-02-28: qty 10

## 2023-02-28 MED ORDER — SODIUM CHLORIDE 0.9 % IV SOLN
INTRAVENOUS | Status: AC
  Filled 2023-02-28: qty 10

## 2023-02-28 MED ORDER — HYDROMORPHONE HCL 1 MG/ML IJ SOLN: 0.2500 mg | INTRAMUSCULAR | Status: DC | PRN

## 2023-02-28 MED ORDER — MIDAZOLAM HCL 2 MG/2ML IJ SOLN
INTRAMUSCULAR | Status: AC
  Filled 2023-02-28: qty 2

## 2023-02-28 MED ORDER — SODIUM CHLORIDE 0.9 % IV SOLN
INTRAVENOUS | Status: AC | PRN
  Administered 2023-02-28: 670 mL via INTRAMUSCULAR

## 2023-02-28 MED ORDER — BUPIVACAINE HCL (PF) 0.5 % IJ SOLN
INTRAMUSCULAR | Status: DC | PRN
  Administered 2023-02-28: 30 mL

## 2023-02-28 MED ORDER — SODIUM CHLORIDE 0.9 % IV SOLN: INTRAVENOUS | Status: DC | PRN

## 2023-02-28 MED ORDER — EPHEDRINE 5 MG/ML INJ
INTRAVENOUS | Status: AC
  Filled 2023-02-28: qty 5

## 2023-02-28 SURGICAL SUPPLY — 61 items
BAG DECANTER FOR FLEXI CONT (MISCELLANEOUS) ×1 IMPLANT
BINDER BREAST 3XL (GAUZE/BANDAGES/DRESSINGS) IMPLANT
BINDER BREAST LRG (GAUZE/BANDAGES/DRESSINGS) IMPLANT
BINDER BREAST MEDIUM (GAUZE/BANDAGES/DRESSINGS) IMPLANT
BINDER BREAST XLRG (GAUZE/BANDAGES/DRESSINGS) IMPLANT
BINDER BREAST XXLRG (GAUZE/BANDAGES/DRESSINGS) IMPLANT
BLADE SURG 10 STRL SS (BLADE) ×2 IMPLANT
BNDG GAUZE DERMACEA FLUFF 4 (GAUZE/BANDAGES/DRESSINGS) ×2 IMPLANT
CANISTER SUCT 1200ML W/VALVE (MISCELLANEOUS) ×1 IMPLANT
CHLORAPREP W/TINT 26 (MISCELLANEOUS) ×2 IMPLANT
COVER BACK TABLE 60X90IN (DRAPES) ×1 IMPLANT
COVER MAYO STAND STRL (DRAPES) ×1 IMPLANT
DERMABOND ADVANCED .7 DNX12 (GAUZE/BANDAGES/DRESSINGS) ×2 IMPLANT
DRAIN CHANNEL 15F RND FF W/TCR (WOUND CARE) IMPLANT
DRAPE TOP ARMCOVERS (MISCELLANEOUS) ×1 IMPLANT
DRAPE U-SHAPE 76X120 STRL (DRAPES) ×1 IMPLANT
DRAPE UTILITY XL STRL (DRAPES) ×1 IMPLANT
ELECT BLADE 4.0 EZ CLEAN MEGAD (MISCELLANEOUS) IMPLANT
ELECT COATED BLADE 2.86 ST (ELECTRODE) ×1 IMPLANT
ELECT REM PT RETURN 9FT ADLT (ELECTROSURGICAL) ×1 IMPLANT
ELECTRODE BLDE 4.0 EZ CLN MEGD (MISCELLANEOUS) IMPLANT
ELECTRODE REM PT RTRN 9FT ADLT (ELECTROSURGICAL) ×1 IMPLANT
EVACUATOR SILICONE 100CC (DRAIN) IMPLANT
GAUZE PAD ABD 8X10 STRL (GAUZE/BANDAGES/DRESSINGS) ×2 IMPLANT
GLOVE BIO SURGEON STRL SZ 6 (GLOVE) ×2 IMPLANT
GLOVE BIO SURGEON STRL SZ 6.5 (GLOVE) IMPLANT
GLOVE BIOGEL PI IND STRL 7.0 (GLOVE) IMPLANT
GOWN STRL REUS W/ TWL LRG LVL3 (GOWN DISPOSABLE) ×2 IMPLANT
IMPL BREAST SAL 320-340CC (Breast) IMPLANT
IMPLANT BREAST SAL 320-340CC (Breast) ×2 IMPLANT
KIT FILL ASEPTIC TRANSFER (MISCELLANEOUS) IMPLANT
MARKER SKIN DUAL TIP RULER LAB (MISCELLANEOUS) IMPLANT
NDL FILTER BLUNT 18X1 1/2 (NEEDLE) IMPLANT
NDL HYPO 25X1 1.5 SAFETY (NEEDLE) IMPLANT
NEEDLE FILTER BLUNT 18X1 1/2 (NEEDLE) IMPLANT
NEEDLE HYPO 25X1 1.5 SAFETY (NEEDLE) ×1 IMPLANT
PACK BASIN DAY SURGERY FS (CUSTOM PROCEDURE TRAY) ×1 IMPLANT
PENCIL SMOKE EVACUATOR (MISCELLANEOUS) ×1 IMPLANT
PIN SAFETY STERILE (MISCELLANEOUS) IMPLANT
SHEET MEDIUM DRAPE 40X70 STRL (DRAPES) ×1 IMPLANT
SIZER BREAST SGL USE 320-340CC (SIZER) ×1 IMPLANT
SIZER BREAST SGL USE 350-380CC (SIZER) ×1 IMPLANT
SIZER BRST SGL USE 320-340CC (SIZER) IMPLANT
SIZER BRST SGL USE 350-380CC (SIZER) IMPLANT
SLEEVE SCD COMPRESS KNEE MED (STOCKING) ×1 IMPLANT
SPIKE FLUID TRANSFER (MISCELLANEOUS) IMPLANT
SPONGE T-LAP 18X18 ~~LOC~~+RFID (SPONGE) ×1 IMPLANT
STAPLER SKIN PROX WIDE 3.9 (STAPLE) ×1 IMPLANT
SUT ETHILON 2 0 FS 18 (SUTURE) IMPLANT
SUT MNCRL AB 4-0 PS2 18 (SUTURE) IMPLANT
SUT PDS AB 2-0 CT2 27 (SUTURE) IMPLANT
SUT VIC AB 3-0 PS1 18XBRD (SUTURE) IMPLANT
SUT VIC AB 3-0 SH 27X BRD (SUTURE) IMPLANT
SUT VIC AB 4-0 PS2 18 (SUTURE) IMPLANT
SYR 20ML LL LF (SYRINGE) IMPLANT
SYR BULB IRRIG 60ML STRL (SYRINGE) ×2 IMPLANT
SYR CONTROL 10ML LL (SYRINGE) IMPLANT
TOWEL GREEN STERILE FF (TOWEL DISPOSABLE) ×2 IMPLANT
TUBE CONNECTING 20X1/4 (TUBING) ×1 IMPLANT
UNDERPAD 30X36 HEAVY ABSORB (UNDERPADS AND DIAPERS) ×2 IMPLANT
YANKAUER SUCT BULB TIP NO VENT (SUCTIONS) ×1 IMPLANT

## 2023-02-28 NOTE — Transfer of Care (Signed)
Immediate Anesthesia Transfer of Care Note  Patient: Rachel Singleton  Procedure(s) Performed: REMOVAL OF BILATERAL TISSUE EXPANDERS WITH PLACEMENT  SALINE BILATERAL BREAST IMPLANTS (Bilateral: Chest)  Patient Location: PACU  Anesthesia Type:General  Level of Consciousness: sedated  Airway & Oxygen Therapy: Patient Spontanous Breathing and Patient connected to face mask oxygen  Post-op Assessment: Report given to RN and Post -op Vital signs reviewed and stable  Post vital signs: Reviewed and stable  Last Vitals:  Vitals Value Taken Time  BP 101/62 02/28/23 0908  Temp    Pulse 69 02/28/23 0910  Resp 10 02/28/23 0910  SpO2 100 % 02/28/23 0910  Vitals shown include unfiled device data.  Last Pain:  Vitals:   02/28/23 0634  TempSrc: Tympanic  PainSc: 0-No pain      Patients Stated Pain Goal: 6 (02/28/23 7829)  Complications: No notable events documented.

## 2023-02-28 NOTE — Op Note (Signed)
Operative Note   DATE OF OPERATION: 12.13.2024  LOCATION: [facility]-outpatient  SURGICAL DIVISION: Plastic Surgery  PREOPERATIVE DIAGNOSES:  1. History breast cancer 2. Acquired absence breasts  POSTOPERATIVE DIAGNOSES:  same  PROCEDURE:  Removal bilateral chest tissue expanders and placement saline implants.  SURGEON: Glenna Fellows MD MBA  ASSISTANT: none  ANESTHESIA:  General.   EBL: 25 ml  COMPLICATIONS: None immediate.   INDICATIONS FOR PROCEDURE:  The patient, Graci, is a 42 y.o. female born on 26-Jul-1980, is here for staged breast reconstruction following nipple sparing mastectomies with immediate prepectoral expander based reconstruction.   FINDINGS: Complete incorporation ADM noted bilateral. Natrelle smooth round high profile 320 ml implants placed bilateral. REF [id number] fill volume 335 ml bilateral. RIGHT SN [id number] LEFT [id number]  DESCRIPTION OF PROCEDURE:  The patient's operative site was marked with the patient in the preoperative area to mark sternal notch, chest midline, anterior axillary lines. The patient was taken to the operating room. SCDs were placed and IV antibiotics were given. A time out was performed and all information was confirmed to be correct. The patient's chest was then prepped and draped in a sterile fashion.     Incision made in left chest inframammary fold mastectomy scar. Incision carried through to capsule and expander removed. Capsulotomy performed superiorly. Sizer was placed, and I then directed attention to right chest. Incision made in prior scar and expander removed.Capsulotomy performed superiorly. Sizer placed and patient brought to upright sitting position. A Natrelle Smooth round saline High Profile 320 ml implant selected. Patient returned to supine position.    Bilateral cavities irrigated with saline solution containing Ancef, gentamicin, an Betadine. Hemostasis ensured. Implant placed in each cavity and filled  to 335 ml. Fill ports removed and tab closure ensured. Closure was completed with 3-0 vicryl for approximation of capsule and superficial fascia. 4-0 vicryl was placed in dermis and running 4-0 monocryl was used to close skin. Tissue adhesive was applied to IMF incisions. Dry dressing and breast binder applied.    The patient was allowed to wake from anesthesia, extubated and taken to the recovery room in satisfactory condition.   SPECIMENS: none  DRAINS: none  Glenna Fellows, MD [facility] Plastic & Reconstructive Surgery  Office/ physician access line after hours [phone]

## 2023-02-28 NOTE — Anesthesia Procedure Notes (Signed)
Procedure Name: LMA Insertion Date/Time: 02/28/2023 7:27 AM  Performed by: Ronnette Hila, CRNAPre-anesthesia Checklist: Patient identified, Emergency Drugs available, Suction available and Patient being monitored Patient Re-evaluated:Patient Re-evaluated prior to induction Oxygen Delivery Method: Circle System Utilized Preoxygenation: Pre-oxygenation with 100% oxygen Induction Type: IV induction Ventilation: Mask ventilation without difficulty LMA: LMA inserted LMA Size: 3.0 Number of attempts: 1 Airway Equipment and Method: bite block Placement Confirmation: positive ETCO2 Tube secured with: Tape Dental Injury: Teeth and Oropharynx as per pre-operative assessment

## 2023-02-28 NOTE — Anesthesia Preprocedure Evaluation (Addendum)
Anesthesia Evaluation  Patient identified by MRN, date of birth, ID band Patient awake    Reviewed: Allergy & Precautions, H&P , NPO status , Patient's Chart, lab work & pertinent test results  Airway Mallampati: II  TM Distance: >3 FB Neck ROM: Full    Dental no notable dental hx. (+) Teeth Intact, Dental Advisory Given   Pulmonary asthma , former smoker   Pulmonary exam normal breath sounds clear to auscultation       Cardiovascular negative cardio ROS  Rhythm:Regular Rate:Normal     Neuro/Psych negative neurological ROS  negative psych ROS   GI/Hepatic negative GI ROS, Neg liver ROS,,,  Endo/Other  negative endocrine ROS    Renal/GU negative Renal ROS  negative genitourinary   Musculoskeletal   Abdominal   Peds  Hematology negative hematology ROS (+)   Anesthesia Other Findings   Reproductive/Obstetrics negative OB ROS                             Anesthesia Physical Anesthesia Plan  ASA: 2  Anesthesia Plan: General   Post-op Pain Management: Tylenol PO (pre-op)* and Celebrex PO (pre-op)*   Induction: Intravenous  PONV Risk Score and Plan: 4 or greater and Ondansetron, Dexamethasone, Propofol infusion, TIVA and Midazolam  Airway Management Planned: Oral ETT  Additional Equipment:   Intra-op Plan:   Post-operative Plan: Extubation in OR  Informed Consent: I have reviewed the patients History and Physical, chart, labs and discussed the procedure including the risks, benefits and alternatives for the proposed anesthesia with the patient or authorized representative who has indicated his/her understanding and acceptance.     Dental advisory given  Plan Discussed with: CRNA  Anesthesia Plan Comments:        Anesthesia Quick Evaluation

## 2023-02-28 NOTE — Anesthesia Postprocedure Evaluation (Signed)
Anesthesia Post Note  Patient: Rachel Singleton  Procedure(s) Performed: REMOVAL OF BILATERAL TISSUE EXPANDERS WITH PLACEMENT  SALINE BILATERAL BREAST IMPLANTS (Bilateral: Chest)     Patient location during evaluation: PACU Anesthesia Type: General Level of consciousness: awake and alert Pain management: pain level controlled Vital Signs Assessment: post-procedure vital signs reviewed and stable Respiratory status: spontaneous breathing, nonlabored ventilation and respiratory function stable Cardiovascular status: blood pressure returned to baseline and stable Postop Assessment: no apparent nausea or vomiting Anesthetic complications: no  No notable events documented.  Last Vitals:  Vitals:   02/28/23 1000 02/28/23 1009  BP: (!) 107/57 114/69  Pulse: 75 83  Resp: 10 16  Temp:  36.9 C  SpO2: 100% 100%    Last Pain:  Vitals:   02/28/23 1009  TempSrc:   PainSc: 0-No pain                 Benicio Manna,W. EDMOND

## 2023-02-28 NOTE — Discharge Instructions (Addendum)
You may have Tylenol again after 12:30pm today, if needed. You may have NSAIDS (Ibuprofen, Motrin, etc) after 2:30pm today, if needed.   Post Anesthesia Home Care Instructions  Activity: Get plenty of rest for the remainder of the day. A responsible individual must stay with you for 24 hours following the procedure.  For the next 24 hours, DO NOT: -Drive a car -Advertising copywriter -Drink alcoholic beverages -Take any medication unless instructed by your physician -Make any legal decisions or sign important papers.  Meals: Start with liquid foods such as gelatin or soup. Progress to regular foods as tolerated. Avoid greasy, spicy, heavy foods. If nausea and/or vomiting occur, drink only clear liquids until the nausea and/or vomiting subsides. Call your physician if vomiting continues.  Special Instructions/Symptoms: Your throat may feel dry or sore from the anesthesia or the breathing tube placed in your throat during surgery. If this causes discomfort, gargle with warm salt water. The discomfort should disappear within 24 hours.  If you had a scopolamine patch placed behind your ear for the management of post- operative nausea and/or vomiting:  1. The medication in the patch is effective for 72 hours, after which it should be removed.  Wrap patch in a tissue and discard in the trash. Wash hands thoroughly with soap and water. 2. You may remove the patch earlier than 72 hours if you experience unpleasant side effects which may include dry mouth, dizziness or visual disturbances. 3. Avoid touching the patch. Wash your hands with soap and water after contact with the patch.

## 2023-02-28 NOTE — Interval H&P Note (Signed)
History and Physical Interval Note:  02/28/2023 6:55 AM  Rachel Singleton  has presented today for surgery, with the diagnosis of Right breast ca UIQ Er positive  acquired absence breasts.  The various methods of treatment have been discussed with the patient and family. After consideration of risks, benefits and other options for treatment, the patient has consented to  Procedure(s): REMOVAL OF BILATERAL TISSUE EXPANDERS WITH PLACEMENT  SALINE BILATERAL BREAST IMPLANTS (Bilateral) as a surgical intervention.  The patient's history has been reviewed, patient examined, no change in status, stable for surgery.  I have reviewed the patient's chart and labs.  Questions were answered to the patient's satisfaction.     Irean Hong Anguel Delapena

## 2023-03-04 ENCOUNTER — Encounter (HOSPITAL_BASED_OUTPATIENT_CLINIC_OR_DEPARTMENT_OTHER): Payer: Self-pay | Admitting: Plastic Surgery

## 2023-03-06 ENCOUNTER — Encounter: Payer: Self-pay | Admitting: Pharmacist

## 2023-04-02 ENCOUNTER — Inpatient Hospital Stay: Payer: Self-pay | Attending: Hematology & Oncology | Admitting: Adult Health

## 2023-04-02 VITALS — BP 112/70 | HR 91 | Temp 98.5°F | Resp 18 | Wt 141.4 lb

## 2023-04-02 DIAGNOSIS — Z800 Family history of malignant neoplasm of digestive organs: Secondary | ICD-10-CM | POA: Insufficient documentation

## 2023-04-02 DIAGNOSIS — Z9221 Personal history of antineoplastic chemotherapy: Secondary | ICD-10-CM | POA: Insufficient documentation

## 2023-04-02 DIAGNOSIS — Z808 Family history of malignant neoplasm of other organs or systems: Secondary | ICD-10-CM | POA: Insufficient documentation

## 2023-04-02 DIAGNOSIS — C50211 Malignant neoplasm of upper-inner quadrant of right female breast: Secondary | ICD-10-CM | POA: Insufficient documentation

## 2023-04-02 DIAGNOSIS — Z87891 Personal history of nicotine dependence: Secondary | ICD-10-CM | POA: Insufficient documentation

## 2023-04-02 DIAGNOSIS — Z803 Family history of malignant neoplasm of breast: Secondary | ICD-10-CM | POA: Insufficient documentation

## 2023-04-02 DIAGNOSIS — Z79811 Long term (current) use of aromatase inhibitors: Secondary | ICD-10-CM | POA: Insufficient documentation

## 2023-04-02 DIAGNOSIS — Z9013 Acquired absence of bilateral breasts and nipples: Secondary | ICD-10-CM | POA: Insufficient documentation

## 2023-04-02 DIAGNOSIS — Z170 Estrogen receptor positive status [ER+]: Secondary | ICD-10-CM | POA: Insufficient documentation

## 2023-04-02 NOTE — Patient Instructions (Signed)
 Ribociclib  Tablets What is this medication? RIBOCICLIB  (RYE boe SYE klib) treats breast cancer. It works by blocking a protein that causes cancer cells to grow and multiply. This helps to slow or stop the spread of cancer cells. This medicine may be used for other purposes; ask your health care provider or pharmacist if you have questions. COMMON BRAND NAME(S): KISQALI What should I tell my care team before I take this medication? They need to know if you have any of these conditions: Heart disease High or low levels of calcium, magnesium, potassium, or phosphorus in the blood Irregular heartbeat or rhythm Liver disease Low white blood cell levels Lung or breathing disease, such as asthma or COPD An unusual or allergic reaction to ribociclib , other medications, foods, dyes, or preservatives If you or your partner are pregnant or trying to get pregnant Breast-feeding How should I use this medication? Take this medication by mouth with water . Take it as directed on the prescription label at the same time every day. Do not cut, crush, or chew this medication. Swallow the tablets whole. You can take it with or without food. If it upsets your stomach, take it with food. Keep taking it unless your care team tells you to stop. Do not take this medication with grapefruit juice. This medication is taken in "cycles." There will be days you do not take it. Talk to your care team if you have questions about when to take your medication. It is very important to follow the exact schedule. Taking it more often than directed can cause serious side effects. Talk to your care team about the use of this medication in children. Special care may be needed. Overdosage: If you think you have taken too much of this medicine contact a poison control center or emergency room at once. NOTE: This medicine is only for you. Do not share this medicine with others. What if I miss a dose? If you miss a dose, skip it. Take  your next dose at the normal time. Do not take extra or 2 doses at the same time to make up for the missed dose. What may interact with this medication? Do not take this medication with any of the following: Alfuzosin Certain antivirals for HIV or hepatitis Certain medications for anxiety, such as alprazolam, triazolam Certain medications for cholesterol, such as lomitapide, lovastatin, simvastatin Certain medications for fungal infections, such as fluconazole , isavuconazonium, ketoconazole, posaconazole Certain medications for migraine headache, such as eletriptan, ubrogepant Cisapride Conivaptan Dronedarone Ergot alkaloids, such as dihydroergotamine, ergotamine Finerenone Flibanserin Ivabradine Levoketoconazole Lonafarnib Lurasidone Mavacamten Pacritinib Pimozide Ranolazine Silodosin Thioridazine Tolvaptan Voclosporin This medication may also interact with the following: Alfentanil Certain other antivirals for HIV or hepatitis Certain other medications for fungal infections, such as itraconazole or voriconazole Certain medications for seizures, such as carbamazepine, phenobarbital, phenytoin Chloroquine Clarithromycin Everolimus Fentanyl  Grapefruit juice Haloperidol Medications that lower your chance of fighting infection, such as cyclosporine, sirolimus, tacrolimus Methadone Midazolam  Moxifloxacin Nefazodone Ondansetron  Other medications that cause heart rhythm changes Rifampin St. John's wort Tamoxifen This list may not describe all possible interactions. Give your health care provider a list of all the medicines, herbs, non-prescription drugs, or dietary supplements you use. Also tell them if you smoke, drink alcohol, or use illegal drugs. Some items may interact with your medicine. What should I watch for while using this medication? Your condition will be monitored carefully while you are receiving this medication. You may need blood work while taking this  medication. This medication  may increase your risk of getting an infection. Call your care team for advice if you get a fever, chills, sore throat, or other symptoms of a cold or flu. Do not treat yourself. Try to avoid being around people who are sick. Avoid taking medications that contain aspirin, acetaminophen , ibuprofen , naproxen, or ketoprofen unless instructed by your care team. These medications may hide a fever. This medication may increase your risk to bruise or bleed. Call your care team if you notice any unusual bleeding. Be careful brushing or flossing your teeth or using a toothpick because you may get an infection or bleed more easily. If you have any dental work done, tell your dentist you are receiving this medication. This medication may cause serious skin reactions. They can happen weeks to months after starting the medication. Contact your care team right away if you notice fevers or flu-like symptoms with a rash. The rash may be purple or red and then turn into blisters or peeling of the skin. You may also notice a red rash with swelling of the face, lips, or lymph nodes in your neck or under your arms. Talk to your care team if you may be pregnant. Serious birth defects can occur if you take this medication during pregnancy and for 3 weeks after the last dose. You will need a negative pregnancy test before starting this medication. Contraception is recommended while taking this medication and for 3 weeks after the last dose. Your care team can help you find the option that works for you. Do not breastfeed while taking this medication and for 3 weeks after the last dose. This medication may cause infertility. Talk to your care team if you are concerned about your fertility. What side effects may I notice from receiving this medication? Side effects that you should report to your care team as soon as possible: Allergic reactions--skin rash, itching, hives, swelling of the face, lips,  tongue, or throat Dry cough, shortness of breath or trouble breathing Heart rhythm changes--fast or irregular heartbeat, dizziness, feeling faint or lightheaded, chest pain, trouble breathing Infection--fever, chills, cough, or sore throat Liver injury--right upper belly pain, loss of appetite, nausea, light-colored stool, dark yellow or brown urine, yellowing skin or eyes, unusual weakness or fatigue Rash, fever, and swollen lymph nodes Redness, blistering, peeling, or loosening of the skin, including inside the mouth Side effects that usually do not require medical attention (report these to your care team if they continue or are bothersome): Diarrhea Fatigue Hair loss Joint pain Nausea This list may not describe all possible side effects. Call your doctor for medical advice about side effects. You may report side effects to FDA at 1-800-FDA-1088. Where should I keep my medication? Keep out of the reach of children and pets. Store between 20 and 25 degrees C (68 and 77 degrees F). Keep this medication in the original packaging until you are ready to take it. Get rid of any unused medication after the expiration date. To get rid of medications that are no longer needed or have expired: Take the medication to a medication take-back program. Check with your pharmacy or law enforcement to find a location. If you cannot return the medication, ask your pharmacist or care team how to get rid of this medication safely. NOTE: This sheet is a summary. It may not cover all possible information. If you have questions about this medicine, talk to your doctor, pharmacist, or health care provider.  2024 Elsevier/Gold Standard (2022-01-10 00:00:00)

## 2023-04-02 NOTE — Progress Notes (Signed)
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 Report date is 09/17/2022.  The CancerNext-Expanded gene panel offered by [facility] and includes sequencing, rearrangement, and RNA analysis for the following 71 genes: AIP, ALK, APC, ATM, AXIN2, BAP1, BARD1, BMPR1A, BRCA1, BRCA2, BRIP1, CDC73, CDH1, CDK4, CDKN1B, CDKN2A, CHEK2, CTNNA1, DICER1, FH, FLCN, KIF1B, LZTR1, MAX, MEN1, MET, MLH1, MSH2, MSH3, MSH6, MUTYH, NF1, NF2, NTHL1, PALB2, PHOX2B, PMS2, POT1, PRKAR1A, PTCH1, PTEN, RAD51C, RAD51D, RB1, RET, SDHA, SDHAF2, SDHB, SDHC, SDHD, SMAD4, SMARCA4, SMARCB1, SMARCE1, STK11, SUFU, TMEM127, TP53, TSC1, TSC2, and VHL (sequencing and deletion/duplication); EGFR, EGLN1, HOXB13, KIT, MITF, PDGFRA, POLD1, and POLE (sequencing only); EPCAM and GREM1 (deletion/duplication only).    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 Oncotype DX score 25   11/08/2022 -  Chemotherapy   Patient is on Treatment Plan : BREAST TC q21d     01/2023 -  Anti-estrogen oral therapy   Anastrozole x 7 years     INTERVAL HISTORY:  Ms. Estepp to review  her survivorship care plan detailing her treatment course for breast cancer, as well as monitoring long-term side effects of that treatment, education regarding health maintenance, screening, and overall wellness and health promotion.     Overall, Ms. Scheibel reports feeling quite well.  She is taking Anastrozole daily.  She tolerates this moderately well.    REVIEW OF SYSTEMS:  Review of Systems  Constitutional:  Positive for fatigue. Negative for appetite change, chills, fever and unexpected weight change.  HENT:   Negative for hearing loss, lump/mass and trouble swallowing.   Eyes:  Negative for eye problems and icterus.  Respiratory:  Negative for chest tightness, cough and shortness of breath.   Cardiovascular:  Negative for chest pain, leg swelling and palpitations.  Gastrointestinal:  Negative for abdominal distention, abdominal pain, constipation, diarrhea, nausea and vomiting.  Endocrine: Negative for hot flashes.  Genitourinary:  Negative for difficulty urinating.   Musculoskeletal:  Negative for arthralgias.  Skin:  Negative for itching and rash.  Neurological:  Negative for dizziness, extremity weakness, headaches and numbness.  Hematological:  Negative for adenopathy. Does not bruise/bleed easily.  Psychiatric/Behavioral:  Negative for depression. The patient is not nervous/anxious.   Breast: Denies any new nodularity, masses, tenderness, nipple changes, or nipple discharge.       PAST MEDICAL/SURGICAL HISTORY:  Past Medical History:  Diagnosis Date   Asthma    pregnancy only   Breast cancer (HCC) 08/21/2022   Former smoker 03/18/2006   Quit 2008   Hx of varicella    Pneumonia    few months ago   Past Surgical History:  Procedure Laterality Date   BREAST BIOPSY Right 08/21/2022   MM RT BREAST BX W LOC DEV  1ST LESION IMAGE BX SPEC STEREO GUIDE 08/21/2022 [facility]   BREAST BIOPSY Right 08/21/2022   MM RT BREAST BX W LOC DEV EA AD LESION IMG BX SPEC STEREO  GUIDE 08/21/2022 [facility]   BREAST BIOPSY Right 08/21/2022   [place] RT BREAST BX W LOC DEV 1ST LESION IMG BX SPEC US GUIDE 08/21/2022 [facility]   BREAST RECONSTRUCTION WITH PLACEMENT OF TISSUE EXPANDER AND ALLODERM Bilateral 09/17/2022   Procedure: BREAST RECONSTRUCTION WITH PLACEMENT OF TISSUE EXPANDER AND ALLODERM;  Surgeon: Glenna Fellows, MD;  Location: [facility];  Service: Plastics;  Laterality: Bilateral;   MASTECTOMY W/ SENTINEL NODE BIOPSY Right 09/17/2022   Procedure: RIGHT MASTECTOMY WITH SENTINEL LYMPH NODE BIOPSY;  Surgeon: Manus Rudd, MD;  Location: [facility];  Service: General;  Laterality: Right;   PORT-A-CATH REMOVAL N/A 02/06/2023   Procedure: REMOVAL PORT-A-CATH;  Surgeon: Manus Rudd, MD;  Location: [facility];  Service: General;  Laterality: N/A;   PORTACATH PLACEMENT N/A 11/07/2022   Procedure: ULTRASOUND-GUIDED PORT PLACEMENT;  Surgeon: Manus Rudd, MD;  Location: [facility];  Service: General;  Laterality: N/A;  LMA   REMOVAL OF BILATERAL TISSUE EXPANDERS WITH PLACEMENT OF BILATERAL BREAST IMPLANTS Bilateral 02/28/2023   Procedure: REMOVAL OF BILATERAL TISSUE EXPANDERS WITH PLACEMENT  SALINE BILATERAL BREAST IMPLANTS;  Surgeon: Glenna Fellows, MD;  Location: [facility];  Service: Plastics;  Laterality: Bilateral;  fill volume bilaterally   ROBOTIC ASSISTED BILATERAL SALPINGO OOPHERECTOMY Bilateral 02/11/2023   Procedure: XI ROBOTIC ASSISTED BILATERAL SALPINGO OOPHORECTOMY;  Surgeon: Clide Cliff, MD;  Location: [facility];  Service: Gynecology;  Laterality: Bilateral;   SIMPLE MASTECTOMY WITH AXILLARY SENTINEL NODE BIOPSY Left 09/17/2022   Procedure: LEFT SIMPLE MASTECTOMY;  Surgeon: Manus Rudd, MD;  Location: [facility];  Service: General;  Laterality: Left;   WISDOM TOOTH EXTRACTION       ALLERGIES:  Allergies  Allergen Reactions   Aluminum-Containing Compounds Hives  and Rash     CURRENT MEDICATIONS:  Outpatient Encounter Medications as of 04/02/2023  Medication Sig Note   anastrozole (ARIMIDEX) 1 MG tablet Take 1 tablet (1 mg total) by mouth daily. 02/03/2023: Have not started   [DISCONTINUED] oxyCODONE (OXY IR/ROXICODONE) 5 MG immediate release tablet Take 1 tablet (5 mg total) by mouth every 4 (four) hours as needed for severe pain (pain score 7-10). For AFTER surgery only, do not take and drive    [DISCONTINUED] sulfamethoxazole-trimethoprim (BACTRIM DS) 800-160 MG tablet Take 1 tablet by mouth 2 (two) times daily.    No facility-administered encounter medications on file as of 04/02/2023.     ONCOLOGIC FAMILY HISTORY:  Family History  Problem Relation Age of Onset   Breast cancer Mother 30       DCIS (-/-)   Heart disease Father    Heart attack Father    Cystic fibrosis Brother    Colon cancer Maternal Grandfather 20 - 75   Skin cancer Maternal Aunt 60 - 69   Breast cancer Paternal Aunt 39 - 69   Breast cancer Other 50 - 59   Ovarian cancer Neg Hx    Endometrial cancer Neg Hx      SOCIAL HISTORY:  Social History   Socioeconomic History   Marital status: Married    Spouse name: Not on file   Number of children: Not on file   Years of education: Not on file   Highest education level: Not on file  Occupational History  Not on file  Tobacco Use   Smoking status: Former    Types: Cigarettes    Passive exposure: Past   Smokeless tobacco: Never  Vaping Use   Vaping status: Never Used  Substance and Sexual Activity   Alcohol use: No   Drug use: No   Sexual activity: Yes    Birth control/protection: None  Other Topics Concern   Not on file  Social History Narrative   ** Merged History Encounter **       Social Drivers of Health   Financial Resource Strain: Not on file  Food Insecurity: No Food Insecurity (09/04/2022)   Hunger Vital Sign    Worried About Running Out of Food in the Last Year: Never true    Ran Out of  Food in the Last Year: Never true  Transportation Needs: No Transportation Needs (09/04/2022)   PRAPARE - Administrator, Civil Service (Medical): No    Lack of Transportation (Non-Medical): No  Physical Activity: Not on file  Stress: Not on file  Social Connections: Unknown (07/31/2021)   [facility]   Social Network    Social Network: Not on file  Intimate Partner Violence: Not At Risk (09/04/2022)   Humiliation, Afraid, Rape, and Kick questionnaire    Fear of Current or Ex-Partner: No    Emotionally Abused: No    Physically Abused: No    Sexually Abused: No     OBSERVATIONS/OBJECTIVE:  BP 112/70 (BP Location: Left Arm, Patient Position: Sitting)   Pulse 91   Temp 98.5 F (36.9 C) (Temporal)   Resp 18   Wt 141 lb 6.4 oz (64.1 kg)   SpO2 99%   BMI 22.15 kg/m  GENERAL: Patient is a well appearing female in no acute distress HEENT:  Sclerae anicteric.  Oropharynx clear and moist. No ulcerations or evidence of oropharyngeal candidiasis. Neck is supple.  NODES:  No cervical, supraclavicular, or axillary lymphadenopathy palpated.  BREAST EXAM: s/p bilateral mastectomies with reconstruction, no sign of local recurrence.  LUNGS:  Clear to auscultation bilaterally.  No wheezes or rhonchi. HEART:  Regular rate and rhythm. No murmur appreciated. ABDOMEN:  Soft, nontender.  Positive, normoactive bowel sounds. No organomegaly palpated. MSK:  No focal spinal tenderness to palpation. Full range of motion bilaterally in the upper extremities. EXTREMITIES:  No peripheral edema.   SKIN:  Clear with no obvious rashes or skin changes. No nail dyscrasia. NEURO:  Nonfocal. Well oriented.  Appropriate affect.   LABORATORY DATA:  None for this visit.  DIAGNOSTIC IMAGING:  None for this visit.      ASSESSMENT AND PLAN:  Ms.. Rachel Singleton is a pleasant 43 y.o. female with Stage IIA right breast invasive ductal carcinoma, ER+/PR+/HER2-, diagnosed in  08/2022, treated with bilateral mastectomies, adjuvant chemotherapy, and anti-estrogen therapy with Anastrozole  beginning in 01/2023. She presents to the Survivorship Clinic for our initial meeting and routine follow-up post-completion of treatment for breast cancer.    1. Stage IIA right breast cancer:  Ms. Mauss is continuing to recover from definitive treatment for breast cancer.  Gust the recommendation for Ribociclib and she agreeable to meet with Marylynn Pearson, CPP in a couple weeks to start the medication.  She will continue her anti-estrogen therapy with anastrozole.  She is tolerating this well.  She has undergone bilateral salpingo-oophorectomies. Today, a comprehensive survivorship care plan and treatment summary was reviewed with the patient today detailing her breast cancer diagnosis, treatment course, potential late/long-term  effects of treatment, appropriate follow-up care with recommendations for the future, and patient education resources.  A copy of this summary, along with a letter will be sent to the patient's primary care provider via mail/fax/In Basket message after today's visit.    2. Bone health:  Given Ms. Zavaleta's age/history of breast cancer and her current treatment regimen including anti-estrogen therapy with Anastrozole, she is at risk for bone demineralization.  Orders placed for this to be completed in the next few months. She was given education on specific activities to promote bone health.  3. Cancer screening:  Due to Ms. Musto's history and her age, she should receive screening for skin cancers, colon cancer, and gynecologic cancers.  The information and recommendations are listed on the patient's comprehensive care plan/treatment summary and were reviewed in detail with the patient.    4. Health maintenance and wellness promotion: Ms. Senko was encouraged to consume 5-7 servings of fruits and vegetables per day. We reviewed the ACL[facility] pillars of health handout  briefly.  She was also encouraged to engage in moderate to vigorous exercise for 30 minutes per day most days of the week.  She was instructed to limit her alcohol consumption and continue to abstain from tobacco use.     5. Support services/counseling: It is not uncommon for this period of the patient's cancer care trajectory to be one of many emotions and stressors.   She was given information regarding our available services and encouraged to contact me with any questions or for help enrolling in any of our support group/programs.    Follow up instructions:    -Return to cancer center in 2 weeks for follow-up and discussion to start Ribociclib -Bone density testing in the next few months -She is welcome to return back to the Survivorship Clinic at any time; no additional follow-up needed at this time.  -Consider referral back to survivorship as a long-term survivor for continued surveillance  The patient was provided an opportunity to ask questions and all were answered. The patient agreed with the plan and demonstrated an understanding of the instructions.   Total encounter time:30 minutes*in face-to-face visit time, chart review, lab review, care coordination, order entry, and documentation of the encounter time.    Lillard Anes, NP 04/02/23 10:45 AM Medical Oncology and Hematology [facility] [address] [place], [place] [address] Tel. [phone]    Fax. [phone]  *Total Encounter Time as defined by the Centers for Medicare and Medicaid Services includes, in addition to the face-to-face time of a patient visit (documented in the note above) non-face-to-face time: obtaining and reviewing outside history, ordering and reviewing medications, tests or procedures, care coordination (communications with other health care professionals or caregivers) and documentation in the medical record.

## 2023-04-03 ENCOUNTER — Other Ambulatory Visit: Payer: Self-pay

## 2023-04-04 ENCOUNTER — Encounter: Payer: Self-pay | Admitting: Adult Health

## 2023-04-04 ENCOUNTER — Encounter: Payer: Self-pay | Admitting: Hematology and Oncology

## 2023-04-12 ENCOUNTER — Other Ambulatory Visit: Payer: Self-pay

## 2023-04-15 ENCOUNTER — Other Ambulatory Visit (HOSPITAL_COMMUNITY): Payer: Self-pay

## 2023-04-15 ENCOUNTER — Other Ambulatory Visit: Payer: Self-pay

## 2023-04-15 ENCOUNTER — Other Ambulatory Visit: Payer: Self-pay | Admitting: Pharmacist

## 2023-04-15 DIAGNOSIS — C50211 Malignant neoplasm of upper-inner quadrant of right female breast: Secondary | ICD-10-CM

## 2023-04-16 ENCOUNTER — Inpatient Hospital Stay: Payer: Self-pay | Admitting: Pharmacist

## 2023-04-16 ENCOUNTER — Other Ambulatory Visit (HOSPITAL_COMMUNITY): Payer: Self-pay

## 2023-04-16 ENCOUNTER — Inpatient Hospital Stay (HOSPITAL_BASED_OUTPATIENT_CLINIC_OR_DEPARTMENT_OTHER): Payer: Self-pay | Admitting: Hematology and Oncology

## 2023-04-16 ENCOUNTER — Inpatient Hospital Stay: Payer: Self-pay

## 2023-04-16 ENCOUNTER — Telehealth: Payer: Self-pay | Admitting: Pharmacy Technician

## 2023-04-16 VITALS — BP 117/57 | HR 84 | Temp 97.7°F | Resp 18 | Ht 67.0 in | Wt 139.6 lb

## 2023-04-16 DIAGNOSIS — C50211 Malignant neoplasm of upper-inner quadrant of right female breast: Secondary | ICD-10-CM

## 2023-04-16 LAB — CBC WITH DIFFERENTIAL (CANCER CENTER ONLY)
Abs Immature Granulocytes: 0.02 10*3/uL (ref 0.00–0.07)
Basophils Absolute: 0.1 10*3/uL (ref 0.0–0.1)
Basophils Relative: 1 %
Eosinophils Absolute: 0.2 10*3/uL (ref 0.0–0.5)
Eosinophils Relative: 3 %
HCT: 41.5 % (ref 36.0–46.0)
Hemoglobin: 13.8 g/dL (ref 12.0–15.0)
Immature Granulocytes: 0 %
Lymphocytes Relative: 18 %
Lymphs Abs: 1.1 10*3/uL (ref 0.7–4.0)
MCH: 28.1 pg (ref 26.0–34.0)
MCHC: 33.3 g/dL (ref 30.0–36.0)
MCV: 84.5 fL (ref 80.0–100.0)
Monocytes Absolute: 0.5 10*3/uL (ref 0.1–1.0)
Monocytes Relative: 7 %
Neutro Abs: 4.4 10*3/uL (ref 1.7–7.7)
Neutrophils Relative %: 71 %
Platelet Count: 215 10*3/uL (ref 150–400)
RBC: 4.91 MIL/uL (ref 3.87–5.11)
RDW: 13 % (ref 11.5–15.5)
WBC Count: 6.2 10*3/uL (ref 4.0–10.5)
nRBC: 0 % (ref 0.0–0.2)

## 2023-04-16 LAB — CMP (CANCER CENTER ONLY)
ALT: 18 U/L (ref 0–44)
AST: 17 U/L (ref 15–41)
Albumin: 4.4 g/dL (ref 3.5–5.0)
Alkaline Phosphatase: 39 U/L (ref 38–126)
Anion gap: 6 (ref 5–15)
BUN: 19 mg/dL (ref 6–20)
CO2: 31 mmol/L (ref 22–32)
Calcium: 9.5 mg/dL (ref 8.9–10.3)
Chloride: 104 mmol/L (ref 98–111)
Creatinine: 0.87 mg/dL (ref 0.44–1.00)
GFR, Estimated: >60 mL/min (ref >60)
Glucose, Bld: 82 mg/dL (ref 70–99)
Potassium: 4.4 mmol/L (ref 3.5–5.1)
Sodium: 141 mmol/L (ref 135–145)
Total Bilirubin: 0.6 mg/dL (ref 0.0–1.2)
Total Protein: 7 g/dL (ref 6.5–8.1)

## 2023-04-16 LAB — MAGNESIUM: Magnesium: 1.8 mg/dL (ref 1.7–2.4)

## 2023-04-16 LAB — PHOSPHORUS: Phosphorus: 5.4 mg/dL — ABNORMAL HIGH (ref 2.5–4.6)

## 2023-04-16 MED ORDER — RIBOCICLIB SUCC (400 MG DOSE) 200 MG PO TBPK: 400.0000 mg | ORAL_TABLET | Freq: Every day | ORAL | 3 refills | Status: DC

## 2023-04-16 NOTE — Assessment & Plan Note (Signed)
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 Oncotype DX score 25 11/21/2022: CT CAP: No metastatic disease.  Suspected acute PE left lower lobe 11/21/2022: CT angiogram: No PE, pneumonia 11/27/2022: Bone scan: No metastatic disease.   Treatment plan: Adjuvant chemotherapy with Taxotere and Cytoxan every 3 weeks x 4 completed 01/10/2023  Adjuvant antiestrogen therapy with anastrozole (oophorectomy planned for 02/11/2023) ------------------------------------------------------------------------------------------------------------------------------------ Positive Signatera test:  CT CAP 11/21/2022: Suspected acute PE (CT angiogram chest: No PE).  No metastatic disease Bone scan 11/27/2022: Benign 02/11/2023: Laparoscopic BSO 02/28/2023: Bilateral breast implant placement  Anastrozole toxicities:  Recommended starting Ribociclib based on NATALEE clinical trial

## 2023-04-16 NOTE — Progress Notes (Signed)
Patient Care Team: Deatra James, MD as PCP - General (Family Medicine) Serena Croissant, MD as Consulting Physician (Hematology and Oncology) Manus Rudd, MD as Consulting Physician (General Surgery) Dorothy Puffer, MD as Consulting Physician (Radiation Oncology)  DIAGNOSIS:  Encounter Diagnosis  Name Primary?   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 Report date is 09/17/2022.  The CancerNext-Expanded gene panel offered by [facility] and includes sequencing, rearrangement, and RNA analysis for the following 71 genes: AIP, ALK, APC, ATM, AXIN2, BAP1, BARD1, BMPR1A, BRCA1, BRCA2, BRIP1, CDC73, CDH1, CDK4, CDKN1B, CDKN2A, CHEK2, CTNNA1, DICER1, FH, FLCN, KIF1B, LZTR1, MAX, MEN1, MET, MLH1, MSH2, MSH3, MSH6, MUTYH, NF1, NF2, NTHL1, PALB2, PHOX2B, PMS2, POT1, PRKAR1A, PTCH1, PTEN, RAD51C, RAD51D, RB1, RET, SDHA, SDHAF2, SDHB, SDHC, SDHD, SMAD4, SMARCA4, SMARCB1, SMARCE1, STK11, SUFU, TMEM127, TP53, TSC1, TSC2, and VHL (sequencing and deletion/duplication); EGFR, EGLN1, HOXB13, KIT, MITF, PDGFRA, POLD1, and POLE (sequencing only); EPCAM and GREM1 (deletion/duplication only).    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 Oncotype DX score 25   11/08/2022 -  Chemotherapy   Patient is on Treatment Plan : BREAST TC q21d     01/2023 -  Anti-estrogen oral therapy   Anastrozole x 7 years   04/02/2023 Cancer Staging   Staging form: Breast, AJCC 8th Edition - Pathologic: Stage IB (pT2, pN0, cM0, G3, ER+, PR+, HER2-, Oncotype DX score: 25) - Signed by Loa Socks, NP on 04/02/2023 Multigene prognostic tests performed: Oncotype DX Recurrence score range: Greater than or equal to 11 Histologic grading system: 3 grade system     CHIEF COMPLIANT: Follow-up to discuss adjuvant therapy  HISTORY OF PRESENT ILLNESS:  History of Present Illness   The patient, with hormone-positive breast cancer, presents for a discussion about ribociclib treatment.  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 She has previously undergone chemotherapy and hormone therapy, experiencing very few side effects from chemotherapy and none from anastrozole.  She is considering ribociclib, a CDK4/6 inhibitor, as part of her treatment regimen for hormone-positive breast cancer.  She inquired about the cost of the medication and was informed that there is usually a grant or assistance available to cover the cost, so patients typically do not pay out of pocket.  She mentioned that her Signatera test, which was negative, was not showing in the system, and she expressed relief about the negative result.         ALLERGIES:  is allergic to aluminum-containing compounds.  MEDICATIONS:  Current Outpatient Medications  Medication Sig Dispense Refill   ribociclib succ (KISQALI 400MG   DAILY DOSE) 200 MG Therapy Pack Take 2 tablets (400 mg total) by mouth daily. Take for 21  days on, 7 days off, repeat every 28 days. 42 tablet 3   anastrozole (ARIMIDEX) 1 MG tablet Take 1 tablet (1 mg total) by mouth daily. 90 tablet 3   No current facility-administered medications for this visit.    PHYSICAL EXAMINATION: ECOG PERFORMANCE STATUS: 1 - Symptomatic but completely ambulatory  Vitals:   04/16/23 1049  BP: (!) 117/57  Pulse: 84  Resp: 18  Temp: 97.7 F (36.5 C)  SpO2: 98%   Filed Weights   04/16/23 1049  Weight: 139 lb 9.6 oz (63.3 kg)    Physical Exam          (exam performed in the presence of a chaperone)  LABORATORY DATA:  I have reviewed the data as listed    Latest Ref Rng & Units 04/16/2023   10:23 AM 02/07/2023    1:24 PM 01/10/2023   10:23 AM  CMP  Glucose 70 - 99 mg/dL 82  86  098   BUN 6 - 20 mg/dL 19  17  17    Creatinine 0.44 - 1.00 mg/dL 1.19  1.47  8.29   Sodium 135 - 145 mmol/L 141  135  142   Potassium 3.5 - 5.1 mmol/L 4.4  3.8  4.2   Chloride 98 - 111 mmol/L 104  102  106   CO2 22 - 32 mmol/L 31  26  30    Calcium 8.9 - 10.3 mg/dL 9.5  8.7  9.7   Total Protein 6.5 - 8.1 g/dL 7.0  6.7  7.0   Total Bilirubin 0.0 - 1.2 mg/dL 0.6  0.8  0.5   Alkaline Phos 38 - 126 U/L 39  42  55   AST 15 - 41 U/L 17  18  22    ALT 0 - 44 U/L 18  19  38     Lab Results  Component Value Date   WBC 6.2 04/16/2023   HGB 13.8 04/16/2023   HCT 41.5 04/16/2023   MCV 84.5 04/16/2023   PLT 215 04/16/2023   NEUTROABS 4.4 04/16/2023    ASSESSMENT & PLAN:  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 Oncotype DX score 25 11/21/2022: CT CAP: No metastatic disease.  Suspected acute PE left lower lobe 11/21/2022: CT angiogram: No PE, pneumonia 11/27/2022: Bone scan: No metastatic  disease.   Treatment plan: Adjuvant chemotherapy with Taxotere and Cytoxan every 3 weeks x 4 completed 01/10/2023  Adjuvant antiestrogen therapy with anastrozole (oophorectomy planned for 02/11/2023) ------------------------------------------------------------------------------------------------------------------------------------ Positive Signatera test:  CT CAP 11/21/2022: Suspected acute PE (CT angiogram chest: No PE).  No metastatic disease Bone scan 11/27/2022: Benign 02/11/2023: Laparoscopic BSO 02/28/2023: Bilateral breast implant placement  Anastrozole toxicities: Tolerating it extremely well.  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 Discussed adding ribociclib to anastrozole based on the Nelson study, showing a 3.4% improvement in distant disease-free survival (90.4% vs. 87%) over three years. Explained ribociclib as a CDK4/6 inhibitor preventing cancer cell proliferation. Discussed major side effects: QT interval  prolongation, blood count changes, liver function impact. Emphasized monitoring EKG and blood counts initially. Patient willing to proceed. Discussed cost coverage through grants and medication procurement process. - Prescribe ribociclib, 2 tablets daily, three weeks on, one week off for three years. - Monitor EKG initially for QT interval prolongation. - Monitor blood counts every two weeks for the first month, then periodically. - Monitor liver function tests periodically. - Provide patient education on ribociclib, including potential side effects and drug interactions. - Schedule follow-up for EKG, blood work, and further education.     Orders Placed This Encounter   Procedures   CBC with Differential (Cancer Center Only)    Standing Status:   Future    Expiration Date:   04/15/2024   CMP (Cancer Center only)    Standing Status:   Future    Expiration Date:   04/15/2024   The patient has a good understanding of the overall plan. she agrees with it. she will call with any problems that may develop before the next visit here. Total time spent: 30 mins including face to face time and time spent for planning, charting and co-ordination of care   Tamsen Meek, MD 04/16/23

## 2023-04-16 NOTE — Telephone Encounter (Signed)
Oral Oncology Patient Advocate Encounter   Began application for assistance for Kisqali through [employer] Patient Support.  Reached out and spoke with patient regarding PAP paperwork, explained that I would send it to their preferred email via DocuSign.   Confirmed email address: [email or web address].    Patient expressed understanding and consent.  Will follow up once paperwork has been signed and returned.  [employer] phone number [phone].    Jinger Neighbors, CPhT-Adv Oncology Pharmacy Patient Advocate [facility] Direct Number: [phone]  Fax: [phone]

## 2023-04-17 NOTE — Telephone Encounter (Signed)
Oral Oncology Patient Advocate Encounter  Patient signatures received. Application now pending POI and MD signatures.  Jinger Neighbors, CPhT-Adv Oncology Pharmacy Patient Advocate [facility] Direct Number: [phone]  Fax: [phone]

## 2023-04-18 ENCOUNTER — Encounter: Payer: Self-pay | Admitting: Hematology and Oncology

## 2023-04-21 ENCOUNTER — Encounter: Payer: Self-pay | Admitting: Hematology & Oncology

## 2023-04-21 ENCOUNTER — Telehealth: Payer: Self-pay

## 2023-04-21 NOTE — Telephone Encounter (Signed)
S/P Robotic assisted laparoscopic bilateral salpingo-oophorectomy on 02/11/23 with Dr.Newton  I reached out to Rachel Singleton regarding the MyChart message she sent over the weekend. (See message)  Pt voices a concern regarding 2-3 days ago she noticed clear discharge, then over the weekend discharge is now yellow, no odor. Reports having vaginal itching on inside only, no redness/pain on outside of vagina. She states nothing has changed as far as no new soaps. No douches/gels/or creams have been used inside. She has not been on any recent antibiotics.   She is also asking if this is normal after surgery. She states this is like her 2-3 one since her surgery. Prior to her surgery she had never had a yeast infection.

## 2023-04-21 NOTE — Telephone Encounter (Signed)
Oral Oncology Patient Advocate Encounter   Submitted application for assistance for Kisqali to [employer].   Application submitted via e-fax to [phone]   [employer] Patient Support phone number [phone].   I will continue to check the status until final determination.   Jinger Neighbors, CPhT-Adv Oncology Pharmacy Patient Advocate [facility] Direct Number: [phone]  Fax: [phone]

## 2023-04-21 NOTE — Telephone Encounter (Signed)
Oral Oncology Patient Advocate Encounter  Received fax from [employer], patient has been referred to [employer] for free drug assistance. Once [employer] has reviewed her case there will be a final determination of eligibility.  [employer] phone [phone]  [employer] fax [phone]  POI sent to [employer] via e-fax.  Jinger Neighbors, CPhT-Adv Oncology Pharmacy Patient Advocate [facility] Direct Number: [phone]  Fax: [phone]

## 2023-04-23 ENCOUNTER — Other Ambulatory Visit: Payer: Self-pay

## 2023-04-23 ENCOUNTER — Inpatient Hospital Stay: Payer: Self-pay

## 2023-04-23 ENCOUNTER — Inpatient Hospital Stay: Payer: Self-pay | Attending: Hematology & Oncology | Admitting: Pharmacist

## 2023-04-23 VITALS — BP 124/68 | HR 77 | Temp 98.5°F | Resp 18 | Ht 67.0 in | Wt 142.2 lb

## 2023-04-23 DIAGNOSIS — Z9221 Personal history of antineoplastic chemotherapy: Secondary | ICD-10-CM | POA: Insufficient documentation

## 2023-04-23 DIAGNOSIS — E162 Hypoglycemia, unspecified: Secondary | ICD-10-CM | POA: Insufficient documentation

## 2023-04-23 DIAGNOSIS — Z79899 Other long term (current) drug therapy: Secondary | ICD-10-CM | POA: Insufficient documentation

## 2023-04-23 DIAGNOSIS — R9431 Abnormal electrocardiogram [ECG] [EKG]: Secondary | ICD-10-CM | POA: Insufficient documentation

## 2023-04-23 DIAGNOSIS — Z79811 Long term (current) use of aromatase inhibitors: Secondary | ICD-10-CM | POA: Insufficient documentation

## 2023-04-23 DIAGNOSIS — C50211 Malignant neoplasm of upper-inner quadrant of right female breast: Secondary | ICD-10-CM | POA: Insufficient documentation

## 2023-04-23 NOTE — Progress Notes (Addendum)
 [facility]       Telephone: [phone]?Fax: [phone]   Oncology Clinical Pharmacist Practitioner Initial Assessment  Rachel Singleton is a 43 y.o. female with a diagnosis of breast cancer. They were contacted today via in-person visit.  Indication/Regimen Ribociclib  (Kisqali) is being used appropriately for treatment of adjuvant breast cancer by Dr. Vinay Gudena.      Wt Readings from Last 1 Encounters:  04/23/23 142 lb 3.2 oz (64.5 kg)    Estimated body surface area is 1.75 meters squared as calculated from the following:   Height as of this encounter: 5' 7 (1.702 m).   Weight as of this encounter: 142 lb 3.2 oz (64.5 kg).  Baseline QTc Interval (Ribociclib  manufacturer guidelines use QTcF,  ECG reports QTcB) QTcB: 428 ms on 04/23/23 QTcF: 410 ms on 04/23/23  The dosing regimen is 400 mg by mouth daily on days 1 to 21 of a 28-day cycle. This is being given  in combination with anastrozole  . It is planned to continue until three years in the adjuvant setting per the NATALEE trial data. Prescription dose and frequency assessed for appropriateness.  Patient has agreed to treatment which is documented in physician note on 04/16/23. Counseled patient on administration, dosing, side effects, monitoring, drug-food interactions, safe handling, storage, and disposal.  Patient has not received medication but still working on PAP. She was not interested in obtaining a voucher. She will start when she receives ribociclib  and see Dr. Odean with labs and EKG in 3 weeks.  Dr. Odean reviewed EKG and QTc baseline is above. She will be sent to Dr. Bensimhon in cardiology per Dr. Odean recommendation.  Dose Modifications Full dose in this setting   Access Assessment [name] will be receiving ribociclib  through  [employer] patient support Insurance Concerns: none Start date if known: TBD  Adherence Assessment Reviewed importance on keeping a med schedule  and plan for any missed doses Barriers to adherence identified? No  Allergies Allergies  Allergen Reactions   Aluminum-Containing Compounds Hives and Rash    Vitals    04/23/2023    9:49 AM 04/16/2023   10:49 AM 04/02/2023   10:12 AM  Oncology Vitals  Height 170 cm 170 cm   Weight 64.501 kg 63.322 kg 64.139 kg  Weight (lbs) 142 lbs 3 oz 139 lbs 10 oz 141 lbs 6 oz  BMI 22.27 kg/m2 21.86 kg/m2 22.15 kg/m2  Temp 98.5 F (36.9 C) 97.7 F (36.5 C) 98.5 F (36.9 C)  Pulse Rate 77 84 91  BP 124/68 117/57 112/70  Resp 18 18 18   SpO2 100 % 98 % 99 %  BSA (m2) 1.75 m2 1.73 m2 1.74 m2     Laboratory Data    Latest Ref Rng & Units 04/16/2023   10:23 AM 02/07/2023    1:24 PM 01/10/2023   10:23 AM  CBC EXTENDED  WBC 4.0 - 10.5 K/uL 6.2  5.3  12.6   RBC 3.87 - 5.11 MIL/uL 4.91  3.70  4.05   Hemoglobin 12.0 - 15.0 g/dL 86.1  88.9  87.9   HCT 36.0 - 46.0 % 41.5  34.4  36.7   Platelets 150 - 400 K/uL 215  266  284   NEUT# 1.7 - 7.7 K/uL 4.4   11.7   Lymph# 0.7 - 4.0 K/uL 1.1   0.7        Latest Ref Rng & Units 04/16/2023   10:23 AM 02/07/2023  1:24 PM 01/10/2023   10:23 AM  CMP  Glucose 70 - 99 mg/dL 82  86  895   BUN 6 - 20 mg/dL 19  17  17    Creatinine 0.44 - 1.00 mg/dL 9.12  9.42  9.40   Sodium 135 - 145 mmol/L 141  135  142   Potassium 3.5 - 5.1 mmol/L 4.4  3.8  4.2   Chloride 98 - 111 mmol/L 104  102  106   CO2 22 - 32 mmol/L 31  26  30    Calcium 8.9 - 10.3 mg/dL 9.5  8.7  9.7   Total Protein 6.5 - 8.1 g/dL 7.0  6.7  7.0   Total Bilirubin 0.0 - 1.2 mg/dL 0.6  0.8  0.5   Alkaline Phos 38 - 126 U/L 39  42  55   AST 15 - 41 U/L 17  18  22    ALT 0 - 44 U/L 18  19  38    Lab Results  Component Value Date   MG 1.8 04/16/2023   Lab Results  Component Value Date   PHOS 5.4 (H) 04/16/2023     Contraindications Contraindications were reviewed? Yes Contraindications to therapy were identified? No   Safety Precautions The following safety precautions for the use of  ribociclib  were reviewed:  Fever: reviewed the importance of having a thermometer and the Centers for Disease Control and Prevention (CDC) definition of fever which is 100.83F (38C) or higher. Patient should call 24/7 triage at [phone] if experiencing a fever or any other symptoms ILD / Pneumonitis Severe Cutaneous Reactions QTc prolongation Hepatobiliary toxicity Neutropenia Febrile Neutropenia Embryo-Fetal toxicity Lactation Infertility Avoid grapefruit products Avoid pomegranate products Nausea Vomiting Diarrhea Stomatitis Fatigue Alopecia  Medication Reconciliation Current Outpatient Medications  Medication Sig Dispense Refill   anastrozole  (ARIMIDEX ) 1 MG tablet Take 1 tablet (1 mg total) by mouth daily. 90 tablet 3   Cholecalciferol (VITAMIN D3) 50 MCG (2000 UT) TABS Take 2,000 Int'l Units by mouth daily.     ribociclib  succ (KISQALI 400MG  DAILY DOSE) 200 MG Therapy Pack Take 2 tablets (400 mg total) by mouth daily. Take for 21 days on, 7 days off, repeat every 28 days. (Patient not taking: Reported on 04/23/2023) 42 tablet 3   No current facility-administered medications for this visit.    Medication reconciliation is based on the patient's most recent medication list in the electronic medical record (EMR) including herbal products and OTC medications.   The patient's medication list was reviewed today with the patient? Yes   Drug-drug interactions (DDIs) DDIs were evaluated? Yes Significant DDIs identified? No   Drug-Food Interactions Drug-food interactions were evaluated? Yes Drug-food interactions identified?  Avoid grapefruit products and pomegranate  Follow-up Plan  Patient education handout given to patient Start ribociclib  400 mg by mouth daily for 21 days, followed by a 7 day rest period (28 day supply). Will start once received. Continue anastrozole  1 mg by mouth daily Continue Vitamin D by mouth. Added to med list DEXA scan will be done on 05/07/23  and then every 2 years. Will have EKG, Labs, and Dr. Odean visit in 3 weeks. Baseline labs done on 04/16/23, monitor phosphorus and for toxicities. No labs done today Monitor EKG. Within limit today but continue to repeat if increases until stable Per Dr. Odean, will send cardiology referral Ms. Keech can follow up with clinical pharmacy as deemed necessary by Dr. Gudena Ms. Janish prefers to call the clinic should she experience  nausea. Can use loperamide for loose stool  [name] participated in the discussion, expressed understanding, and voiced agreement with the above plan. All questions were answered to her satisfaction. The patient was advised to contact the clinic at [phone] with any questions or concerns prior to her return visit.   I spent 60 minutes assessing the patient.  Bird Swetz A. Lucila, PharmD, BCOP, CPP  Norleen DELENA Lucila, RPH-CPP, 04/23/2023 10:18 AM  **Disclaimer: This note was dictated with voice recognition software. Similar sounding words can inadvertently be transcribed and this note may contain transcription errors which may not have been corrected upon publication of note.**

## 2023-04-23 NOTE — Addendum Note (Signed)
 Addended by: Althea Atkinson on: 04/23/2023 10:24 AM   Modules accepted: Orders

## 2023-04-23 NOTE — Telephone Encounter (Signed)
 Oral Oncology Patient Advocate Encounter  Reconsideration form and proof of dependents from patient's 1040 sent to [employer] via e-fax [phone].  [name], CPhT-Adv Oncology Pharmacy Patient Advocate [facility] Direct Number: [phone]  Fax: [phone]

## 2023-04-23 NOTE — Telephone Encounter (Signed)
 Oral Oncology Patient Advocate Encounter  [employer] read patient's household size as 2 and denied the application.  I have requested the statement showing number of dependents that goes along with patient's 1040 so that this information can be submitted and the case can be appealed with the corrected household size.  [name], CPhT-Adv Oncology Pharmacy Patient Advocate [facility] Direct Number: [phone]  Fax: [phone]

## 2023-04-24 ENCOUNTER — Other Ambulatory Visit: Payer: Self-pay

## 2023-04-25 NOTE — Telephone Encounter (Signed)
 Oral Oncology Patient Advocate Encounter  Called and confirmed reconsideration request had been received by [employer]. Documentation has been received and may take up to 1 additional business day for the review to be completed.  I will continue to check back for updates until a final determination is made.  [name], CPhT-Adv Oncology Pharmacy Patient Advocate [facility] Direct Number: [phone]  Fax: [phone]

## 2023-04-28 NOTE — Telephone Encounter (Signed)
 Oral Oncology Patient Advocate Encounter   Received notification that the application for assistance for Kisqali through [employer] has been approved.   [employer] phone number [phone].   Effective dates: 04/28/23 through 03/17/24  Medication will [facility] at [facility]  I have spoken to the patient.  Paulette Borrow, CPhT-Adv Oncology Pharmacy Patient Advocate [facility] Direct Number: [phone]  Fax: [phone]

## 2023-05-05 ENCOUNTER — Ambulatory Visit (HOSPITAL_BASED_OUTPATIENT_CLINIC_OR_DEPARTMENT_OTHER): Payer: Self-pay

## 2023-05-12 ENCOUNTER — Encounter: Payer: Self-pay | Admitting: Hematology and Oncology

## 2023-05-12 ENCOUNTER — Telehealth (HOSPITAL_COMMUNITY): Payer: Self-pay | Admitting: Cardiology

## 2023-05-12 ENCOUNTER — Ambulatory Visit (HOSPITAL_BASED_OUTPATIENT_CLINIC_OR_DEPARTMENT_OTHER)
Admission: RE | Admit: 2023-05-12 | Discharge: 2023-05-12 | Disposition: A | Discharge status: Home / Self Care | Payer: Self-pay | Source: Ambulatory Visit | Attending: Adult Health | Admitting: Adult Health

## 2023-05-12 DIAGNOSIS — Z79811 Long term (current) use of aromatase inhibitors: Secondary | ICD-10-CM | POA: Insufficient documentation

## 2023-05-12 DIAGNOSIS — C50911 Malignant neoplasm of unspecified site of right female breast: Secondary | ICD-10-CM

## 2023-05-12 NOTE — Telephone Encounter (Signed)
 Breast Center rep called to check the status of referral-treatment pending cardiology evaluation.  Echo 2/26@2  DB 3/3 @ 9   Echo order placed Pt aware via breast center repo

## 2023-05-13 ENCOUNTER — Other Ambulatory Visit: Payer: Self-pay

## 2023-05-13 NOTE — Assessment & Plan Note (Signed)
 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 Oncotype DX score 25 11/21/2022: CT CAP: No metastatic disease.  Suspected acute PE left lower lobe 11/21/2022: CT angiogram: No PE, pneumonia 11/27/2022: Bone scan: No metastatic disease.   Treatment plan: Adjuvant chemotherapy with Taxotere and Cytoxan every 3 weeks x 4 completed 01/10/2023  Adjuvant antiestrogen therapy with anastrozole (oophorectomy planned for 02/11/2023) ------------------------------------------------------------------------------------------------------------------------------------ Positive Signatera test:  CT CAP 11/21/2022: Suspected acute PE (CT angiogram chest: No PE).  No metastatic disease Bone scan 11/27/2022: Benign 02/11/2023: Laparoscopic BSO 02/28/2023: Bilateral breast implant placement   Anastrozole toxicities: Tolerating it extremely well.   Ribociclib Toxicities:

## 2023-05-14 ENCOUNTER — Inpatient Hospital Stay: Payer: Self-pay

## 2023-05-14 ENCOUNTER — Ambulatory Visit (HOSPITAL_COMMUNITY)
Admission: RE | Admit: 2023-05-14 | Discharge: 2023-05-14 | Disposition: A | Discharge status: Home / Self Care | Payer: Self-pay | Source: Ambulatory Visit | Attending: Internal Medicine | Admitting: Internal Medicine

## 2023-05-14 ENCOUNTER — Inpatient Hospital Stay (HOSPITAL_BASED_OUTPATIENT_CLINIC_OR_DEPARTMENT_OTHER): Payer: Self-pay | Admitting: Hematology and Oncology

## 2023-05-14 VITALS — BP 118/69 | HR 92 | Temp 98.2°F | Resp 18 | Ht 67.0 in | Wt 142.5 lb

## 2023-05-14 DIAGNOSIS — C50211 Malignant neoplasm of upper-inner quadrant of right female breast: Secondary | ICD-10-CM

## 2023-05-14 DIAGNOSIS — C50911 Malignant neoplasm of unspecified site of right female breast: Secondary | ICD-10-CM | POA: Insufficient documentation

## 2023-05-14 DIAGNOSIS — Z0189 Encounter for other specified special examinations: Secondary | ICD-10-CM

## 2023-05-14 LAB — CBC WITH DIFFERENTIAL (CANCER CENTER ONLY)
Abs Immature Granulocytes: 0.01 10*3/uL (ref 0.00–0.07)
Basophils Absolute: 0 10*3/uL (ref 0.0–0.1)
Basophils Relative: 1 %
Eosinophils Absolute: 0.1 10*3/uL (ref 0.0–0.5)
Eosinophils Relative: 3 %
HCT: 42.7 % (ref 36.0–46.0)
Hemoglobin: 14.1 g/dL (ref 12.0–15.0)
Immature Granulocytes: 0 %
Lymphocytes Relative: 22 %
Lymphs Abs: 1.1 10*3/uL (ref 0.7–4.0)
MCH: 28 pg (ref 26.0–34.0)
MCHC: 33 g/dL (ref 30.0–36.0)
MCV: 84.7 fL (ref 80.0–100.0)
Monocytes Absolute: 0.3 10*3/uL (ref 0.1–1.0)
Monocytes Relative: 5 %
Neutro Abs: 3.4 10*3/uL (ref 1.7–7.7)
Neutrophils Relative %: 69 %
Platelet Count: 231 10*3/uL (ref 150–400)
RBC: 5.04 MIL/uL (ref 3.87–5.11)
RDW: 13.2 % (ref 11.5–15.5)
WBC Count: 4.9 10*3/uL (ref 4.0–10.5)
nRBC: 0 % (ref 0.0–0.2)

## 2023-05-14 LAB — ECHOCARDIOGRAM COMPLETE
Area-P 1/2: 3.34 cm2
Calc EF: 68.8 %
Height: 67 in
S' Lateral: 2.9 cm
Single Plane A2C EF: 66.5 %
Single Plane A4C EF: 72 %
Weight: 2280 [oz_av]

## 2023-05-14 LAB — CMP (CANCER CENTER ONLY)
ALT: 18 U/L (ref 0–44)
AST: 15 U/L (ref 15–41)
Albumin: 4.6 g/dL (ref 3.5–5.0)
Alkaline Phosphatase: 44 U/L (ref 38–126)
Anion gap: 5 (ref 5–15)
BUN: 17 mg/dL (ref 6–20)
CO2: 33 mmol/L — ABNORMAL HIGH (ref 22–32)
Calcium: 9.7 mg/dL (ref 8.9–10.3)
Chloride: 104 mmol/L (ref 98–111)
Creatinine: 0.9 mg/dL (ref 0.44–1.00)
GFR, Estimated: >60 mL/min (ref >60)
Glucose, Bld: 59 mg/dL — ABNORMAL LOW (ref 70–99)
Potassium: 4.2 mmol/L (ref 3.5–5.1)
Sodium: 142 mmol/L (ref 135–145)
Total Bilirubin: 0.6 mg/dL (ref 0.0–1.2)
Total Protein: 7.2 g/dL (ref 6.5–8.1)

## 2023-05-14 NOTE — Progress Notes (Signed)
 Patient Care Team: Deatra James, MD as PCP - General (Family Medicine) Serena Croissant, MD as Consulting Physician (Hematology and Oncology) Manus Rudd, MD as Consulting Physician (General Surgery) Dorothy Puffer, MD as Consulting Physician (Radiation Oncology)  DIAGNOSIS:  Encounter Diagnosis  Name Primary?   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 Report date is 09/17/2022.  The CancerNext-Expanded gene panel offered by [facility] and includes sequencing, rearrangement, and RNA analysis for the following 71 genes: AIP, ALK, APC, ATM, AXIN2, BAP1, BARD1, BMPR1A, BRCA1, BRCA2, BRIP1, CDC73, CDH1, CDK4, CDKN1B, CDKN2A, CHEK2, CTNNA1, DICER1, FH, FLCN, KIF1B, LZTR1, MAX, MEN1, MET, MLH1, MSH2, MSH3, MSH6, MUTYH, NF1, NF2, NTHL1, PALB2, PHOX2B, PMS2, POT1, PRKAR1A, PTCH1, PTEN, RAD51C, RAD51D, RB1, RET, SDHA, SDHAF2, SDHB, SDHC, SDHD, SMAD4, SMARCA4, SMARCB1, SMARCE1, STK11, SUFU, TMEM127, TP53, TSC1, TSC2, and VHL (sequencing and deletion/duplication); EGFR, EGLN1, HOXB13, KIT, MITF, PDGFRA, POLD1, and POLE (sequencing only); EPCAM and GREM1 (deletion/duplication only).    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 Oncotype DX score 25   11/08/2022 -  Chemotherapy   Patient is on Treatment Plan : BREAST TC q21d     01/2023 -  Anti-estrogen oral therapy   Anastrozole x 7 years   04/02/2023 Cancer Staging   Staging form: Breast, AJCC 8th Edition - Pathologic: Stage IB (pT2, pN0, cM0, G3, ER+, PR+, HER2-, Oncotype DX score: 25) - Signed by Loa Socks, NP on 04/02/2023 Multigene prognostic tests performed: Oncotype DX Recurrence score range: Greater than or equal to 11 Histologic grading system: 3 grade system     CHIEF COMPLIANT: Follow-up on Ribociclib with anastrozole  HISTORY OF PRESENT ILLNESS:   History of Present Illness The patient, with a history of cancer, presents for a follow-up visit after starting ribociclib two weeks ago. She reports feeling better now than before starting the medication and denies any new or worsening fatigue. She has not noticed any cardiac rhythm abnormalities.  The patient also mentions a history of bilateral mastectomy with implant reconstruction and expresses a willingness to discuss her experiences with other patients. She has used cold capping during chemotherapy and found it beneficial, both for preserving hair and for psychological reasons, as she felt less sick when she didn't look sick. She is willing to discuss this experience with other patients as well.  The patient also mentions having low blood sugar, which she attributes to a long-term avoidance of dietary sugar. She does not express any concern about this.     ALLERGIES:  is allergic to aluminum-containing compounds.  MEDICATIONS:  Current Outpatient Medications  Medication Sig Dispense Refill   anastrozole (ARIMIDEX) 1 MG tablet  Take 1 tablet (1 mg total) by mouth daily. 90 tablet 3   Cholecalciferol (VITAMIN D3) 50  MCG (2000 UT) TABS Take 2,000 Int'l Units by mouth daily.     ribociclib succ (KISQALI 400MG  DAILY DOSE) 200 MG Therapy Pack Take 2 tablets (400 mg total) by mouth daily. Take for 21 days on, 7 days off, repeat every 28 days. 42 tablet 3   No current facility-administered medications for this visit.    PHYSICAL EXAMINATION: ECOG PERFORMANCE STATUS: 1 - Symptomatic but completely ambulatory  Vitals:   05/14/23 0859  BP: 118/69  Pulse: 92  Resp: 18  Temp: 98.2 F (36.8 C)  SpO2: 100%   Filed Weights   05/14/23 0859  Weight: 142 lb 8 oz (64.6 kg)    Physical Exam   (exam performed in the presence of a chaperone)  LABORATORY DATA:  I have reviewed the data as listed    Latest Ref Rng & Units 05/14/2023    8:20 AM 04/16/2023   10:23 AM 02/07/2023    1:24 PM  CMP  Glucose 70 - 99 mg/dL 59  82  86   BUN 6 - 20 mg/dL 17  19  17    Creatinine 0.44 - 1.00 mg/dL 1.61  0.96  0.45   Sodium 135 - 145 mmol/L 142  141  135   Potassium 3.5 - 5.1 mmol/L 4.2  4.4  3.8   Chloride 98 - 111 mmol/L 104  104  102   CO2 22 - 32 mmol/L 33  31  26   Calcium 8.9 - 10.3 mg/dL 9.7  9.5  8.7   Total Protein 6.5 - 8.1 g/dL 7.2  7.0  6.7   Total Bilirubin 0.0 - 1.2 mg/dL 0.6  0.6  0.8   Alkaline Phos 38 - 126 U/L 44  39  42   AST 15 - 41 U/L 15  17  18    ALT 0 - 44 U/L 18  18  19      Lab Results  Component Value Date   WBC 4.9 05/14/2023   HGB 14.1 05/14/2023   HCT 42.7 05/14/2023   MCV 84.7 05/14/2023   PLT 231 05/14/2023   NEUTROABS 3.4 05/14/2023    ASSESSMENT & PLAN:  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 Oncotype DX score 25 11/21/2022: CT CAP: No metastatic disease.  Suspected acute PE left lower  lobe 11/21/2022: CT angiogram: No PE, pneumonia 11/27/2022: Bone scan: No metastatic disease.   Treatment plan: Adjuvant chemotherapy with Taxotere and Cytoxan every 3 weeks x 4 completed 01/10/2023  Adjuvant antiestrogen therapy with anastrozole (oophorectomy planned for 02/11/2023) ------------------------------------------------------------------------------------------------------------------------------------ Positive Signatera test:  CT CAP 11/21/2022: Suspected acute PE (CT angiogram chest: No PE).  No metastatic disease Bone scan 11/27/2022: Benign 02/11/2023: Laparoscopic BSO 02/28/2023: Bilateral breast implant placement   Anastrozole toxicities: Tolerating it extremely well.   Ribociclib Toxicities:  Tolerating Ribociclib extremely well.  Denies any fatigue or nausea or any GI disturbances. EKG 05/14/23: QTc: 447 Return to clinic in 1 month with labs and EKG and follow-up Assessment & Plan Breast Cancer on Ribociclib Tolerating medication well with no reported side effects. QTc interval within normal limits. Neutrophil count satisfactory. -Continue Ribociclib 400mg  daily for 3 weeks on, 1 week off. -Repeat EKG and labs  in 1 month.  Patient Support Patient expressed willingness to provide support and share experiences with other patients undergoing similar treatments. -Consider patient for future peer support opportunities.  Hypoglycemia Noted low blood sugar on today's labs. Patient reports no intake of sugar for 10 years. -Monitor blood sugar levels.      No orders of the defined types were placed in this encounter.  The patient has a good understanding of the overall plan. she agrees with it. she will call with any problems that may develop before the next visit here. Total time spent: 30 mins including face to face time and time spent for planning, charting and co-ordination of care   Tamsen Meek, MD 05/14/23

## 2023-05-15 ENCOUNTER — Other Ambulatory Visit: Payer: Self-pay

## 2023-05-17 ENCOUNTER — Other Ambulatory Visit: Payer: Self-pay

## 2023-05-18 NOTE — Progress Notes (Signed)
 CARDIO-ONCOLOGY CLINIC CONSULT NOTE  Referring Physician: Dr. Pamelia Hoit Primary Care: Deatra James, MD Primary Cardiologist: None  Chief complaint: Breast cancer  HPI: Rachel Singleton is a 43 y.o. female with right breast cancer referred by Dr. Pamelia Hoit for enrollment into the Cardio-Oncology program.  Diagnosed with R breast CA in 6/24. (ER+/PR+/HER2-). In 7/24 underwent bilateral mastectomies.   8/24 Treated with Taxotere and Cytoxan completed 10/24. Adjuvant anastrozole started 11/24.  Ribociclib (Kisqali started) due to high-risk features, including grade three pathology and a high oncotype score.   Echo 2/25 EF 60-65% Personally reviewed   Past Medical History:  Diagnosis Date   Asthma    pregnancy only   Breast cancer (HCC) 08/21/2022   Former smoker 03/18/2006   Quit 2008   Hx of varicella    Pneumonia    few months ago    Current Outpatient Medications  Medication Sig Dispense Refill   anastrozole (ARIMIDEX) 1 MG tablet Take 1 tablet (1 mg total) by mouth daily. 90 tablet 3   Cholecalciferol (VITAMIN D3) 50 MCG (2000 UT) TABS Take 2,000 Int'l Units by mouth daily.     ribociclib succ (KISQALI 400MG  DAILY DOSE) 200 MG Therapy Pack Take 2 tablets (400 mg total) by mouth daily. Take for 21 days on, 7 days off, repeat every 28 days. 42 tablet 3   No current facility-administered medications for this encounter.    Allergies  Allergen Reactions   Aluminum-Containing Compounds Hives and Rash      Social History   Socioeconomic History   Marital status: Married    Spouse name: Not on file   Number of children: Not on file   Years of education: Not on file   Highest education level: Not on file  Occupational History   Not on file  Tobacco Use   Smoking status: Former    Types: Cigarettes    Passive exposure: Past   Smokeless tobacco: Never  Vaping Use   Vaping status: Never Used  Substance and Sexual Activity   Alcohol use: No   Drug use: No   Sexual  activity: Yes    Birth control/protection: None  Other Topics Concern   Not on file  Social History Narrative   ** Merged History Encounter **       Social Drivers of Health   Financial Resource Strain: Not on file  Food Insecurity: No Food Insecurity (09/04/2022)   Hunger Vital Sign    Worried About Running Out of Food in the Last Year: Never true    Ran Out of Food in the Last Year: Never true  Transportation Needs: No Transportation Needs (09/04/2022)   PRAPARE - Administrator, Civil Service (Medical): No    Lack of Transportation (Non-Medical): No  Physical Activity: Not on file  Stress: Not on file  Social Connections: Unknown (07/31/2021)   Received from [facility]   Social Network    Social Network: Not on file  Intimate Partner Violence: Not At Risk (09/04/2022)   Humiliation, Afraid, Rape, and Kick questionnaire    Fear of Current or Ex-Partner: No    Emotionally Abused: No    Physically Abused: No    Sexually Abused: No      Family History  Problem Relation Age of Onset   Breast cancer Mother 62       DCIS (-/-)   Heart disease Father    Heart attack Father    Cystic fibrosis Brother  Colon cancer Maternal Grandfather 52 - 75   Skin cancer Maternal Aunt 60 - 69   Breast cancer Paternal Aunt 47 - 69   Breast cancer Other 50 - 59   Ovarian cancer Neg Hx    Endometrial cancer Neg Hx     There were no vitals filed for this visit.  PHYSICAL EXAM: General:  Well appearing. No respiratory difficulty HEENT: normal Neck: supple. no JVD. Carotids 2+ bilat; no bruits. No lymphadenopathy or thryomegaly appreciated. Cor: PMI nondisplaced. Regular rate & rhythm. No rubs, gallops or murmurs. Lungs: clear Abdomen: soft, nontender, nondistended. No hepatosplenomegaly. No bruits or masses. Good bowel sounds. Extremities: no cyanosis, clubbing, rash, edema Neuro: alert & oriented x 3, cranial nerves grossly intact. moves all 4 extremities  w/o difficulty. Affect pleasant.  ECG 05/14/23: Sinus 87. Normal QTc Personally reviewed    ASSESSMENT & PLAN:  1. Right Breast Cancer - Diagnosed with R breast CA in 6/24. (ER+/PR+/HER2-). - 7/24 underwent bilateral mastectomies.  - 8/24 Treated with Taxotere and Cytoxan completed 10/24. Adjuvant anastrozole started 11/24. - Ribociclib (Kisqali started).  - Echo 2/25 EF 60-65% Personally reviewed  2. Chemotherapy drug monitoring - Kisqali can cause QT prolongation and cardiac arrhythmias    Arvilla Meres, MD  5:48 PM

## 2023-05-19 ENCOUNTER — Ambulatory Visit (HOSPITAL_COMMUNITY)
Admission: RE | Admit: 2023-05-19 | Discharge: 2023-05-19 | Disposition: A | Discharge status: Home / Self Care | Payer: Self-pay | Source: Ambulatory Visit | Attending: Internal Medicine | Admitting: Internal Medicine

## 2023-05-19 VITALS — BP 110/70 | HR 78 | Wt 145.2 lb

## 2023-05-19 DIAGNOSIS — Z9013 Acquired absence of bilateral breasts and nipples: Secondary | ICD-10-CM | POA: Insufficient documentation

## 2023-05-19 DIAGNOSIS — Z803 Family history of malignant neoplasm of breast: Secondary | ICD-10-CM | POA: Insufficient documentation

## 2023-05-19 DIAGNOSIS — C50211 Malignant neoplasm of upper-inner quadrant of right female breast: Secondary | ICD-10-CM | POA: Insufficient documentation

## 2023-05-19 DIAGNOSIS — C50911 Malignant neoplasm of unspecified site of right female breast: Secondary | ICD-10-CM

## 2023-05-19 DIAGNOSIS — Z79811 Long term (current) use of aromatase inhibitors: Secondary | ICD-10-CM | POA: Insufficient documentation

## 2023-05-19 NOTE — Patient Instructions (Signed)
      Follow-Up in: Please follow up with the [facility] in 6 months with Dr. Reuben Likes. We currently do not have that schedule. Please call us back in July in order to schedule that appointment for September.   At the [facility], you and your health needs are our priority. We have a designated team specialized in the treatment of Heart Failure. This Care Team includes your primary Heart Failure Specialized Cardiologist (physician), Advanced Practice Providers (APPs- Physician Assistants and Nurse Practitioners), and Pharmacist who all work together to provide you with the care you need, when you need it.   You may see any of the following providers on your designated Care Team at your next follow up:  Dr. Arvilla Meres Dr. Marca Ancona Dr. Dorthula Nettles Dr. Theresia Bough Tonye Becket, NP Robbie Lis, [place] [facility] [place], [place] Brynda Peon, NP [place] Lee, NP Karle Plumber, PharmD   Please be sure to bring in all your medications bottles to every appointment.   Need to Contact us:  If you have any questions or concerns before your next appointment please send [place] a message through [place] or call our office at [phone].    TO LEAVE A MESSAGE FOR THE NURSE SELECT OPTION 2, PLEASE LEAVE A MESSAGE INCLUDING: YOUR NAME DATE OF BIRTH CALL BACK NUMBER REASON FOR CALL**this is important as we prioritize the call backs  YOU WILL RECEIVE A CALL BACK THE SAME DAY AS LONG AS YOU CALL BEFORE 4:00 PM

## 2023-05-21 ENCOUNTER — Other Ambulatory Visit: Payer: Self-pay

## 2023-05-22 ENCOUNTER — Encounter: Payer: Self-pay | Admitting: Hematology and Oncology

## 2023-06-11 ENCOUNTER — Inpatient Hospital Stay (HOSPITAL_BASED_OUTPATIENT_CLINIC_OR_DEPARTMENT_OTHER): Payer: Self-pay | Admitting: Hematology and Oncology

## 2023-06-11 ENCOUNTER — Inpatient Hospital Stay: Payer: Self-pay

## 2023-06-11 ENCOUNTER — Inpatient Hospital Stay: Payer: Self-pay | Attending: Hematology & Oncology

## 2023-06-11 VITALS — BP 121/70 | HR 83 | Temp 98.7°F | Resp 18 | Ht 67.0 in | Wt 144.0 lb

## 2023-06-11 DIAGNOSIS — Z79811 Long term (current) use of aromatase inhibitors: Secondary | ICD-10-CM | POA: Insufficient documentation

## 2023-06-11 DIAGNOSIS — C50211 Malignant neoplasm of upper-inner quadrant of right female breast: Secondary | ICD-10-CM | POA: Insufficient documentation

## 2023-06-11 DIAGNOSIS — Z1721 Progesterone receptor positive status: Secondary | ICD-10-CM | POA: Insufficient documentation

## 2023-06-11 DIAGNOSIS — E162 Hypoglycemia, unspecified: Secondary | ICD-10-CM | POA: Insufficient documentation

## 2023-06-11 DIAGNOSIS — Z9013 Acquired absence of bilateral breasts and nipples: Secondary | ICD-10-CM | POA: Insufficient documentation

## 2023-06-11 DIAGNOSIS — Z170 Estrogen receptor positive status [ER+]: Secondary | ICD-10-CM | POA: Insufficient documentation

## 2023-06-11 DIAGNOSIS — Z1732 Human epidermal growth factor receptor 2 negative status: Secondary | ICD-10-CM | POA: Insufficient documentation

## 2023-06-11 LAB — CBC WITH DIFFERENTIAL (CANCER CENTER ONLY)
Abs Immature Granulocytes: 0 10*3/uL (ref 0.00–0.07)
Basophils Absolute: 0.1 10*3/uL (ref 0.0–0.1)
Basophils Relative: 2 %
Eosinophils Absolute: 0.1 10*3/uL (ref 0.0–0.5)
Eosinophils Relative: 4 %
HCT: 42.3 % (ref 36.0–46.0)
Hemoglobin: 14 g/dL (ref 12.0–15.0)
Immature Granulocytes: 0 %
Lymphocytes Relative: 37 %
Lymphs Abs: 1.2 10*3/uL (ref 0.7–4.0)
MCH: 28.6 pg (ref 26.0–34.0)
MCHC: 33.1 g/dL (ref 30.0–36.0)
MCV: 86.3 fL (ref 80.0–100.0)
Monocytes Absolute: 0.3 10*3/uL (ref 0.1–1.0)
Monocytes Relative: 8 %
Neutro Abs: 1.6 10*3/uL — ABNORMAL LOW (ref 1.7–7.7)
Neutrophils Relative %: 49 %
Platelet Count: 297 10*3/uL (ref 150–400)
RBC: 4.9 MIL/uL (ref 3.87–5.11)
RDW: 14.5 % (ref 11.5–15.5)
WBC Count: 3.3 10*3/uL — ABNORMAL LOW (ref 4.0–10.5)
nRBC: 0 % (ref 0.0–0.2)

## 2023-06-11 LAB — CMP (CANCER CENTER ONLY)
ALT: 15 U/L (ref 0–44)
AST: 14 U/L — ABNORMAL LOW (ref 15–41)
Albumin: 4.8 g/dL (ref 3.5–5.0)
Alkaline Phosphatase: 48 U/L (ref 38–126)
Anion gap: 6 (ref 5–15)
BUN: 18 mg/dL (ref 6–20)
CO2: 33 mmol/L — ABNORMAL HIGH (ref 22–32)
Calcium: 9.7 mg/dL (ref 8.9–10.3)
Chloride: 104 mmol/L (ref 98–111)
Creatinine: 0.82 mg/dL (ref 0.44–1.00)
GFR, Estimated: >60 mL/min (ref >60)
Glucose, Bld: 52 mg/dL — ABNORMAL LOW (ref 70–99)
Potassium: 4.1 mmol/L (ref 3.5–5.1)
Sodium: 143 mmol/L (ref 135–145)
Total Bilirubin: 0.5 mg/dL (ref 0.0–1.2)
Total Protein: 7.6 g/dL (ref 6.5–8.1)

## 2023-06-11 LAB — PHOSPHORUS: Phosphorus: 5 mg/dL — ABNORMAL HIGH (ref 2.5–4.6)

## 2023-06-11 LAB — MAGNESIUM: Magnesium: 2 mg/dL (ref 1.7–2.4)

## 2023-06-11 NOTE — Progress Notes (Signed)
 Patient Care Team: Deatra James, MD as PCP - General (Family Medicine) Serena Croissant, MD as Consulting Physician (Hematology and Oncology) Manus Rudd, MD as Consulting Physician (General Surgery) Dorothy Puffer, MD as Consulting Physician (Radiation Oncology)  DIAGNOSIS:  Encounter Diagnosis  Name Primary?   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 Report date is 09/17/2022.  The CancerNext-Expanded gene panel offered by [facility] and includes sequencing, rearrangement, and RNA analysis for the following 71 genes: AIP, ALK, APC, ATM, AXIN2, BAP1, BARD1, BMPR1A, BRCA1, BRCA2, BRIP1, CDC73, CDH1, CDK4, CDKN1B, CDKN2A, CHEK2, CTNNA1, DICER1, FH, FLCN, KIF1B, LZTR1, MAX, MEN1, MET, MLH1, MSH2, MSH3, MSH6, MUTYH, NF1, NF2, NTHL1, PALB2, PHOX2B, PMS2, POT1, PRKAR1A, PTCH1, PTEN, RAD51C, RAD51D, RB1, RET, SDHA, SDHAF2, SDHB, SDHC, SDHD, SMAD4, SMARCA4, SMARCB1, SMARCE1, STK11, SUFU, TMEM127, TP53, TSC1, TSC2, and VHL (sequencing and deletion/duplication); EGFR, EGLN1, HOXB13, KIT, MITF, PDGFRA, POLD1, and POLE (sequencing only); EPCAM and GREM1 (deletion/duplication only).    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 Oncotype DX score 25   11/08/2022 -  Chemotherapy   Patient is on Treatment Plan : BREAST TC q21d     01/2023 -  Anti-estrogen oral therapy   Anastrozole x 7 years   04/02/2023 Cancer Staging   Staging form: Breast, AJCC 8th Edition - Pathologic: Stage IB (pT2, pN0, cM0, G3, ER+, PR+, HER2-, Oncotype DX score: 25) - Signed by Loa Socks, NP on 04/02/2023 Multigene prognostic tests performed: Oncotype DX Recurrence score range: Greater than or equal to 11 Histologic grading system: 3 grade system     CHIEF COMPLIANT: Follow-up on Ribociclib  HISTORY OF PRESENT ILLNESS:   History of Present Illness The patient, who is currently on Ribociclib and Anastrozole for cancer treatment, reports feeling more energetic now than in the years leading up to her cancer diagnosis. She denies experiencing fatigue. However, she has noticed some knee pain, particularly when getting up and down or squatting. She attributes this to her medication, Anastrozole. She also reports that her blood sugar levels have been on the lower side recently, despite eating regularly. She denies experiencing symptoms of hypoglycemia such as dizziness or headaches, but mentions that she tends to feel shaky if she skips a meal.     ALLERGIES:  is allergic to aluminum-containing compounds.  MEDICATIONS:  Current Outpatient Medications  Medication Sig Dispense Refill   anastrozole (ARIMIDEX) 1 MG tablet Take 1 tablet (1 mg total) by mouth daily. 90 tablet 3   Cholecalciferol (VITAMIN D3) 50 MCG (2000 UT) TABS Take 2,000 Int'l Units by mouth daily.     ribociclib succ (KISQALI 400MG  DAILY DOSE) 200 MG Therapy  Pack Take 2 tablets (400 mg total) by mouth daily. Take for 21 days on, 7 days off, repeat every 28 days. 42 tablet 3   No current  facility-administered medications for this visit.    PHYSICAL EXAMINATION: ECOG PERFORMANCE STATUS: 1 - Symptomatic but completely ambulatory  Vitals:   06/11/23 1029  BP: 121/70  Pulse: 83  Resp: 18  Temp: 98.7 F (37.1 C)  SpO2: 100%   Filed Weights   06/11/23 1029  Weight: 144 lb (65.3 kg)      LABORATORY DATA:  I have reviewed the data as listed    Latest Ref Rng & Units 06/11/2023    9:10 AM 05/14/2023    8:20 AM 04/16/2023   10:23 AM  CMP  Glucose 70 - 99 mg/dL 52  59  82   BUN 6 - 20 mg/dL 18  17  19    Creatinine 0.44 - 1.00 mg/dL 9.56  2.13  0.86   Sodium 135 - 145 mmol/L 143  142  141   Potassium 3.5 - 5.1 mmol/L 4.1  4.2  4.4   Chloride 98 - 111 mmol/L 104  104  104   CO2 22 - 32 mmol/L 33  33  31   Calcium 8.9 - 10.3 mg/dL 9.7  9.7  9.5   Total Protein 6.5 - 8.1 g/dL 7.6  7.2  7.0   Total Bilirubin 0.0 - 1.2 mg/dL 0.5  0.6  0.6   Alkaline Phos 38 - 126 U/L 48  44  39   AST 15 - 41 U/L 14  15  17    ALT 0 - 44 U/L 15  18  18      Lab Results  Component Value Date   WBC 3.3 (L) 06/11/2023   HGB 14.0 06/11/2023   HCT 42.3 06/11/2023   MCV 86.3 06/11/2023   PLT 297 06/11/2023   NEUTROABS 1.6 (L) 06/11/2023    ASSESSMENT & PLAN:  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 Oncotype DX score 25 11/21/2022: CT CAP: No metastatic disease.  Suspected acute PE left lower lobe 11/21/2022: CT angiogram: No PE, pneumonia 11/27/2022: Bone scan: No metastatic disease.   Treatment plan: Adjuvant chemotherapy with Taxotere and Cytoxan every 3 weeks x 4 completed 01/10/2023  Adjuvant antiestrogen therapy with anastrozole (oophorectomy planned for  02/11/2023) ------------------------------------------------------------------------------------------------------------------------------------ Positive Signatera test:  CT CAP 11/21/2022: Suspected acute PE (CT angiogram chest: No PE).  No metastatic disease Bone scan 11/27/2022: Benign 02/11/2023: Laparoscopic BSO 02/28/2023: Bilateral breast implant placement   Anastrozole toxicities: Tolerating it extremely well.   Ribociclib Toxicities: Started 04/16/2023 Tolerating Ribociclib extremely well.  Denies any fatigue or nausea or any GI disturbances. EKG 05/14/23: QTc: 447 EKG 06/11/2023: QTc 442 Return to clinic in 2 month with labs and EKG and follow-up  Assessment & Plan Primary malignant neoplasm of upper inner quadrant of right breast Undergoing ribociclib and anastrozole treatment. EKGs, blood work, and organ functions stable. Increased energy levels. Mild knee pain likely from anastrozole. - Continue ribociclib with current dosing schedule. - Continue anastrozole. - Encourage daily stretching and walking to manage knee pain. - Schedule follow-up in two months. - Arrange tumor cell blood work with [employer]  Hypoglycemia Two episodes of hypoglycemia without medication-induced cause. Possible ribociclib relation, though uncommon. - Monitor for  symptoms of hypoglycemia such as headaches and dizziness. - Ensure regular meals to prevent hypoglycemia.      Orders Placed This Encounter  Procedures   CBC with Differential (Cancer Center Only)    Standing Status:   Future    Expiration Date:   06/10/2024   CMP (Cancer Center only)    Standing Status:   Future    Expiration Date:   06/10/2024   Magnesium    Standing Status:   Future    Expiration Date:   06/10/2024   The patient has a good understanding of the overall plan. she agrees with it. she will call with any problems that may develop before the next visit here. Total time spent: 30 mins including face to face time and  time spent for planning, charting and co-ordination of care   Tamsen Meek, MD 06/11/23

## 2023-06-11 NOTE — Assessment & Plan Note (Signed)
 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 Oncotype DX score 25 11/21/2022: CT CAP: No metastatic disease.  Suspected acute PE left lower lobe 11/21/2022: CT angiogram: No PE, pneumonia 11/27/2022: Bone scan: No metastatic disease.   Treatment plan: Adjuvant chemotherapy with Taxotere and Cytoxan every 3 weeks x 4 completed 01/10/2023  Adjuvant antiestrogen therapy with anastrozole (oophorectomy planned for 02/11/2023) ------------------------------------------------------------------------------------------------------------------------------------ Positive Signatera test:  CT CAP 11/21/2022: Suspected acute PE (CT angiogram chest: No PE).  No metastatic disease Bone scan 11/27/2022: Benign 02/11/2023: Laparoscopic BSO 02/28/2023: Bilateral breast implant placement   Anastrozole toxicities: Tolerating it extremely well.   Ribociclib Toxicities:  Tolerating Ribociclib extremely well.  Denies any fatigue or nausea or any GI disturbances. EKG 05/14/23: QTc: 447 Return to clinic in 1 month with labs and EKG and follow-up

## 2023-06-12 ENCOUNTER — Other Ambulatory Visit: Payer: Self-pay

## 2023-06-12 ENCOUNTER — Encounter: Payer: Self-pay | Admitting: Hematology and Oncology

## 2023-06-18 ENCOUNTER — Encounter: Payer: Self-pay | Admitting: Hematology and Oncology

## 2023-07-01 ENCOUNTER — Other Ambulatory Visit: Payer: Self-pay

## 2023-07-02 ENCOUNTER — Other Ambulatory Visit: Payer: Self-pay

## 2023-08-05 LAB — SIGNATERA ONLY (NATERA MANAGED)
SIGNATERA MTM READOUT: 0 MTM/ml
SIGNATERA TEST RESULT: NEGATIVE

## 2023-08-05 NOTE — Assessment & Plan Note (Signed)
 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 Oncotype DX score 25 11/21/2022: CT CAP: No metastatic disease.  Suspected acute PE left lower lobe 11/21/2022: CT angiogram: No PE, pneumonia 11/27/2022: Bone scan: No metastatic disease.   Treatment plan: Adjuvant chemotherapy with Taxotere  and Cytoxan  every 3 weeks x 4 completed 01/10/2023  Adjuvant antiestrogen therapy with anastrozole  (oophorectomy planned for 02/11/2023) ------------------------------------------------------------------------------------------------------------------------------------ Positive Signatera test:  CT CAP 11/21/2022: Suspected acute PE (CT angiogram chest: No PE).  No metastatic disease Bone scan 11/27/2022: Benign 02/11/2023: Laparoscopic BSO 02/28/2023: Bilateral breast implant placement   Anastrozole  toxicities: Tolerating it extremely well.   Ribociclib  Toxicities: Started 04/16/2023 Tolerating Ribociclib  extremely well.  Denies any fatigue or nausea or any GI disturbances. EKG 05/14/23: QTc: 447 EKG 06/11/2023: QTc 442 Return to clinic in 2 month with labs and EKG and follow-up

## 2023-08-06 ENCOUNTER — Inpatient Hospital Stay: Payer: Self-pay

## 2023-08-06 ENCOUNTER — Inpatient Hospital Stay (HOSPITAL_BASED_OUTPATIENT_CLINIC_OR_DEPARTMENT_OTHER): Payer: Self-pay | Admitting: Hematology and Oncology

## 2023-08-06 ENCOUNTER — Inpatient Hospital Stay: Payer: Self-pay | Attending: Hematology & Oncology

## 2023-08-06 VITALS — BP 110/76 | HR 72 | Temp 97.8°F | Resp 18 | Ht 67.0 in | Wt 149.8 lb

## 2023-08-06 DIAGNOSIS — C50211 Malignant neoplasm of upper-inner quadrant of right female breast: Secondary | ICD-10-CM

## 2023-08-06 DIAGNOSIS — Z170 Estrogen receptor positive status [ER+]: Secondary | ICD-10-CM | POA: Insufficient documentation

## 2023-08-06 DIAGNOSIS — Z1721 Progesterone receptor positive status: Secondary | ICD-10-CM | POA: Insufficient documentation

## 2023-08-06 DIAGNOSIS — Z1732 Human epidermal growth factor receptor 2 negative status: Secondary | ICD-10-CM | POA: Insufficient documentation

## 2023-08-06 DIAGNOSIS — Z9013 Acquired absence of bilateral breasts and nipples: Secondary | ICD-10-CM | POA: Insufficient documentation

## 2023-08-06 DIAGNOSIS — Z79811 Long term (current) use of aromatase inhibitors: Secondary | ICD-10-CM | POA: Insufficient documentation

## 2023-08-06 LAB — CMP (CANCER CENTER ONLY)
ALT: 16 U/L (ref 0–44)
AST: 16 U/L (ref 15–41)
Albumin: 4.5 g/dL (ref 3.5–5.0)
Alkaline Phosphatase: 47 U/L (ref 38–126)
Anion gap: 6 (ref 5–15)
BUN: 21 mg/dL — ABNORMAL HIGH (ref 6–20)
CO2: 33 mmol/L — ABNORMAL HIGH (ref 22–32)
Calcium: 9.2 mg/dL (ref 8.9–10.3)
Chloride: 103 mmol/L (ref 98–111)
Creatinine: 0.95 mg/dL (ref 0.44–1.00)
GFR, Estimated: >60 mL/min (ref >60)
Glucose, Bld: 73 mg/dL (ref 70–99)
Potassium: 4.5 mmol/L (ref 3.5–5.1)
Sodium: 142 mmol/L (ref 135–145)
Total Bilirubin: 0.5 mg/dL (ref 0.0–1.2)
Total Protein: 6.9 g/dL (ref 6.5–8.1)

## 2023-08-06 LAB — CBC WITH DIFFERENTIAL (CANCER CENTER ONLY)
Abs Immature Granulocytes: 0 10*3/uL (ref 0.00–0.07)
Basophils Absolute: 0.1 10*3/uL (ref 0.0–0.1)
Basophils Relative: 2 %
Eosinophils Absolute: 0.1 10*3/uL (ref 0.0–0.5)
Eosinophils Relative: 4 %
HCT: 39.9 % (ref 36.0–46.0)
Hemoglobin: 13.6 g/dL (ref 12.0–15.0)
Immature Granulocytes: 0 %
Lymphocytes Relative: 27 %
Lymphs Abs: 1 10*3/uL (ref 0.7–4.0)
MCH: 30.7 pg (ref 26.0–34.0)
MCHC: 34.1 g/dL (ref 30.0–36.0)
MCV: 90.1 fL (ref 80.0–100.0)
Monocytes Absolute: 0.3 10*3/uL (ref 0.1–1.0)
Monocytes Relative: 8 %
Neutro Abs: 2.1 10*3/uL (ref 1.7–7.7)
Neutrophils Relative %: 59 %
Platelet Count: 272 10*3/uL (ref 150–400)
RBC: 4.43 MIL/uL (ref 3.87–5.11)
RDW: 13.2 % (ref 11.5–15.5)
WBC Count: 3.5 10*3/uL — ABNORMAL LOW (ref 4.0–10.5)
nRBC: 0 % (ref 0.0–0.2)

## 2023-08-06 LAB — PHOSPHORUS: Phosphorus: 5.1 mg/dL — ABNORMAL HIGH (ref 2.5–4.6)

## 2023-08-06 LAB — MAGNESIUM: Magnesium: 2 mg/dL (ref 1.7–2.4)

## 2023-08-06 NOTE — Progress Notes (Signed)
 Patient Care Team: Sun, [name], MD as PCP - General (Family Medicine) Cameron Cea, MD as Consulting Physician (Hematology and Oncology) Dareen Ebbing, MD as Consulting Physician (General Surgery) Johna Myers, MD as Consulting Physician (Radiation Oncology)  DIAGNOSIS:  Encounter Diagnosis  Name Primary?   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 Report date is 09/17/2022.  The CancerNext-Expanded gene panel offered by [facility] and includes sequencing, rearrangement, and RNA analysis for the following 71 genes: AIP, ALK, APC, ATM, AXIN2, BAP1, BARD1, BMPR1A, BRCA1, BRCA2, BRIP1, CDC73, CDH1, CDK4, CDKN1B, CDKN2A, CHEK2, CTNNA1, DICER1, FH, FLCN, KIF1B, LZTR1, MAX, MEN1, MET, MLH1, MSH2, MSH3, MSH6, MUTYH, NF1, NF2, NTHL1, PALB2, PHOX2B, PMS2, POT1, PRKAR1A, PTCH1, PTEN, RAD51C, RAD51D, RB1, RET, SDHA, SDHAF2, SDHB, SDHC, SDHD, SMAD4, SMARCA4, SMARCB1, SMARCE1, STK11, SUFU, TMEM127, TP53, TSC1, TSC2, and VHL (sequencing and deletion/duplication); EGFR, EGLN1, HOXB13, KIT, MITF, PDGFRA, POLD1, and POLE (sequencing only); EPCAM and GREM1 (deletion/duplication only).    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 Oncotype DX score 25   11/08/2022 -  Chemotherapy   Patient is on Treatment Plan : BREAST TC q21d     01/2023 -  Anti-estrogen oral therapy   Anastrozole  x 7 years   04/02/2023 Cancer Staging   Staging form: Breast, AJCC 8th Edition - Pathologic: Stage IB (pT2, pN0, cM0, G3, ER+, PR+, HER2-, Oncotype DX score: 25) - Signed by Percival Brace, NP on 04/02/2023 Multigene prognostic tests performed: Oncotype DX Recurrence score range: Greater than or equal to 11 Histologic grading system: 3 grade system     CHIEF COMPLIANT: Follow-up on anastrozole  with Ribociclib   HISTORY OF PRESENT ILLNESS:   History of Present Illness Rachel Singleton is a 43 year old female who presents for follow-up regarding her EKG findings and blood work.  She is concerned about inverted T waves in leads V1, V2, V3, and possibly V4 on her EKG, which have been consistent with previous EKGs and appear more prominent now. She recalls a cardiology consultation discussing the QT interval.  She experiences low blood sugar, with shakiness if she does not eat, and follows a diet that is anti-inflammatory and sugar-free. Her blood sugar is 73 today, which she considers an improvement.  Her recent blood work shows a total white blood cell count of 3.5, with neutrophils at 2.1. She inquires about the implications of her decreased white blood cell count and potential increased susceptibility to illness. She denies fatigue or tiredness.  She expresses concern about cancer recurrence, particularly in relation to a friend's cancer trajectory, and seeks reassurance about monitoring for recurrence, especially in the brain.  ALLERGIES:  is allergic to aluminum-containing compounds.  MEDICATIONS:  Current Outpatient Medications  Medication Sig  Dispense Refill   anastrozole  (ARIMIDEX ) 1 MG tablet Take 1 tablet (1 mg total) by mouth daily. 90 tablet 3   Cholecalciferol (VITAMIN D3) 50 MCG (2000 UT) TABS Take 2,000 Int'l Units by mouth daily.     ribociclib  succ (KISQALI 400MG  DAILY DOSE) 200 MG Therapy Pack Take 2 tablets (400 mg total) by mouth daily. Take for 21 days on, 7 days off, repeat every 28 days. 42 tablet 3   No current facility-administered medications for this visit.    PHYSICAL EXAMINATION: ECOG PERFORMANCE STATUS: 1 - Symptomatic but completely ambulatory  Vitals:   08/06/23 1100  BP: 110/76  Pulse: 72  Resp: 18  Temp: 97.8 F (36.6 C)  SpO2: 100%   Filed Weights   08/06/23 1100  Weight: 149 lb 12.8 oz (67.9 kg)    LABORATORY DATA:  I have reviewed the data as listed    Latest Ref Rng & Units 08/06/2023    9:47 AM 06/11/2023    9:10 AM 05/14/2023    8:20 AM  CMP  Glucose 70 - 99 mg/dL 73  52  59   BUN 6 - 20 mg/dL 21  18  17    Creatinine 0.44 - 1.00 mg/dL 1.61  0.96  0.45   Sodium 135 - 145 mmol/L 142  143  142   Potassium 3.5 - 5.1 mmol/L 4.5  4.1  4.2   Chloride 98 - 111 mmol/L 103  104  104   CO2 22 - 32 mmol/L 33  33  33   Calcium 8.9 - 10.3 mg/dL 9.2  9.7  9.7   Total Protein 6.5 - 8.1 g/dL 6.9  7.6  7.2   Total Bilirubin 0.0 - 1.2 mg/dL 0.5  0.5  0.6   Alkaline Phos 38 - 126 U/L 47  48  44   AST 15 - 41 U/L 16  14  15    ALT 0 - 44 U/L 16  15  18      Lab Results  Component Value Date   WBC 3.5 (L) 08/06/2023   HGB 13.6 08/06/2023   HCT 39.9 08/06/2023   MCV 90.1 08/06/2023   PLT 272 08/06/2023   NEUTROABS 2.1 08/06/2023    ASSESSMENT & PLAN:  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 Oncotype DX score  25 11/21/2022: CT CAP: No metastatic disease.  Suspected acute PE left lower lobe 11/21/2022: CT angiogram: No PE, pneumonia 11/27/2022: Bone scan: No metastatic disease.   Treatment plan: Adjuvant chemotherapy with Taxotere  and Cytoxan  every 3 weeks x 4 completed 01/10/2023  Adjuvant antiestrogen therapy with anastrozole  (oophorectomy planned for 02/11/2023) ------------------------------------------------------------------------------------------------------------------------------------ Positive Signatera test:  CT CAP 11/21/2022: Suspected acute PE (CT angiogram chest: No PE).  No metastatic disease Bone scan 11/27/2022: Benign 02/11/2023: Laparoscopic BSO 02/28/2023: Bilateral breast implant placement   Anastrozole  toxicities: Tolerating it extremely well.   Ribociclib  Toxicities: Started 04/16/2023 Tolerating Ribociclib  extremely well.  Denies any fatigue or nausea or any GI disturbances. EKG 05/14/23: QTc: 447 EKG 06/11/2023: QTc 442 Return to clinic in 3 month with labs and EKG and follow-up     Assessment & Plan Primary malignant neoplasm of upper inner quadrant of right breast Well-managed with  no recurrence. Natera test negative for circulating tumor DNA. Discussed test limitations, especially in detecting brain metastases due to blood-brain barrier. HER2 negative status reduces brain metastasis risk. Emphasized importance of reporting persistent symptoms. - Continue Anastrozole  and Ribociclib . - Schedule follow-up labs and EKG in three months.  Hypoglycemia Low blood sugar likely related to dietary habits. Current level 73, within normal range. Lifelong tendency towards hypoglycemia with irregular eating. - Continue monitoring blood sugar levels. - Maintain current dietary habits.      No orders of the defined types were placed in this encounter.  The patient has a good understanding of the overall plan. she agrees with it. she will call with any problems that may develop  before the next visit here. Total time spent: 30 mins including face to face time and time spent for planning, charting and co-ordination of care   Viinay K Donavin Audino, MD 08/06/23

## 2023-08-07 ENCOUNTER — Other Ambulatory Visit: Payer: Self-pay

## 2023-08-12 ENCOUNTER — Encounter: Payer: Self-pay | Admitting: Hematology and Oncology

## 2023-08-12 ENCOUNTER — Other Ambulatory Visit: Payer: Self-pay | Admitting: *Deleted

## 2023-08-12 MED ORDER — RIBOCICLIB SUCC (400 MG DOSE) 200 MG PO TBPK
400.0000 mg | ORAL_TABLET | Freq: Every day | ORAL | 3 refills | Status: DC
Start: 2023-08-12 — End: 2023-11-21

## 2023-08-15 ENCOUNTER — Encounter: Payer: Self-pay | Admitting: Hematology and Oncology

## 2023-10-08 ENCOUNTER — Other Ambulatory Visit: Payer: Self-pay

## 2023-11-11 NOTE — Assessment & Plan Note (Signed)
 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 Oncotype DX score 25 11/21/2022: CT CAP: No metastatic disease.  Suspected acute PE left lower lobe 11/21/2022: CT angiogram: No PE, pneumonia 11/27/2022: Bone scan: No metastatic disease.   Treatment plan: Adjuvant chemotherapy with Taxotere  and Cytoxan  every 3 weeks x 4 completed 01/10/2023  Adjuvant antiestrogen therapy with anastrozole  (oophorectomy 02/11/2023).  Ribociclib  added 04/16/2023 ------------------------------------------------------------------------------------------------------------------------------------ Positive Signatera test:  CT CAP 11/21/2022: Suspected acute PE (CT angiogram chest: No PE).  No metastatic disease Bone scan 11/27/2022: Benign 02/11/2023: Laparoscopic BSO 02/28/2023: Bilateral breast implant placement   Anastrozole  toxicities: Tolerating it extremely well.   Ribociclib  Toxicities: Started 04/16/2023 Tolerating Ribociclib  extremely well.  Denies any fatigue or nausea or any GI disturbances. EKG 05/14/23: QTc: 447 EKG 06/11/2023: QTc 442 Return to clinic in 3 month with labs and EKG and follow-up

## 2023-11-12 ENCOUNTER — Inpatient Hospital Stay: Payer: Self-pay | Attending: Hematology & Oncology | Admitting: Hematology and Oncology

## 2023-11-12 ENCOUNTER — Inpatient Hospital Stay: Payer: Self-pay

## 2023-11-12 VITALS — BP 110/70 | HR 76 | Temp 98.7°F | Resp 18 | Ht 67.0 in | Wt 155.4 lb

## 2023-11-12 DIAGNOSIS — R635 Abnormal weight gain: Secondary | ICD-10-CM | POA: Insufficient documentation

## 2023-11-12 DIAGNOSIS — Z1732 Human epidermal growth factor receptor 2 negative status: Secondary | ICD-10-CM | POA: Insufficient documentation

## 2023-11-12 DIAGNOSIS — Z170 Estrogen receptor positive status [ER+]: Secondary | ICD-10-CM | POA: Insufficient documentation

## 2023-11-12 DIAGNOSIS — C50211 Malignant neoplasm of upper-inner quadrant of right female breast: Secondary | ICD-10-CM

## 2023-11-12 DIAGNOSIS — Z9013 Acquired absence of bilateral breasts and nipples: Secondary | ICD-10-CM | POA: Insufficient documentation

## 2023-11-12 DIAGNOSIS — Z79811 Long term (current) use of aromatase inhibitors: Secondary | ICD-10-CM | POA: Insufficient documentation

## 2023-11-12 DIAGNOSIS — Z1721 Progesterone receptor positive status: Secondary | ICD-10-CM | POA: Insufficient documentation

## 2023-11-12 DIAGNOSIS — G8918 Other acute postprocedural pain: Secondary | ICD-10-CM | POA: Insufficient documentation

## 2023-11-12 DIAGNOSIS — E160 Drug-induced hypoglycemia without coma: Secondary | ICD-10-CM | POA: Insufficient documentation

## 2023-11-12 LAB — CBC WITH DIFFERENTIAL (CANCER CENTER ONLY)
Abs Immature Granulocytes: 0.01 K/uL (ref 0.00–0.07)
Basophils Absolute: 0.1 K/uL (ref 0.0–0.1)
Basophils Relative: 2 %
Eosinophils Absolute: 0.1 K/uL (ref 0.0–0.5)
Eosinophils Relative: 4 %
HCT: 38.9 % (ref 36.0–46.0)
Hemoglobin: 13.3 g/dL (ref 12.0–15.0)
Immature Granulocytes: 0 %
Lymphocytes Relative: 32 %
Lymphs Abs: 0.9 K/uL (ref 0.7–4.0)
MCH: 31 pg (ref 26.0–34.0)
MCHC: 34.2 g/dL (ref 30.0–36.0)
MCV: 90.7 fL (ref 80.0–100.0)
Monocytes Absolute: 0.3 K/uL (ref 0.1–1.0)
Monocytes Relative: 9 %
Neutro Abs: 1.5 K/uL — ABNORMAL LOW (ref 1.7–7.7)
Neutrophils Relative %: 53 %
Platelet Count: 203 K/uL (ref 150–400)
RBC: 4.29 MIL/uL (ref 3.87–5.11)
RDW: 12.8 % (ref 11.5–15.5)
WBC Count: 2.9 K/uL — ABNORMAL LOW (ref 4.0–10.5)
nRBC: 0 % (ref 0.0–0.2)

## 2023-11-12 LAB — CMP (CANCER CENTER ONLY)
ALT: 16 U/L (ref 0–44)
AST: 16 U/L (ref 15–41)
Albumin: 4.4 g/dL (ref 3.5–5.0)
Alkaline Phosphatase: 48 U/L (ref 38–126)
Anion gap: 6 (ref 5–15)
BUN: 16 mg/dL (ref 6–20)
CO2: 31 mmol/L (ref 22–32)
Calcium: 9.1 mg/dL (ref 8.9–10.3)
Chloride: 105 mmol/L (ref 98–111)
Creatinine: 0.93 mg/dL (ref 0.44–1.00)
GFR, Estimated: >60 mL/min (ref >60)
Glucose, Bld: 70 mg/dL (ref 70–99)
Potassium: 3.9 mmol/L (ref 3.5–5.1)
Sodium: 142 mmol/L (ref 135–145)
Total Bilirubin: 0.7 mg/dL (ref 0.0–1.2)
Total Protein: 6.7 g/dL (ref 6.5–8.1)

## 2023-11-12 LAB — MAGNESIUM: Magnesium: 1.8 mg/dL (ref 1.7–2.4)

## 2023-11-12 LAB — PHOSPHORUS: Phosphorus: 4.1 mg/dL (ref 2.5–4.6)

## 2023-11-12 NOTE — Progress Notes (Signed)
 Patient Care Team: Sun, [name], MD as PCP - General (Family Medicine) Odean Potts, MD as Consulting Physician (Hematology and Oncology) Belinda Cough, MD as Consulting Physician (General Surgery) Dewey Rush, MD as Consulting Physician (Radiation Oncology)  DIAGNOSIS:  Encounter Diagnosis  Name Primary?   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 Report date is 09/17/2022.  The CancerNext-Expanded gene panel offered by [facility] and includes sequencing, rearrangement, and RNA analysis for the following 71 genes: AIP, ALK, APC, ATM, AXIN2, BAP1, BARD1, BMPR1A, BRCA1, BRCA2, BRIP1, CDC73, CDH1, CDK4, CDKN1B, CDKN2A, CHEK2, CTNNA1, DICER1, FH, FLCN, KIF1B, LZTR1, MAX, MEN1, MET, MLH1, MSH2, MSH3, MSH6, MUTYH, NF1, NF2, NTHL1, PALB2, PHOX2B, PMS2, POT1, PRKAR1A, PTCH1, PTEN, RAD51C, RAD51D, RB1, RET, SDHA, SDHAF2, SDHB, SDHC, SDHD, SMAD4, SMARCA4, SMARCB1, SMARCE1, STK11, SUFU, TMEM127, TP53, TSC1, TSC2, and VHL (sequencing and deletion/duplication); EGFR, EGLN1, HOXB13, KIT, MITF, PDGFRA, POLD1, and POLE (sequencing only); EPCAM and GREM1 (deletion/duplication only).    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 Oncotype DX score 25   11/08/2022 -  Chemotherapy   Patient is on Treatment Plan : BREAST TC q21d     01/2023 -  Anti-estrogen oral therapy   Anastrozole  x 7 years   04/02/2023 Cancer Staging   Staging form: Breast, AJCC 8th Edition - Pathologic: Stage IB (pT2, pN0, cM0, G3, ER+, PR+, HER2-, Oncotype DX score: 25) - Signed by Crawford Morna Pickle, NP on 04/02/2023 Multigene prognostic tests performed: Oncotype DX Recurrence score range: Greater than or equal to 11 Histologic grading system: 3 grade system     CHIEF COMPLIANT: Follow-up on Ribociclib  and anastrozole  therapy  HISTORY OF PRESENT ILLNESS:  History of Present Illness Rachel Singleton is a 43 year old female with breast cancer who presents for follow-up regarding her treatment with Kisqali.  She experiences one or two hot flashes per day, each lasting about a minute. She is concerned about weight gain despite daily walking, strength training three times a week, and a diet free of sugar and white flour.  Tenderness is noted around her surgery sites, including the lymph node removal area, tumor site, and port scar, with increased sensitivity in the past month.  She has been on Kisqali for approximately six months. Her current neutrophil count is 1.5.  Signatera testing for breast cancer recurrence has shown two negative results in November and May, following a slightly positive result immediately after surgery.     ALLERGIES:  is allergic to aluminum-containing compounds.  MEDICATIONS:  Current Outpatient Medications  Medication Sig Dispense Refill   anastrozole  (ARIMIDEX ) 1 MG tablet Take 1 tablet (1 mg total) by mouth daily.  90 tablet 3   Cholecalciferol (VITAMIN D3) 50 MCG (2000 UT) TABS Take 2,000 Int'l Units by mouth daily.     ribociclib  succ  (KISQALI 400MG  DAILY DOSE) 200 MG Therapy Pack Take 2 tablets (400 mg total) by mouth daily. Take for 21 days on, 7 days off, repeat every 28 days. 42 tablet 3   No current facility-administered medications for this visit.    PHYSICAL EXAMINATION: ECOG PERFORMANCE STATUS: 1 - Symptomatic but completely ambulatory  There were no vitals filed for this visit. There were no vitals filed for this visit.   LABORATORY DATA:  I have reviewed the data as listed    Latest Ref Rng & Units 08/06/2023    9:47 AM 06/11/2023    9:10 AM 05/14/2023    8:20 AM  CMP  Glucose 70 - 99 mg/dL 73  52  59   BUN 6 - 20 mg/dL 21  18  17    Creatinine 0.44 - 1.00 mg/dL 9.04  9.17  9.09   Sodium 135 - 145 mmol/L 142  143  142   Potassium 3.5 - 5.1 mmol/L 4.5  4.1  4.2   Chloride 98 - 111 mmol/L 103  104  104   CO2 22 - 32 mmol/L 33  33  33   Calcium 8.9 - 10.3 mg/dL 9.2  9.7  9.7   Total Protein 6.5 - 8.1 g/dL 6.9  7.6  7.2   Total Bilirubin 0.0 - 1.2 mg/dL 0.5  0.5  0.6   Alkaline Phos 38 - 126 U/L 47  48  44   AST 15 - 41 U/L 16  14  15    ALT 0 - 44 U/L 16  15  18      Lab Results  Component Value Date   WBC 3.5 (L) 08/06/2023   HGB 13.6 08/06/2023   HCT 39.9 08/06/2023   MCV 90.1 08/06/2023   PLT 272 08/06/2023   NEUTROABS 2.1 08/06/2023    ASSESSMENT & PLAN:  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 Oncotype DX score 25 11/21/2022: CT CAP: No metastatic disease.  Suspected acute PE left lower lobe 11/21/2022: CT angiogram: No PE, pneumonia 11/27/2022: Bone scan: No metastatic disease.   Treatment plan: Adjuvant chemotherapy with Taxotere  and Cytoxan  every 3 weeks x 4 completed 01/10/2023  Adjuvant antiestrogen therapy with  anastrozole  (oophorectomy 02/11/2023).  Ribociclib  added 04/16/2023 ------------------------------------------------------------------------------------------------------------------------------------ Positive Signatera test:  CT CAP 11/21/2022: Suspected acute PE (CT angiogram chest: No PE).  No metastatic disease Bone scan 11/27/2022: Benign 02/11/2023: Laparoscopic BSO 02/28/2023: Bilateral breast implant placement   Anastrozole  toxicities: Tolerating it extremely well. Breast cancer surveillance: Signatera: Negative   Ribociclib  Toxicities: Started 04/16/2023 Tolerating Ribociclib  extremely well.  Denies any fatigue or nausea or any GI disturbances. EKG 05/14/23: QTc: 447 EKG 06/11/2023: QTc 442 Return to clinic in 3 month with labs and EKG and follow-up  Assessment & Plan Primary malignant neoplasm of upper-inner quadrant of right breast, status post-surgical resection, on adjuvant therapy On Ribociclib  and Anastrozole  for six months. Neutrophil count at 1.5, within acceptable range. Hemoglobin and platelet counts normal. No recurrence per negative Signatera tests. Tenderness at surgical sites due to nerve regeneration. - Continue Ribociclib  and Anastrozole . - Order EKG for QT interval assessment. - Continue Signatera testing biannually.  Weight gain associated with cancer therapy Weight gain despite disciplined diet and exercise, attributed to hormonal changes. Intermittent fasting discussed but unsuitable due to hypoglycemia. - Encourage adherence to diet and exercise.  Hypoglycemia associated with cancer therapy Low blood sugar potentially exacerbated by Ribociclib , limiting intermittent fasting as a weight management strategy.  Post-surgical pain at breast and lymph node sites Tenderness at surgery sites due to nerve regeneration, not concerning for pathology.      No orders of the defined types were placed in this encounter.  The patient has a good understanding of the  overall plan. she agrees with it. she will call with any problems that may develop before the next visit here. Total time spent: 30 mins including face to face time and time spent for planning, charting and co-ordination of care   Naomi MARLA Chad, MD 11/12/23

## 2023-11-13 ENCOUNTER — Other Ambulatory Visit: Payer: Self-pay

## 2023-11-18 ENCOUNTER — Encounter: Payer: Self-pay | Admitting: Hematology and Oncology

## 2023-11-21 ENCOUNTER — Other Ambulatory Visit: Payer: Self-pay | Admitting: *Deleted

## 2023-11-21 MED ORDER — RIBOCICLIB SUCC (400 MG DOSE) 200 MG PO TBPK: 400.0000 mg | ORAL_TABLET | Freq: Every day | ORAL | 3 refills | Status: DC

## 2023-12-10 ENCOUNTER — Telehealth: Payer: Self-pay | Admitting: *Deleted

## 2023-12-10 NOTE — Telephone Encounter (Signed)
 Received call from pt with complaint of intermittent headache x several months.  Pt states she does have a hx of neck pain but is concerned for possible cancer.  Pt requesting to be seen for further evaluation and tx.  RN reviewed with MD and verbal orders received to scheduled pt for [facility] visit.  Appt scheduled, pt educated and verbalized understanding.

## 2023-12-11 ENCOUNTER — Encounter: Payer: Self-pay | Admitting: Adult Health

## 2023-12-11 ENCOUNTER — Inpatient Hospital Stay: Payer: Self-pay | Attending: Hematology & Oncology | Admitting: Adult Health

## 2023-12-11 VITALS — BP 117/66 | HR 72 | Temp 99.3°F | Resp 18 | Wt 158.4 lb

## 2023-12-11 DIAGNOSIS — Z1721 Progesterone receptor positive status: Secondary | ICD-10-CM | POA: Insufficient documentation

## 2023-12-11 DIAGNOSIS — Z79811 Long term (current) use of aromatase inhibitors: Secondary | ICD-10-CM | POA: Insufficient documentation

## 2023-12-11 DIAGNOSIS — M542 Cervicalgia: Secondary | ICD-10-CM | POA: Insufficient documentation

## 2023-12-11 DIAGNOSIS — Z808 Family history of malignant neoplasm of other organs or systems: Secondary | ICD-10-CM | POA: Insufficient documentation

## 2023-12-11 DIAGNOSIS — C50211 Malignant neoplasm of upper-inner quadrant of right female breast: Secondary | ICD-10-CM | POA: Insufficient documentation

## 2023-12-11 DIAGNOSIS — G4452 New daily persistent headache (NDPH): Secondary | ICD-10-CM

## 2023-12-11 DIAGNOSIS — Z803 Family history of malignant neoplasm of breast: Secondary | ICD-10-CM | POA: Insufficient documentation

## 2023-12-11 DIAGNOSIS — Z170 Estrogen receptor positive status [ER+]: Secondary | ICD-10-CM | POA: Insufficient documentation

## 2023-12-11 DIAGNOSIS — R519 Headache, unspecified: Secondary | ICD-10-CM | POA: Insufficient documentation

## 2023-12-11 DIAGNOSIS — Z87891 Personal history of nicotine dependence: Secondary | ICD-10-CM | POA: Insufficient documentation

## 2023-12-11 NOTE — Progress Notes (Signed)
 [facility] Cancer Follow up:    Sun, [name], MD 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 [facility]) Staging form: Breast, AJCC 8th Edition - Clinical stage from 09/01/2022: Stage IIA (cT2, cN0, cM0, G3, ER+, PR+, HER2-) - Signed by Lanell Donald Stagger, PA-C on 09/01/2022 Stage prefix: Initial diagnosis Method of lymph node assessment: Clinical Histologic grading system: 3 grade system - Pathologic: Stage IB (pT2, pN0, cM0, G3, ER+, PR+, HER2-, Oncotype DX score: 25) - Signed by Crawford Morna Pickle, NP on 04/02/2023 Multigene prognostic tests performed: Oncotype DX Recurrence score range: Greater than or equal to 11 Histologic grading system: 3 grade system    SUMMARY OF ONCOLOGIC HISTORY: Oncology History  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 Report date is 09/17/2022.  The CancerNext-Expanded gene panel offered by [facility] and includes sequencing, rearrangement, and RNA analysis for the following 71 genes: AIP, ALK, APC, ATM, AXIN2, BAP1, BARD1, BMPR1A, BRCA1, BRCA2, BRIP1, CDC73, CDH1, CDK4, CDKN1B, CDKN2A, CHEK2, CTNNA1, DICER1, FH, FLCN, KIF1B, LZTR1, MAX, MEN1, MET, MLH1, MSH2, MSH3, MSH6, MUTYH, NF1, NF2, NTHL1, PALB2, PHOX2B, PMS2, POT1, PRKAR1A,  PTCH1, PTEN, RAD51C, RAD51D, RB1, RET, SDHA, SDHAF2, SDHB, SDHC, SDHD, SMAD4, SMARCA4, SMARCB1, SMARCE1, STK11, SUFU, TMEM127, TP53, TSC1, TSC2, and VHL (sequencing and deletion/duplication); EGFR, EGLN1, HOXB13, KIT, MITF, PDGFRA, POLD1, and POLE (sequencing only); EPCAM and GREM1 (deletion/duplication only).    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 Oncotype DX score 25   11/08/2022 -  Chemotherapy   Patient is on Treatment Plan : BREAST TC q21d     01/2023 -  Anti-estrogen oral therapy   Anastrozole  x 7 years   04/02/2023 Cancer Staging   Staging form: Breast, AJCC 8th Edition - Pathologic: Stage IB (pT2, pN0, cM0, G3, ER+, PR+, HER2-, Oncotype DX score: 25) - Signed by Crawford Morna Pickle, NP on 04/02/2023 Multigene prognostic tests performed: Oncotype DX Recurrence score range: Greater than or equal to 11 Histologic grading system: 3 grade system     CURRENT THERAPY: Anastrozole /Ribociclib   INTERVAL HISTORY:  Discussed the use of AI scribe software for clinical note transcription with the patient, who gave verbal consent to proceed.  History of Present Illness Rachel Singleton is a 43 year old female with breast cancer who presents with intermittent headache.  She experiences intermittent headaches for several months, with pain at the back of her head that worsens with head movement. The headaches are not constant and worsen with focus, but there are periods of remission. No vision changes, hearing changes, or balance issues. She has not undergone any imaging studies of her neck. The frequency and intensity of the headaches have remained stable.  She is undergoing treatment with anastrozole  and ribociclib  for breast cancer. Her most recent Signatera test was  negative.     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  MEDICAL HISTORY: Past Medical History:  Diagnosis Date   Asthma    pregnancy only   Breast cancer (HCC) 08/21/2022   Former smoker 03/18/2006   Quit 2008   Hx of varicella    Pneumonia    few months ago    SURGICAL HISTORY: Past Surgical History:  Procedure Laterality Date   BREAST BIOPSY Right 08/21/2022   MM RT BREAST BX W LOC DEV 1ST LESION IMAGE BX SPEC STEREO GUIDE 08/21/2022 [facility]   BREAST BIOPSY Right 08/21/2022   MM RT BREAST BX W LOC DEV EA AD LESION IMG BX SPEC STEREO GUIDE 08/21/2022 [facility]   BREAST BIOPSY Right 08/21/2022   US  RT BREAST BX W LOC DEV 1ST LESION IMG BX SPEC US  GUIDE 08/21/2022 [facility]   BREAST RECONSTRUCTION WITH PLACEMENT OF TISSUE EXPANDER AND ALLODERM Bilateral 09/17/2022   Procedure: BREAST RECONSTRUCTION WITH PLACEMENT OF TISSUE EXPANDER AND ALLODERM;  Surgeon: Arelia Filippo, MD;  Location: [facility];  Service: Plastics;  Laterality: Bilateral;   MASTECTOMY W/ SENTINEL NODE BIOPSY Right 09/17/2022   Procedure: RIGHT MASTECTOMY WITH SENTINEL LYMPH NODE BIOPSY;  Surgeon: Belinda Cough, MD;  Location: [facility];  Service: General;  Laterality: Right;   PORT-A-CATH REMOVAL  N/A 02/06/2023   Procedure: REMOVAL PORT-A-CATH;  Surgeon: Belinda Cough, MD;  Location: [facility];  Service: General;  Laterality: N/A;   PORTACATH PLACEMENT N/A 11/07/2022   Procedure: ULTRASOUND-GUIDED PORT PLACEMENT;  Surgeon: Belinda Cough, MD;  Location: [facility];  Service: General;  Laterality: N/A;  LMA   REMOVAL OF BILATERAL TISSUE EXPANDERS WITH PLACEMENT OF BILATERAL BREAST IMPLANTS Bilateral 02/28/2023   Procedure: REMOVAL OF BILATERAL TISSUE EXPANDERS WITH PLACEMENT  SALINE BILATERAL BREAST IMPLANTS;  Surgeon: Arelia Filippo, MD;  Location: [facility];  Service: Plastics;  Laterality: Bilateral;  fill volume bilaterally   ROBOTIC ASSISTED BILATERAL SALPINGO OOPHERECTOMY Bilateral 02/11/2023   Procedure: XI ROBOTIC ASSISTED BILATERAL SALPINGO OOPHORECTOMY;  Surgeon: Eldonna Mays, MD;  Location: [facility];  Service: Gynecology;  Laterality: Bilateral;   SIMPLE MASTECTOMY WITH AXILLARY SENTINEL NODE BIOPSY Left 09/17/2022   Procedure: LEFT SIMPLE MASTECTOMY;  Surgeon: Belinda Cough, MD;  Location: [facility];  Service: General;  Laterality: Left;   WISDOM TOOTH EXTRACTION      SOCIAL HISTORY: Social History   Socioeconomic History   Marital status: Married    Spouse name: Not on file   Number of children: Not on file   Years of education: Not on file   Highest education level: Not on file  Occupational History   Not on file  Tobacco Use   Smoking status: Former  Types: Cigarettes    Passive exposure: Past   Smokeless tobacco: Never  Vaping Use   Vaping status: Never Used  Substance and Sexual Activity   Alcohol use: No   Drug use: No   Sexual activity: Yes    Birth control/protection: None  Other Topics Concern   Not on file  Social History Narrative   ** Merged History Encounter **       Social Drivers of Health   Financial Resource Strain: Not on file  Food Insecurity: No Food Insecurity (09/04/2022)    Hunger Vital Sign    Worried About Running Out of Food in the Last Year: Never true    Ran Out of Food in the Last Year: Never true  Transportation Needs: No Transportation Needs (09/04/2022)   PRAPARE - Administrator, Civil Service (Medical): No    Lack of Transportation (Non-Medical): No  Physical Activity: Not on file  Stress: Not on file  Social Connections: Unknown (07/31/2021)   Received from [facility]   Social Network    Social Network: Not on file  Intimate Partner Violence: Not At Risk (09/04/2022)   Humiliation, Afraid, Rape, and Kick questionnaire    Fear of Current or Ex-Partner: No    Emotionally Abused: No    Physically Abused: No    Sexually Abused: No    FAMILY HISTORY: Family History  Problem Relation Age of Onset   Breast cancer Mother 37       DCIS (-/-)   Heart disease Father    Heart attack Father    Cystic fibrosis Brother    Colon cancer Maternal Grandfather 77 - 75   Skin cancer Maternal Aunt 60 - 69   Breast cancer Paternal Aunt 19 - 69   Breast cancer Other 50 - 59   Ovarian cancer Neg Hx    Endometrial cancer Neg Hx     Review of Systems  Constitutional:  Negative for appetite change, chills, fatigue, fever and unexpected weight change.  HENT:   Negative for hearing loss, lump/mass and trouble swallowing.   Eyes:  Negative for eye problems and icterus.  Respiratory:  Negative for chest tightness, cough and shortness of breath.   Cardiovascular:  Negative for chest pain, leg swelling and palpitations.  Gastrointestinal:  Negative for abdominal distention, abdominal pain, constipation, diarrhea, nausea and vomiting.  Endocrine: Negative for hot flashes.  Genitourinary:  Negative for difficulty urinating.   Musculoskeletal:  Negative for arthralgias.  Skin:  Negative for itching and rash.  Neurological:  Positive for headaches. Negative for dizziness, extremity weakness and numbness.  Hematological:  Negative for adenopathy. Does  not bruise/bleed easily.  Psychiatric/Behavioral:  Negative for depression. The patient is not nervous/anxious.       PHYSICAL EXAMINATION    Vitals:   12/11/23 1000  BP: 117/66  Pulse: 72  Resp: 18  Temp: 99.3 F (37.4 C)  SpO2: 100%    Physical Exam Constitutional:      General: She is not in acute distress.    Appearance: Normal appearance. She is not toxic-appearing.  HENT:     Head: Normocephalic and atraumatic.     Mouth/Throat:     Mouth: Mucous membranes are moist.     Pharynx: Oropharynx is clear. No oropharyngeal exudate or posterior oropharyngeal erythema.  Eyes:     General: No scleral icterus. Cardiovascular:     Rate and Rhythm: Normal rate and regular rhythm.     Pulses: Normal pulses.  Heart sounds: Normal heart sounds.  Pulmonary:     Effort: Pulmonary effort is normal.     Breath sounds: Normal breath sounds.  Abdominal:     General: Abdomen is flat. Bowel sounds are normal. There is no distension.     Palpations: Abdomen is soft.     Tenderness: There is no abdominal tenderness.  Musculoskeletal:        General: No swelling.     Cervical back: Neck supple.  Lymphadenopathy:     Cervical: No cervical adenopathy.  Skin:    General: Skin is warm and dry.     Findings: No rash.  Neurological:     General: No focal deficit present.     Mental Status: She is alert and oriented to person, place, and time.     Cranial Nerves: No cranial nerve deficit.     Sensory: No sensory deficit.     Motor: No weakness.  Psychiatric:        Mood and Affect: Mood normal.        Behavior: Behavior normal.     LABORATORY DATA:  CBC    Component Value Date/Time   WBC 2.9 (L) 11/12/2023 0800   WBC 5.3 02/07/2023 1324   RBC 4.29 11/12/2023 0800   HGB 13.3 11/12/2023 0800   HCT 38.9 11/12/2023 0800   PLT 203 11/12/2023 0800   MCV 90.7 11/12/2023 0800   MCH 31.0 11/12/2023 0800   MCHC 34.2 11/12/2023 0800   RDW 12.8 11/12/2023 0800   LYMPHSABS 0.9  11/12/2023 0800   MONOABS 0.3 11/12/2023 0800   EOSABS 0.1 11/12/2023 0800   BASOSABS 0.1 11/12/2023 0800    CMP     Component Value Date/Time   NA 142 11/12/2023 0800   K 3.9 11/12/2023 0800   CL 105 11/12/2023 0800   CO2 31 11/12/2023 0800   GLUCOSE 70 11/12/2023 0800   BUN 16 11/12/2023 0800   CREATININE 0.93 11/12/2023 0800   CALCIUM 9.1 11/12/2023 0800   PROT 6.7 11/12/2023 0800   ALBUMIN 4.4 11/12/2023 0800   AST 16 11/12/2023 0800   ALT 16 11/12/2023 0800   ALKPHOS 48 11/12/2023 0800   BILITOT 0.7 11/12/2023 0800   GFRNONAA >60 11/12/2023 0800   GFRAA >90 02/01/2013 0415     ASSESSMENT and THERAPY PLAN:   Assessment and Plan Assessment & Plan Breast cancer Undergoing treatment with anastrozole  and ribociclib . Recent Signatera test negative for circulating tumor DNA.  Post-traumatic neck pain and occipital headache Intermittent headache and neck pain likely post-traumatic, possibly due to whiplash. Differential includes cervical disc issues. - Order MRI of brain and neck to evaluate for metastatic disease and cervical disc issues. - Schedule follow-up in two weeks to discuss MRI results, phone appointment acceptable.     All questions were answered. The patient knows to call the clinic with any problems, questions or concerns. We can certainly see the patient much sooner if necessary.  Total encounter time:20 minutes*in face-to-face visit time, chart review, lab review, care coordination, order entry, and documentation of the encounter time.    Morna Kendall, NP 12/11/23 11:00 AM Medical Oncology and Hematology [facility] [address] [place], [place] [address] Tel. [phone]    Fax. [phone]  *Total Encounter Time as defined by the Centers for Medicare and Medicaid Services includes, in addition to the face-to-face time of a patient visit (documented in the note above) non-face-to-face time: obtaining and reviewing outside  history, ordering and  reviewing medications, tests or procedures, care coordination (communications with other health care professionals or caregivers) and documentation in the medical record.

## 2023-12-13 ENCOUNTER — Ambulatory Visit (HOSPITAL_COMMUNITY)
Admission: RE | Admit: 2023-12-13 | Discharge: 2023-12-13 | Disposition: A | Payer: Self-pay | Source: Ambulatory Visit | Attending: Adult Health | Admitting: Adult Health

## 2023-12-13 DIAGNOSIS — M542 Cervicalgia: Secondary | ICD-10-CM | POA: Insufficient documentation

## 2023-12-13 DIAGNOSIS — C50211 Malignant neoplasm of upper-inner quadrant of right female breast: Secondary | ICD-10-CM

## 2023-12-13 DIAGNOSIS — G4452 New daily persistent headache (NDPH): Secondary | ICD-10-CM | POA: Insufficient documentation

## 2023-12-13 MED ORDER — GADOBUTROL 1 MMOL/ML IV SOLN
7.0000 mL | Freq: Once | INTRAVENOUS | Status: AC | PRN
  Administered 2023-12-13: 7 mL via INTRAVENOUS

## 2023-12-15 ENCOUNTER — Ambulatory Visit: Payer: Self-pay

## 2023-12-15 ENCOUNTER — Encounter: Payer: Self-pay | Admitting: Hematology and Oncology

## 2023-12-25 ENCOUNTER — Encounter: Payer: Self-pay | Admitting: Adult Health

## 2023-12-25 ENCOUNTER — Inpatient Hospital Stay: Payer: Self-pay | Attending: Hematology & Oncology | Admitting: Adult Health

## 2023-12-25 DIAGNOSIS — C50211 Malignant neoplasm of upper-inner quadrant of right female breast: Secondary | ICD-10-CM

## 2023-12-25 NOTE — Assessment & Plan Note (Signed)
 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 Oncotype DX score 25 11/21/2022: CT CAP: No metastatic disease.  Suspected acute PE left lower lobe 11/21/2022: CT angiogram: No PE, pneumonia 11/27/2022: Bone scan: No metastatic disease.   Treatment plan: Adjuvant chemotherapy with Taxotere  and Cytoxan  every 3 weeks x 4 completed 01/10/2023  Adjuvant antiestrogen therapy with anastrozole  (oophorectomy 02/11/2023).  Ribociclib  added 04/16/2023 ------------------------------------------------------------------------------------------------------------------------------------ Positive Signatera test:  CT CAP 11/21/2022: Suspected acute PE (CT angiogram chest: No PE).  No metastatic disease Bone scan 11/27/2022: Benign 02/11/2023: Laparoscopic BSO 02/28/2023: Bilateral breast implant placement Anastrozole  toxicities: Tolerating it extremely well. Ribociclib  Toxicities: Started 04/16/2023 EKG 05/14/23: QTc: 447 EKG 06/11/2023: QTc 442
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
## 2023-12-26 ENCOUNTER — Other Ambulatory Visit: Payer: Self-pay

## 2023-12-26 DIAGNOSIS — Z1379 Encounter for other screening for genetic and chromosomal anomalies: Secondary | ICD-10-CM

## 2023-12-26 DIAGNOSIS — C50011 Malignant neoplasm of nipple and areola, right female breast: Secondary | ICD-10-CM

## 2023-12-26 DIAGNOSIS — C50211 Malignant neoplasm of upper-inner quadrant of right female breast: Secondary | ICD-10-CM

## 2023-12-26 NOTE — Addendum Note (Signed)
 Addended by: WONDA RAD T on: 12/26/2023 12:52 PM   Modules accepted: Orders

## 2024-01-20 ENCOUNTER — Other Ambulatory Visit: Payer: Self-pay | Admitting: *Deleted

## 2024-01-20 DIAGNOSIS — C50211 Malignant neoplasm of upper-inner quadrant of right female breast: Secondary | ICD-10-CM

## 2024-01-20 NOTE — Progress Notes (Signed)
 Signatera renewal orders placed.

## 2024-01-25 ENCOUNTER — Encounter (INDEPENDENT_AMBULATORY_CARE_PROVIDER_SITE_OTHER): Payer: Self-pay

## 2024-01-29 ENCOUNTER — Other Ambulatory Visit: Payer: Self-pay | Admitting: Hematology and Oncology

## 2024-01-29 LAB — SIGNATERA
SIGNATERA MTM READOUT: 0 MTM/ml
SIGNATERA TEST RESULT: NEGATIVE

## 2024-02-11 ENCOUNTER — Inpatient Hospital Stay: Payer: Self-pay | Admitting: Hematology and Oncology

## 2024-02-11 ENCOUNTER — Inpatient Hospital Stay: Payer: Self-pay | Attending: Hematology & Oncology

## 2024-02-11 VITALS — BP 102/80 | HR 71 | Temp 98.3°F | Resp 18 | Ht 67.0 in | Wt 157.8 lb

## 2024-02-11 DIAGNOSIS — Z1732 Human epidermal growth factor receptor 2 negative status: Secondary | ICD-10-CM | POA: Insufficient documentation

## 2024-02-11 DIAGNOSIS — Z79811 Long term (current) use of aromatase inhibitors: Secondary | ICD-10-CM | POA: Insufficient documentation

## 2024-02-11 DIAGNOSIS — Z170 Estrogen receptor positive status [ER+]: Secondary | ICD-10-CM | POA: Insufficient documentation

## 2024-02-11 DIAGNOSIS — C50211 Malignant neoplasm of upper-inner quadrant of right female breast: Secondary | ICD-10-CM

## 2024-02-11 DIAGNOSIS — Z9013 Acquired absence of bilateral breasts and nipples: Secondary | ICD-10-CM | POA: Insufficient documentation

## 2024-02-11 DIAGNOSIS — Z1379 Encounter for other screening for genetic and chromosomal anomalies: Secondary | ICD-10-CM

## 2024-02-11 DIAGNOSIS — Z1721 Progesterone receptor positive status: Secondary | ICD-10-CM | POA: Insufficient documentation

## 2024-02-11 LAB — CBC WITH DIFFERENTIAL (CANCER CENTER ONLY)
Abs Immature Granulocytes: 0 K/uL (ref 0.00–0.07)
Basophils Absolute: 0 K/uL (ref 0.0–0.1)
Basophils Relative: 2 %
Eosinophils Absolute: 0.2 K/uL (ref 0.0–0.5)
Eosinophils Relative: 7 %
HCT: 39.2 % (ref 36.0–46.0)
Hemoglobin: 13.7 g/dL (ref 12.0–15.0)
Immature Granulocytes: 0 %
Lymphocytes Relative: 38 %
Lymphs Abs: 1 K/uL (ref 0.7–4.0)
MCH: 31.1 pg (ref 26.0–34.0)
MCHC: 34.9 g/dL (ref 30.0–36.0)
MCV: 89.1 fL (ref 80.0–100.0)
Monocytes Absolute: 0.2 K/uL (ref 0.1–1.0)
Monocytes Relative: 8 %
Neutro Abs: 1.2 K/uL — ABNORMAL LOW (ref 1.7–7.7)
Neutrophils Relative %: 45 %
Platelet Count: 274 K/uL (ref 150–400)
RBC: 4.4 MIL/uL (ref 3.87–5.11)
RDW: 12.2 % (ref 11.5–15.5)
WBC Count: 2.6 K/uL — ABNORMAL LOW (ref 4.0–10.5)
nRBC: 0 % (ref 0.0–0.2)

## 2024-02-11 LAB — CMP (CANCER CENTER ONLY)
ALT: 15 U/L (ref 0–44)
AST: 17 U/L (ref 15–41)
Albumin: 4.5 g/dL (ref 3.5–5.0)
Alkaline Phosphatase: 69 U/L (ref 38–126)
Anion gap: 9 (ref 5–15)
BUN: 17 mg/dL (ref 6–20)
CO2: 27 mmol/L (ref 22–32)
Calcium: 9.2 mg/dL (ref 8.9–10.3)
Chloride: 105 mmol/L (ref 98–111)
Creatinine: 0.87 mg/dL (ref 0.44–1.00)
GFR, Estimated: >60 mL/min (ref >60)
Glucose, Bld: 96 mg/dL (ref 70–99)
Potassium: 4 mmol/L (ref 3.5–5.1)
Sodium: 140 mmol/L (ref 135–145)
Total Bilirubin: 0.4 mg/dL (ref 0.0–1.2)
Total Protein: 6.9 g/dL (ref 6.5–8.1)

## 2024-02-11 LAB — MAGNESIUM: Magnesium: 2 mg/dL (ref 1.7–2.4)

## 2024-02-11 LAB — PHOSPHORUS: Phosphorus: 3.6 mg/dL (ref 2.5–4.6)

## 2024-02-11 NOTE — Progress Notes (Signed)
 Patient Care Team: Sun, [name], MD as PCP - General (Family Medicine) Odean Potts, MD as Consulting Physician (Hematology and Oncology) Belinda Cough, MD as Consulting Physician (General Surgery) Dewey Rush, MD as Consulting Physician (Radiation Oncology)  DIAGNOSIS:  Encounter Diagnosis  Name Primary?   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 Report date is 09/17/2022.  The CancerNext-Expanded gene panel offered by [facility] and includes sequencing, rearrangement, and RNA analysis for the following 71 genes: AIP, ALK, APC, ATM, AXIN2, BAP1, BARD1, BMPR1A, BRCA1, BRCA2, BRIP1, CDC73, CDH1, CDK4, CDKN1B, CDKN2A, CHEK2, CTNNA1, DICER1, FH, FLCN, KIF1B, LZTR1, MAX, MEN1, MET, MLH1, MSH2, MSH3, MSH6, MUTYH, NF1, NF2, NTHL1, PALB2, PHOX2B, PMS2, POT1, PRKAR1A, PTCH1, PTEN, RAD51C, RAD51D, RB1, RET, SDHA, SDHAF2, SDHB, SDHC, SDHD, SMAD4, SMARCA4, SMARCB1, SMARCE1, STK11, SUFU, TMEM127, TP53, TSC1, TSC2, and VHL (sequencing and deletion/duplication); EGFR, EGLN1, HOXB13, KIT, MITF, PDGFRA, POLD1, and POLE (sequencing only); EPCAM and GREM1 (deletion/duplication only).    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 Oncotype DX score 25   11/08/2022 - 01/13/2023 Chemotherapy   Patient is on Treatment Plan : BREAST TC q21d     01/2023 -  Anti-estrogen oral therapy   Anastrozole  x 7 years   04/02/2023 Cancer Staging   Staging form: Breast, AJCC 8th Edition - Pathologic: Stage IB (pT2, pN0, cM0, G3, ER+, PR+, HER2-, Oncotype DX score: 25) - Signed by Crawford Morna Pickle, NP on 04/02/2023 Multigene prognostic tests performed: Oncotype DX Recurrence score range: Greater than or equal to 11 Histologic grading system: 3 grade system     CHIEF COMPLIANT: Follow-up on Ribociclib   HISTORY OF PRESENT ILLNESS:  History of Present Illness Rachel Singleton is a 43 year old female on Kisqali for cancer treatment who presents for routine follow-up and lab review.  She has been on Kisqali since January and is tolerating treatment without increased fatigue or other new symptoms.  Current labs show an ANC of 1.2 with otherwise normal white blood cell count, hemoglobin, platelets, electrolytes, magnesium, and liver and kidney function.  She is monitored with serial Signatera testing, and her last two results have been negative.     ALLERGIES:  is allergic to aluminum-containing compounds.  MEDICATIONS:  Current Outpatient Medications  Medication Sig Dispense Refill   anastrozole  (ARIMIDEX ) 1 MG tablet TAKE 1 TABLET BY MOUTH EVERY DAY 90 tablet 3   Cholecalciferol (VITAMIN D3) 50 MCG (2000 UT) TABS Take 2,000 Int'l Units by mouth daily.     ribociclib  succ (KISQALI 400MG  DAILY DOSE) 200 MG Therapy Pack Take 2 tablets (400 mg total) by mouth daily. Take for 21 days on, 7 days  off, repeat every 28 days. 42 tablet 3   No current facility-administered medications for this visit.    PHYSICAL EXAMINATION: ECOG PERFORMANCE STATUS: 1 -  Symptomatic but completely ambulatory  Vitals:   02/11/24 0837  BP: 102/80  Pulse: 71  Resp: 18  Temp: 98.3 F (36.8 C)  SpO2: 99%   Filed Weights   02/11/24 0837  Weight: 157 lb 12.8 oz (71.6 kg)      LABORATORY DATA:  I have reviewed the data as listed    Latest Ref Rng & Units 02/11/2024    8:23 AM 11/12/2023    8:00 AM 08/06/2023    9:47 AM  CMP  Glucose 70 - 99 mg/dL 96  70  73   BUN 6 - 20 mg/dL 17  16  21    Creatinine 0.44 - 1.00 mg/dL 9.12  9.06  9.04   Sodium 135 - 145 mmol/L 140  142  142   Potassium 3.5 - 5.1 mmol/L 4.0  3.9  4.5   Chloride 98 - 111 mmol/L 105  105  103   CO2 22 - 32 mmol/L 27  31  33   Calcium 8.9 - 10.3 mg/dL 9.2  9.1  9.2   Total Protein 6.5 - 8.1 g/dL 6.9  6.7  6.9   Total Bilirubin 0.0 - 1.2 mg/dL 0.4  0.7  0.5   Alkaline Phos 38 - 126 U/L 69  48  47   AST 15 - 41 U/L 17  16  16    ALT 0 - 44 U/L 15  16  16      Lab Results  Component Value Date   WBC 2.6 (L) 02/11/2024   HGB 13.7 02/11/2024   HCT 39.2 02/11/2024   MCV 89.1 02/11/2024   PLT 274 02/11/2024   NEUTROABS 1.2 (L) 02/11/2024    ASSESSMENT & PLAN:  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 Oncotype DX score 25 11/21/2022: CT CAP: No metastatic disease.  Suspected acute PE left lower lobe 11/21/2022: CT angiogram: No PE, pneumonia 11/27/2022: Bone scan: No metastatic disease.   Treatment plan: Adjuvant chemotherapy with Taxotere  and Cytoxan  every 3 weeks x 4 completed 01/10/2023  Adjuvant antiestrogen therapy with anastrozole  (oophorectomy 02/11/2023).  Ribociclib  added  04/16/2023 ------------------------------------------------------------------------------------------------------------------------------------ Positive Signatera test:  CT CAP 11/21/2022: Suspected acute PE (CT angiogram chest: No PE).  No metastatic disease Bone scan 11/27/2022: Benign 02/11/2023: Laparoscopic BSO 02/28/2023: Bilateral breast implant placement 01/29/2024: Repeat Signatera: Negative   Anastrozole  toxicities: Tolerating it extremely well. Breast cancer surveillance: Signatera: Negative   Ribociclib  Toxicities: Started 04/16/2023 Tolerating Ribociclib  extremely well.  Denies any fatigue or nausea or any GI disturbances. EKG 05/14/23: QTc: 447 EKG 06/11/2023: QTc 442 Return to clinic in 3 month with labs and follow-up     No orders of the defined types were placed in this encounter.  The patient has a good understanding of the overall plan. she agrees with it. she will call with any problems that may develop before the next visit here.  I personally spent a total of 30 minutes in the care of the patient today including preparing to see the patient, getting/reviewing separately obtained history, performing a medically appropriate exam/evaluation, counseling and educating, placing orders, referring and communicating with other health care professionals, documenting clinical information  in the EHR, independently interpreting results, communicating results, and coordinating care.   Viinay K Kennith Morss, MD 02/11/24

## 2024-02-11 NOTE — Assessment & Plan Note (Signed)
 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 Oncotype DX score 25 11/21/2022: CT CAP: No metastatic disease.  Suspected acute PE left lower lobe 11/21/2022: CT angiogram: No PE, pneumonia 11/27/2022: Bone scan: No metastatic disease.   Treatment plan: Adjuvant chemotherapy with Taxotere  and Cytoxan  every 3 weeks x 4 completed 01/10/2023  Adjuvant antiestrogen therapy with anastrozole  (oophorectomy 02/11/2023).  Ribociclib  added 04/16/2023 ------------------------------------------------------------------------------------------------------------------------------------ Positive Signatera test:  CT CAP 11/21/2022: Suspected acute PE (CT angiogram chest: No PE).  No metastatic disease Bone scan 11/27/2022: Benign 02/11/2023: Laparoscopic BSO 02/28/2023: Bilateral breast implant placement 01/29/2024: Repeat Signatera: Negative   Anastrozole  toxicities: Tolerating it extremely well. Breast cancer surveillance: Signatera: Negative   Ribociclib  Toxicities: Started 04/16/2023 Tolerating Ribociclib  extremely well.  Denies any fatigue or nausea or any GI disturbances. EKG 05/14/23: QTc: 447 EKG 06/11/2023: QTc 442 Return to clinic in 3 month with labs and EKG and follow-up

## 2024-03-02 ENCOUNTER — Other Ambulatory Visit: Payer: Self-pay | Admitting: *Deleted

## 2024-03-02 MED ORDER — RIBOCICLIB SUCC (400 MG DOSE) 200 MG PO TBPK: 400.0000 mg | ORAL_TABLET | Freq: Every day | ORAL | 3 refills | Status: AC

## 2024-03-05 ENCOUNTER — Encounter: Payer: Self-pay | Admitting: Hematology and Oncology

## 2024-03-08 ENCOUNTER — Other Ambulatory Visit (HOSPITAL_COMMUNITY): Payer: Self-pay

## 2024-03-09 ENCOUNTER — Other Ambulatory Visit (HOSPITAL_COMMUNITY): Payer: Self-pay

## 2024-03-12 ENCOUNTER — Telehealth: Payer: Self-pay

## 2024-03-12 NOTE — Telephone Encounter (Addendum)
 PAP reenrollment for year 2026     Application has been submitted for Kisqali through [employer] with both Patient and doctor signatures, along with requested documentation.  Status: Pending, 03/16/2024 [employer] faxed request  most recent tax return to continue processing application.    Application Denied 03/31/2024 over income limits (see document)  Patient has been notified via MyChart  Charlott Hamilton,  CPhT-Adv  she/her/hers [place]  [facility] Pharmacy Technician [facility] Phone: [phone]  Fax: [phone] [email or web address]

## 2024-03-24 ENCOUNTER — Other Ambulatory Visit (HOSPITAL_COMMUNITY): Payer: Self-pay

## 2024-03-25 ENCOUNTER — Other Ambulatory Visit (HOSPITAL_COMMUNITY): Payer: Self-pay

## 2024-03-25 ENCOUNTER — Other Ambulatory Visit: Payer: Self-pay

## 2024-03-26 ENCOUNTER — Other Ambulatory Visit (HOSPITAL_COMMUNITY): Payer: Self-pay

## 2024-03-29 ENCOUNTER — Other Ambulatory Visit: Payer: Self-pay | Admitting: Pharmacist

## 2024-03-29 MED ORDER — RIBOCICLIB SUCC (400 MG DOSE) 200 MG PO TBPK
400.0000 mg | ORAL_TABLET | Freq: Every day | ORAL | 0 refills | Status: AC
  Filled 2024-04-08: qty 42, 28d supply, fill #0

## 2024-04-08 ENCOUNTER — Other Ambulatory Visit: Payer: Self-pay

## 2024-04-08 NOTE — Progress Notes (Signed)
 Specialty Pharmacy Initial Fill Coordination Note  Rachel Singleton is a 44 y.o. female contacted today regarding refills of specialty medication(s) Ribociclib  Succinate (KISQALI  400mg  Daily Dose) .  Patient requested Delivery  on 04/12/24  to verified address Patient address [address]  [place] [address]   Medication will be filled on 04/09/2024.   Patient is aware of $0 copayment.  Use free trial card this is a 1 time fill until patient and Dr. Damien discuss at her next appointment

## 2024-04-08 NOTE — Progress Notes (Signed)
 Patient will be obtaining only one month of ribociclib  through [facility]. Patient initially counseled in clinic visit note on 04/23/23.

## 2024-04-09 ENCOUNTER — Other Ambulatory Visit: Payer: Self-pay

## 2024-04-12 ENCOUNTER — Other Ambulatory Visit: Payer: Self-pay

## 2024-04-20 ENCOUNTER — Other Ambulatory Visit: Payer: Self-pay

## 2024-04-20 NOTE — Progress Notes (Signed)
 One Time Fill with free trial card until PAP re-enrollment for 2026

## 2024-04-21 ENCOUNTER — Telehealth: Payer: Self-pay | Admitting: Hematology and Oncology

## 2024-04-21 NOTE — Telephone Encounter (Signed)
 left vm for pt about update to appt 2/26. asked to call back with question/concerns

## 2024-05-13 ENCOUNTER — Inpatient Hospital Stay: Payer: Self-pay | Admitting: Adult Health

## 2024-05-13 ENCOUNTER — Inpatient Hospital Stay: Payer: Self-pay | Attending: Hematology & Oncology
